# Patient Record
Sex: Male | Born: 1950
Health system: Southern US, Community
[De-identification: ages and names within clinical notes are randomized; demographics above are authoritative.]

## PROBLEM LIST (undated history)

## (undated) DIAGNOSIS — I272 Pulmonary hypertension, unspecified: Secondary | ICD-10-CM

## (undated) DIAGNOSIS — R600 Localized edema: Secondary | ICD-10-CM

## (undated) DIAGNOSIS — Z872 Personal history of diseases of the skin and subcutaneous tissue: Secondary | ICD-10-CM

## (undated) DIAGNOSIS — K219 Gastro-esophageal reflux disease without esophagitis: Secondary | ICD-10-CM

## (undated) DIAGNOSIS — I4891 Unspecified atrial fibrillation: Secondary | ICD-10-CM

## (undated) DIAGNOSIS — N189 Chronic kidney disease, unspecified: Secondary | ICD-10-CM

## (undated) DIAGNOSIS — Z86711 Personal history of pulmonary embolism: Secondary | ICD-10-CM

## (undated) HISTORY — PX: CATARACT EXTRACTION: SUR2

---

## 2015-05-09 ENCOUNTER — Inpatient Hospital Stay (HOSPITAL_COMMUNITY)
Admission: EM | Admit: 2015-05-09 | Discharge: 2015-05-23 | DRG: 871 | Disposition: A | Discharge status: Home / Self Care | Payer: Self-pay | Attending: Internal Medicine | Admitting: Internal Medicine

## 2015-05-09 ENCOUNTER — Encounter (HOSPITAL_COMMUNITY): Payer: Self-pay | Admitting: *Deleted

## 2015-05-09 ENCOUNTER — Inpatient Hospital Stay (HOSPITAL_COMMUNITY): Payer: Self-pay

## 2015-05-09 ENCOUNTER — Emergency Department (HOSPITAL_COMMUNITY): Payer: Self-pay

## 2015-05-09 DIAGNOSIS — K5660 Unspecified intestinal obstruction: Secondary | ICD-10-CM | POA: Diagnosis present

## 2015-05-09 DIAGNOSIS — N19 Unspecified kidney failure: Secondary | ICD-10-CM

## 2015-05-09 DIAGNOSIS — R1114 Bilious vomiting: Secondary | ICD-10-CM

## 2015-05-09 DIAGNOSIS — I959 Hypotension, unspecified: Secondary | ICD-10-CM | POA: Diagnosis present

## 2015-05-09 DIAGNOSIS — F4323 Adjustment disorder with mixed anxiety and depressed mood: Secondary | ICD-10-CM | POA: Diagnosis not present

## 2015-05-09 DIAGNOSIS — T68XXXA Hypothermia, initial encounter: Secondary | ICD-10-CM | POA: Diagnosis present

## 2015-05-09 DIAGNOSIS — R6521 Severe sepsis with septic shock: Secondary | ICD-10-CM | POA: Diagnosis present

## 2015-05-09 DIAGNOSIS — R001 Bradycardia, unspecified: Secondary | ICD-10-CM | POA: Diagnosis present

## 2015-05-09 DIAGNOSIS — L0390 Cellulitis, unspecified: Secondary | ICD-10-CM | POA: Diagnosis present

## 2015-05-09 DIAGNOSIS — R55 Syncope and collapse: Secondary | ICD-10-CM | POA: Diagnosis present

## 2015-05-09 DIAGNOSIS — D649 Anemia, unspecified: Secondary | ICD-10-CM | POA: Diagnosis present

## 2015-05-09 DIAGNOSIS — E871 Hypo-osmolality and hyponatremia: Secondary | ICD-10-CM | POA: Diagnosis present

## 2015-05-09 DIAGNOSIS — K56609 Unspecified intestinal obstruction, unspecified as to partial versus complete obstruction: Secondary | ICD-10-CM

## 2015-05-09 DIAGNOSIS — Z6841 Body Mass Index (BMI) 40.0 and over, adult: Secondary | ICD-10-CM

## 2015-05-09 DIAGNOSIS — E872 Acidosis, unspecified: Secondary | ICD-10-CM | POA: Diagnosis present

## 2015-05-09 DIAGNOSIS — E876 Hypokalemia: Secondary | ICD-10-CM | POA: Diagnosis not present

## 2015-05-09 DIAGNOSIS — L03115 Cellulitis of right lower limb: Secondary | ICD-10-CM | POA: Diagnosis present

## 2015-05-09 DIAGNOSIS — E875 Hyperkalemia: Secondary | ICD-10-CM | POA: Diagnosis present

## 2015-05-09 DIAGNOSIS — Z0189 Encounter for other specified special examinations: Secondary | ICD-10-CM

## 2015-05-09 DIAGNOSIS — R112 Nausea with vomiting, unspecified: Secondary | ICD-10-CM

## 2015-05-09 DIAGNOSIS — R7989 Other specified abnormal findings of blood chemistry: Secondary | ICD-10-CM

## 2015-05-09 DIAGNOSIS — I9589 Other hypotension: Secondary | ICD-10-CM

## 2015-05-09 DIAGNOSIS — Z452 Encounter for adjustment and management of vascular access device: Secondary | ICD-10-CM

## 2015-05-09 DIAGNOSIS — J9600 Acute respiratory failure, unspecified whether with hypoxia or hypercapnia: Secondary | ICD-10-CM

## 2015-05-09 DIAGNOSIS — I517 Cardiomegaly: Secondary | ICD-10-CM | POA: Diagnosis present

## 2015-05-09 DIAGNOSIS — I248 Other forms of acute ischemic heart disease: Secondary | ICD-10-CM | POA: Diagnosis present

## 2015-05-09 DIAGNOSIS — K219 Gastro-esophageal reflux disease without esophagitis: Secondary | ICD-10-CM | POA: Diagnosis present

## 2015-05-09 DIAGNOSIS — N179 Acute kidney failure, unspecified: Secondary | ICD-10-CM

## 2015-05-09 DIAGNOSIS — Z22322 Carrier or suspected carrier of Methicillin resistant Staphylococcus aureus: Secondary | ICD-10-CM

## 2015-05-09 DIAGNOSIS — E870 Hyperosmolality and hypernatremia: Secondary | ICD-10-CM | POA: Diagnosis present

## 2015-05-09 DIAGNOSIS — I4891 Unspecified atrial fibrillation: Secondary | ICD-10-CM | POA: Diagnosis not present

## 2015-05-09 DIAGNOSIS — R778 Other specified abnormalities of plasma proteins: Secondary | ICD-10-CM | POA: Diagnosis present

## 2015-05-09 DIAGNOSIS — R45851 Suicidal ideations: Secondary | ICD-10-CM | POA: Diagnosis not present

## 2015-05-09 DIAGNOSIS — R680 Hypothermia, not associated with low environmental temperature: Secondary | ICD-10-CM | POA: Diagnosis present

## 2015-05-09 DIAGNOSIS — E861 Hypovolemia: Secondary | ICD-10-CM | POA: Diagnosis present

## 2015-05-09 DIAGNOSIS — N170 Acute kidney failure with tubular necrosis: Secondary | ICD-10-CM | POA: Diagnosis present

## 2015-05-09 DIAGNOSIS — A419 Sepsis, unspecified organism: Principal | ICD-10-CM | POA: Diagnosis present

## 2015-05-09 DIAGNOSIS — L03119 Cellulitis of unspecified part of limb: Secondary | ICD-10-CM

## 2015-05-09 DIAGNOSIS — I82403 Acute embolism and thrombosis of unspecified deep veins of lower extremity, bilateral: Secondary | ICD-10-CM

## 2015-05-09 DIAGNOSIS — F322 Major depressive disorder, single episode, severe without psychotic features: Secondary | ICD-10-CM | POA: Clinically undetermined

## 2015-05-09 DIAGNOSIS — K567 Ileus, unspecified: Secondary | ICD-10-CM | POA: Diagnosis present

## 2015-05-09 HISTORY — DX: Gastro-esophageal reflux disease without esophagitis: K21.9

## 2015-05-09 LAB — CBG MONITORING, ED
Glucose-Capillary: 122 mg/dL — ABNORMAL HIGH (ref 65–99)
Glucose-Capillary: 116 mg/dL — ABNORMAL HIGH (ref 65–99)

## 2015-05-09 LAB — LIPID PANEL
CHOL/HDL RATIO: 4.8 ratio
CHOLESTEROL: 121 mg/dL (ref 0–200)
HDL: 25 mg/dL — AB (ref >40)
LDL Cholesterol: 68 mg/dL (ref 0–99)
TRIGLYCERIDES: 142 mg/dL (ref <150)
VLDL: 28 mg/dL (ref 0–40)

## 2015-05-09 LAB — BASIC METABOLIC PANEL
Anion gap: 13 (ref 5–15)
Anion gap: 13 (ref 5–15)
Anion gap: 12 (ref 5–15)
Anion gap: 11 (ref 5–15)
Anion gap: 12 (ref 5–15)
BUN: 158 mg/dL — AB (ref 6–20)
BUN: 147 mg/dL — ABNORMAL HIGH (ref 6–20)
BUN: 144 mg/dL — ABNORMAL HIGH (ref 6–20)
BUN: 137 mg/dL — ABNORMAL HIGH (ref 6–20)
BUN: 136 mg/dL — AB (ref 6–20)
CALCIUM: 9.5 mg/dL (ref 8.9–10.3)
CALCIUM: 8.5 mg/dL — AB (ref 8.9–10.3)
CALCIUM: 8.8 mg/dL — AB (ref 8.9–10.3)
CALCIUM: 8.9 mg/dL (ref 8.9–10.3)
CHLORIDE: 91 mmol/L — AB (ref 101–111)
CO2: 19 mmol/L — ABNORMAL LOW (ref 22–32)
CO2: 18 mmol/L — ABNORMAL LOW (ref 22–32)
CO2: 20 mmol/L — AB (ref 22–32)
CO2: 20 mmol/L — AB (ref 22–32)
CO2: 21 mmol/L — AB (ref 22–32)
CREATININE: 4.76 mg/dL — AB (ref 0.61–1.24)
CREATININE: 4.58 mg/dL — AB (ref 0.61–1.24)
CREATININE: 4.46 mg/dL — AB (ref 0.61–1.24)
Calcium: 8.3 mg/dL — ABNORMAL LOW (ref 8.9–10.3)
Chloride: 79 mmol/L — ABNORMAL LOW (ref 101–111)
Chloride: 90 mmol/L — ABNORMAL LOW (ref 101–111)
Chloride: 89 mmol/L — ABNORMAL LOW (ref 101–111)
Chloride: 87 mmol/L — ABNORMAL LOW (ref 101–111)
Creatinine, Ser: 5.82 mg/dL — ABNORMAL HIGH (ref 0.61–1.24)
Creatinine, Ser: 5.03 mg/dL — ABNORMAL HIGH (ref 0.61–1.24)
GFR calc Af Amer: 11 mL/min — ABNORMAL LOW (ref >60)
GFR calc non Af Amer: 11 mL/min — ABNORMAL LOW (ref >60)
GFR calc non Af Amer: 12 mL/min — ABNORMAL LOW (ref >60)
GFR calc non Af Amer: 12 mL/min — ABNORMAL LOW (ref >60)
GFR calc non Af Amer: 13 mL/min — ABNORMAL LOW (ref >60)
GFR, EST AFRICAN AMERICAN: 13 mL/min — AB (ref >60)
GFR, EST AFRICAN AMERICAN: 14 mL/min — AB (ref >60)
GFR, EST AFRICAN AMERICAN: 14 mL/min — AB (ref >60)
GFR, EST AFRICAN AMERICAN: 15 mL/min — AB (ref >60)
GFR, EST NON AFRICAN AMERICAN: 9 mL/min — AB (ref >60)
GLUCOSE: 123 mg/dL — AB (ref 65–99)
GLUCOSE: 178 mg/dL — AB (ref 65–99)
GLUCOSE: 188 mg/dL — AB (ref 65–99)
Glucose, Bld: 95 mg/dL (ref 65–99)
Glucose, Bld: 124 mg/dL — ABNORMAL HIGH (ref 65–99)
POTASSIUM: 5.1 mmol/L (ref 3.5–5.1)
Potassium: 7.3 mmol/L (ref 3.5–5.1)
Potassium: 4.4 mmol/L (ref 3.5–5.1)
Potassium: 4.4 mmol/L (ref 3.5–5.1)
Potassium: 4.7 mmol/L (ref 3.5–5.1)
SODIUM: 111 mmol/L — AB (ref 135–145)
SODIUM: 122 mmol/L — AB (ref 135–145)
Sodium: 122 mmol/L — ABNORMAL LOW (ref 135–145)
Sodium: 120 mmol/L — ABNORMAL LOW (ref 135–145)
Sodium: 120 mmol/L — ABNORMAL LOW (ref 135–145)

## 2015-05-09 LAB — I-STAT CHEM 8, ED
CREATININE: 6.2 mg/dL — AB (ref 0.61–1.24)
Calcium, Ion: 1.12 mmol/L — ABNORMAL LOW (ref 1.13–1.30)
Chloride: 80 mmol/L — ABNORMAL LOW (ref 101–111)
GLUCOSE: 113 mg/dL — AB (ref 65–99)
HCT: 41 % (ref 39.0–52.0)
HEMOGLOBIN: 13.9 g/dL (ref 13.0–17.0)
Potassium: 6.9 mmol/L (ref 3.5–5.1)
Sodium: 109 mmol/L — CL (ref 135–145)
TCO2: 21 mmol/L (ref 0–100)

## 2015-05-09 LAB — URINALYSIS, ROUTINE W REFLEX MICROSCOPIC
Bilirubin Urine: NEGATIVE
GLUCOSE, UA: NEGATIVE mg/dL
HGB URINE DIPSTICK: NEGATIVE
Ketones, ur: NEGATIVE mg/dL
Nitrite: NEGATIVE
PH: 5 (ref 5.0–8.0)
Protein, ur: NEGATIVE mg/dL
SPECIFIC GRAVITY, URINE: 1.009 (ref 1.005–1.030)

## 2015-05-09 LAB — I-STAT TROPONIN, ED
TROPONIN I, POC: 0.09 ng/mL — AB (ref 0.00–0.08)
TROPONIN I, POC: 0.09 ng/mL — AB (ref 0.00–0.08)

## 2015-05-09 LAB — CBC
HEMATOCRIT: 35.9 % — AB (ref 39.0–52.0)
HEMOGLOBIN: 12.6 g/dL — AB (ref 13.0–17.0)
MCH: 30.7 pg (ref 26.0–34.0)
MCHC: 35.1 g/dL (ref 30.0–36.0)
MCV: 87.6 fL (ref 78.0–100.0)
PLATELETS: 306 10*3/uL (ref 150–400)
RBC: 4.1 MIL/uL — AB (ref 4.22–5.81)
RDW: 13.1 % (ref 11.5–15.5)
WBC: 11.5 10*3/uL — ABNORMAL HIGH (ref 4.0–10.5)

## 2015-05-09 LAB — LACTIC ACID, PLASMA
Lactic Acid, Venous: 2.5 mmol/L (ref 0.5–2.0)
Lactic Acid, Venous: 1.6 mmol/L (ref 0.5–2.0)

## 2015-05-09 LAB — URINE MICROSCOPIC-ADD ON

## 2015-05-09 LAB — OSMOLALITY: Osmolality: 294 mOsm/kg (ref 275–295)

## 2015-05-09 LAB — TROPONIN I
Troponin I: 0.08 ng/mL — ABNORMAL HIGH (ref <0.031)
Troponin I: 0.07 ng/mL — ABNORMAL HIGH (ref <0.031)

## 2015-05-09 LAB — I-STAT CG4 LACTIC ACID, ED
LACTIC ACID, VENOUS: 3.18 mmol/L — AB (ref 0.5–2.0)
Lactic Acid, Venous: 2.49 mmol/L (ref 0.5–2.0)

## 2015-05-09 LAB — PROCALCITONIN: Procalcitonin: 0.19 ng/mL

## 2015-05-09 LAB — MRSA PCR SCREENING: MRSA by PCR: POSITIVE — AB

## 2015-05-09 LAB — OSMOLALITY, URINE: Osmolality, Ur: 299 mOsm/kg — ABNORMAL LOW (ref 300–900)

## 2015-05-09 LAB — SODIUM, URINE, RANDOM: SODIUM UR: 24 mmol/L

## 2015-05-09 LAB — CREATININE, URINE, RANDOM: CREATININE, URINE: 72.98 mg/dL

## 2015-05-09 MED ORDER — CHLORHEXIDINE GLUCONATE CLOTH 2 % EX PADS
6.0000 | MEDICATED_PAD | Freq: Every day | CUTANEOUS | Status: DC
  Administered 2015-05-10 – 2015-05-14 (×4): 6 via TOPICAL

## 2015-05-09 MED ORDER — DEXTROSE 5 % IV SOLN
INTRAVENOUS | Status: DC
  Administered 2015-05-09: 17:00:00 via INTRAVENOUS

## 2015-05-09 MED ORDER — VANCOMYCIN HCL 10 G IV SOLR
2000.0000 mg | Freq: Once | INTRAVENOUS | Status: DC
  Administered 2015-05-09: 2000 mg via INTRAVENOUS
  Filled 2015-05-09: qty 2000

## 2015-05-09 MED ORDER — FUROSEMIDE 10 MG/ML IJ SOLN
40.0000 mg | Freq: Once | INTRAMUSCULAR | Status: AC
  Administered 2015-05-09: 40 mg via INTRAVENOUS
  Filled 2015-05-09: qty 4

## 2015-05-09 MED ORDER — MUPIROCIN 2 % EX OINT
1.0000 "application " | TOPICAL_OINTMENT | Freq: Two times a day (BID) | CUTANEOUS | Status: AC
  Administered 2015-05-09 – 2015-05-14 (×10): 1 via NASAL
  Filled 2015-05-09: qty 22

## 2015-05-09 MED ORDER — MAGNESIUM HYDROXIDE 400 MG/5ML PO SUSP: 30.0000 mL | Freq: Once | ORAL | Status: DC

## 2015-05-09 MED ORDER — ASPIRIN 81 MG PO CHEW
324.0000 mg | CHEWABLE_TABLET | ORAL | Status: AC
  Administered 2015-05-09: 324 mg via ORAL
  Filled 2015-05-09: qty 4

## 2015-05-09 MED ORDER — DEXTROSE 50 % IV SOLN
2.0000 | Freq: Once | INTRAVENOUS | Status: AC
  Administered 2015-05-09: 50 mL via INTRAVENOUS
  Filled 2015-05-09: qty 100

## 2015-05-09 MED ORDER — DEXTROSE 5 % IV SOLN
2.0000 g | INTRAVENOUS | Status: AC
  Administered 2015-05-09 – 2015-05-18 (×10): 2 g via INTRAVENOUS
  Filled 2015-05-09 (×10): qty 2

## 2015-05-09 MED ORDER — PIPERACILLIN-TAZOBACTAM 3.375 G IVPB 30 MIN
3.3750 g | Freq: Once | INTRAVENOUS | Status: AC
  Administered 2015-05-09: 3.375 g via INTRAVENOUS
  Filled 2015-05-09: qty 50

## 2015-05-09 MED ORDER — SODIUM CHLORIDE 0.9 % IV BOLUS (SEPSIS)
500.0000 mL | INTRAVENOUS | Status: DC
  Administered 2015-05-09 (×3): 500 mL via INTRAVENOUS

## 2015-05-09 MED ORDER — SODIUM CHLORIDE 0.9 % IV SOLN: 250.0000 mL | INTRAVENOUS | Status: DC | PRN

## 2015-05-09 MED ORDER — PIPERACILLIN-TAZOBACTAM IN DEX 2-0.25 GM/50ML IV SOLN
2.2500 g | Freq: Four times a day (QID) | INTRAVENOUS | Status: DC
  Filled 2015-05-09: qty 50

## 2015-05-09 MED ORDER — ONDANSETRON HCL 4 MG/2ML IJ SOLN
4.0000 mg | Freq: Four times a day (QID) | INTRAMUSCULAR | Status: DC | PRN
  Administered 2015-05-11 (×3): 4 mg via INTRAVENOUS
  Filled 2015-05-09 (×3): qty 2

## 2015-05-09 MED ORDER — SODIUM POLYSTYRENE SULFONATE 15 GM/60ML PO SUSP
45.0000 g | Freq: Once | ORAL | Status: AC
  Administered 2015-05-09: 45 g via ORAL
  Filled 2015-05-09: qty 180

## 2015-05-09 MED ORDER — ACETAMINOPHEN 325 MG PO TABS
650.0000 mg | ORAL_TABLET | ORAL | Status: DC | PRN
  Administered 2015-05-10 – 2015-05-19 (×4): 650 mg via ORAL
  Filled 2015-05-09 (×4): qty 2

## 2015-05-09 MED ORDER — HEPARIN SODIUM (PORCINE) 5000 UNIT/ML IJ SOLN
5000.0000 [IU] | Freq: Three times a day (TID) | INTRAMUSCULAR | Status: DC
  Administered 2015-05-09 – 2015-05-23 (×41): 5000 [IU] via SUBCUTANEOUS
  Filled 2015-05-09 (×41): qty 1

## 2015-05-09 MED ORDER — ASPIRIN 300 MG RE SUPP: 300.0000 mg | RECTAL | Status: AC

## 2015-05-09 MED ORDER — SODIUM CHLORIDE 0.9 % IV BOLUS (SEPSIS)
1000.0000 mL | INTRAVENOUS | Status: DC
  Administered 2015-05-09 (×3): 1000 mL via INTRAVENOUS

## 2015-05-09 MED ORDER — PANTOPRAZOLE SODIUM 40 MG IV SOLR
40.0000 mg | INTRAVENOUS | Status: DC
  Administered 2015-05-09: 40 mg via INTRAVENOUS
  Filled 2015-05-09: qty 40

## 2015-05-09 MED ORDER — INSULIN ASPART 100 UNIT/ML IV SOLN
10.0000 [IU] | Freq: Once | INTRAVENOUS | Status: AC
  Administered 2015-05-09: 10 [IU] via INTRAVENOUS
  Filled 2015-05-09: qty 1

## 2015-05-09 MED ORDER — ALUM & MAG HYDROXIDE-SIMETH 200-200-20 MG/5ML PO SUSP
30.0000 mL | Freq: Once | ORAL | Status: AC
  Administered 2015-05-09: 30 mL via ORAL
  Filled 2015-05-09: qty 30

## 2015-05-09 MED ORDER — SODIUM CHLORIDE 0.9 % IV SOLN
INTRAVENOUS | Status: DC
  Administered 2015-05-09: 100 mL/h via INTRAVENOUS

## 2015-05-09 MED ORDER — SODIUM CHLORIDE 0.9 % IV SOLN
INTRAVENOUS | Status: DC
  Administered 2015-05-09: 18:00:00 via INTRAVENOUS

## 2015-05-09 MED ORDER — SODIUM CHLORIDE 0.9 % IV SOLN
2.0000 g | INTRAVENOUS | Status: AC
  Administered 2015-05-09: 2 g via INTRAVENOUS
  Filled 2015-05-09: qty 20

## 2015-05-09 NOTE — Progress Notes (Signed)
CSW has made APS report to [place] re: pt's father, Brayten Langa, who is presently at bedside.  APS has said that they "won't be coming out tonight" but will review referral and f/u as appropriate.  Unfortunately, CSW has reached out to pt's other sons who were unwilling to do anything to assist Mr. Craigo and encouraged an APS referral.  Pt was unable to identify anyone who may be able to assist him with the care of his father.  Unit CSW will be notified to f/u with APS in the am.  CSW discussed situation with Chester Holstein, RN.  Creta Levin, LCSW Evening/ED CSW [phone]

## 2015-05-09 NOTE — ED Notes (Signed)
Placed a 16" foley into pt.was handle very well.urine return flowed well.800cc of urine from foley.

## 2015-05-09 NOTE — Progress Notes (Signed)
CSW received the following numbers from Patient:  Thompson Grayer) [place]- [phone] Jorrel Cripe- [phone]  CSW engaged with [name] [place] who reports that his older brother Toney Reil) is his father's POA and has all of his money and Mr. Yonker reports that his father doesn't want to have anything to do with him at this time and he wants to grant his father his wishes. He reports that he does not have a contact number or address for [employer] CSW contacted Mr. Toney Reil who reports that he is out of town and would be unable to come and get him and suggested that social services get involved. CSW will staff with night shift ED social worker for APS report.   [name] [facility] ED/ [place] Social Worker [phone]

## 2015-05-09 NOTE — H&P (Signed)
PULMONARY / CRITICAL CARE MEDICINE   Name: Scott Shelton MRN: [id number] DOB: 30-Mar-1951    ADMISSION DATE:  05/09/2015 CONSULTATION DATE:  05/09/2015  REFERRING MD:  Dr. Claudine Mouton EDP  CHIEF COMPLAINT:  syncope  HISTORY OF PRESENT ILLNESS:   65 year old male with no significant past medical history. He has neglected his own medical care as he is the primary caregiver for his father who has advanced dementia. His health for the most part has been "pretty good" with the exception of a cataract surgery. About one year ago he developed small red punctate lesions to bilateral lower extremities from the calves down. These lesions began to spread and overwhelmed his the entirety of this area. He managed this himself without medical attention, but it seems like he has not been doing much for them. Over the past few weeks he has complained on increased DOE with simple tasks that would have been easy for him before. Denies cough, chest pain, and SOB at rest. 1/31 he was standing next to the couch suffered a syncopal episode during which he fell back onto the couch into a seated position. He awoke spontaneously about 3-4 minutes later. He presented to ED with this complaint. In the emergency department he was found to be hypotensive 94/80 and hypothermic 96.58F. Laboratory evaluation significant for Creatinine 5.82, K 7.3, Na 109, Cl 79. Also had some mild leukocytosis with WBC 11.5. Seen by nephrology in ED, PCCM to admit.  PAST MEDICAL HISTORY :  He  has no past medical history on file.  PAST SURGICAL HISTORY: He  has past surgical history that includes Cataract extraction.  No Known Allergies  No current facility-administered medications on file prior to encounter.   No current outpatient prescriptions on file prior to encounter.    FAMILY HISTORY:  His has no family status information on file.   SOCIAL HISTORY: He  reports that he has never smoked. He does not have any smokeless tobacco history on  file. He reports that he does not drink alcohol or use illicit drugs.  REVIEW OF SYSTEMS:   Bolds are positive  Constitutional: weight loss, gain, night sweats, Fevers, chills, fatigue .  HEENT: headaches, Sore throat, sneezing, nasal congestion, post nasal drip, Difficulty swallowing, Tooth/dental problems, visual complaints visual changes, ear ache CV:  chest pain, radiates: ,Orthopnea, PND, swelling in lower extremities, dizziness, palpitations, syncope.  GI  heartburn, indigestion, abdominal pain, nausea, vomiting, diarrhea, change in bowel habits, loss of appetite, bloody stools.  Resp: cough, productive: , hemoptysis, dyspnea on exertion, chest pain, pleuritic.  Skin: rash or itching or icterus GU: dysuria, change in color of urine, urgency or frequency. flank pain, hematuria  MS: joint pain or swelling. decreased range of motion  Psych: change in mood or affect. depression or anxiety.  Neuro: difficulty with speech, weakness, numbness, ataxia   SUBJECTIVE:    VITAL SIGNS: BP 95/52 mmHg  Pulse 65  Temp(Src) 96.8 F (36 C) (Rectal)  Resp 17  SpO2 99%  HEMODYNAMICS:    VENTILATOR SETTINGS:    INTAKE / OUTPUT:    PHYSICAL EXAMINATION: General:  Obese male in NAD Neuro:  Alert, oriented, non-focal HEENT:  [place]/AT, PERRL, no JVD Cardiovascular:  RRR, no MRG Lungs:  Clear bilateral breath sounds, mildly increased WOB Abdomen:  Soft, non-tender, non-distended Musculoskeletal:  No acute deformity Skin:  LLE edematous and dry. RLE edematous, erythema, areas of ulceration.  LABS:  BMET  Recent Labs Lab 05/09/15 1000 05/09/15 1038  NA  111* 109*  K 7.3* 6.9*  CL 79* 80*  CO2 19*  --   BUN 158* >140*  CREATININE 5.82* 6.20*  GLUCOSE 123* 113*    Electrolytes  Recent Labs Lab 05/09/15 1000  CALCIUM 9.5    CBC  Recent Labs Lab 05/09/15 1000 05/09/15 1038  WBC 11.5*  --   HGB 12.6* 13.9  HCT 35.9* 41.0  PLT 306  --     Coag's No results for  input(s): APTT, INR in the last 168 hours.  Sepsis Markers  Recent Labs Lab 05/09/15 1014  LATICACIDVEN 3.18*    ABG No results for input(s): PHART, PCO2ART, PO2ART in the last 168 hours.  Liver Enzymes No results for input(s): AST, ALT, ALKPHOS, BILITOT, ALBUMIN in the last 168 hours.  Cardiac Enzymes No results for input(s): TROPONINI, PROBNP in the last 168 hours.  Glucose  Recent Labs Lab 05/09/15 1001 05/09/15 1239  GLUCAP 122* 116*    Imaging Dg Chest 2 View  05/09/2015  CLINICAL DATA:  Speech syncope, lightheadedness EXAM: CHEST  2 VIEW COMPARISON:  None. FINDINGS: There is mild elevation of the right diaphragm. There is no focal parenchymal opacity. There is no pleural effusion or pneumothorax. The heart and mediastinal contours are unremarkable. The osseous structures are unremarkable. IMPRESSION: No active cardiopulmonary disease. Electronically Signed   By: Kathreen Devoid   On: 05/09/2015 12:30     STUDIES:  Renal US 1/31 >>>  CULTURES: Blood 1/31 >>> Urine 1/31 >>>  ANTIBIOTICS: Zosyn/Vanc in ED 1/31 Rocephin 1/31 >>>  SIGNIFICANT EVENTS: 1/31 admit  LINES/TUBES:   DISCUSSION: 65 year old male with no PMH. 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 1 year history of BLE edema and wounds. Suffered syncopal episode 1/31 and found to be hypotensive with acute renal failure, hypernatremia, hyperkalemia. Admit to PCCM for medical management, nephrology seeing and helping with metabolic derangements.   ASSESSMENT / PLAN:  PULMONARY A: No acute issues  P:   Supplemental O2 as indicated to keep SpO2 > 92% Incentive spirometry  CARDIOVASCULAR A:  Hypotension - hypovolemia vs severe sepsis  P:  Telemetry Volume resuscitation per nephrology Repeat lactic acid MAP goal > 69mmHg Trend troponin  RENAL A:   Acute renal failure Hyperkalemia (given insulin, calcium, lasix, and kayexalate in  ED) Hyponatremia chronic, hypovolemic NAG acidosis  P:   Nephrology following NS 100/hr (d/w nephro) Serial BMP q 2 hours.  Goal for < 2 mmol/L increase in Na per 2 hours. Correct potassium as indicated Serum/urine osm pending Renal US pending  GASTROINTESTINAL A:   No acute issues  P:   Protonix for SUP  Renal diet  HEMATOLOGIC A:   Anemia in setting renal failure  P:  Follow CBC SQ heparin for VTE ppx  INFECTIOUS A:   Severe sepsis secondary to RLE cellulitis  P:   Cultures and ABX as above (rocephin) PCT algorithm  ENDOCRINE A:   No acute issues  P:   Follow glucose on BMP  NEUROLOGIC A:   No acute issues  P:   RASS goal: 0 Monitor  GLOBAL: Patient has father of whom he is primary caregiver. Father is here in hospital and there is no one to keep and eye on him. Will consult social work.   FAMILY  - Updates: patient updated in ED by [facility], [facility]  - Inter-disciplinary family meet or Palliative Care meeting due by:  2/6  Georgann Housekeeper, AGACNP-BC [place] Pulmonology/Critical Care Pager [phone] or [phone]  05/09/2015 1:27 PM    Attending Note:  I have examined patient, reviewed labs, studies and notes. I have discussed the case with Jaclynn Guarneri, and I agree with the data and plans as amended above. Pt suffered a syncopal episode today. States that he has been progressively weak, has had erythema and swelling in LE's. Brought to ED and found to be hypotensive, labs show metabolic disarray - hyperK, hypoNa, acute renal failure. On exam he has markedly edematous LE's to the knees with redness on the R, suggestive of a cellulitis. he will be admitted to ICU for frequent labs, antibiotics, CAREFUL voilume resuscitation. Independent critical care time is 35 minutes.   Baltazar Apo, MD, PhD 05/09/2015, 5:00 PM [place] [facility] [phone] or if no answer [phone]

## 2015-05-09 NOTE — Progress Notes (Signed)
Pharmacy Antibiotic Note  Scott Shelton is a 65 y.o. male admitted on 05/09/2015 with cellulitis.  Pharmacy has been consulted for vancomycin and zosyn dosing. WBC is elevated and Scr is elevated but unclear baseline.   Plan: - Vanc 2gm IV x 1 then f/u Scr trend to determine appropriate interval - Zosyn 3.375gm IV x 1 then 2.25gm IV Q6H - F/u renal fxn, C&S, clinical status and trough at [facility]     No data recorded.   Recent Labs Lab 05/09/15 1000 05/09/15 1014 05/09/15 1038  WBC 11.5*  --   --   CREATININE 5.82*  --  6.20*  LATICACIDVEN  --  3.18*  --     CrCl cannot be calculated (Unknown ideal weight.).    No Known Allergies  Antimicrobials this admission: Vanc 1/31>> Zosyn 1/31>>  Dose adjustments this admission: N/A  Microbiology results: Pending  Thank you for allowing pharmacy to be a part of this patient's care.  Gino Garrabrant, Rande Lawman 05/09/2015 10:57 AM

## 2015-05-09 NOTE — ED Notes (Addendum)
Pt in from home c/o witnessed 3-4 min syncopal episode with R non radiating CP post syncopal episode , pt denies current CP, reports general Malaise, A&O x4, follows commands, speaks in complete sentences, pt reports SOB with exertion, pt has bil leg swelling with bil wounds, pt reports fever, pt rcvd 324 mg ASA pta

## 2015-05-09 NOTE — Progress Notes (Signed)
CSW attempted brother listed as Patient's emergency contact [name] [phone]) and left voice message. CSW will inquire about other working numbers to try.   Scott Shelton [facility] ED/ [place] Social Worker [phone]

## 2015-05-09 NOTE — Consult Note (Signed)
CARDIOLOGY CONSULT NOTE   Patient ID: Arthur Mickley MRN: [id number] DOB/AGE: December 15, 1950 65 y.o.  Admit date: 05/09/2015  Requesting Physician: [facility] ED Primary Physician   No primary care provider on file. Primary Cardiologist   New Reason for Consultation   syncope  HPI: Aureliano Gosney is a 65 y.o. male with a non contributory past medical history who presented to [facility] on 05/09/15 after a syncopal episode. In the ED he was noted to be hypotensive, hypothermic, hyperkalemic and in acute renal failure with lactic acidosis. Troponin mildly elevated at 0.09.   Patient states that earlier today he was sitting on the couch when tried to reposition himself he passed out for approximately 3-4 minutes.  Notes that he felt fatigue right before he tried to reposition but had no other associated symptoms. Denies palpitations, chest pain, SOB, lightheadedness, nausea, and diaphoresis. After he regained consciousness, he called 911.   Patient lives at home and is the primary caregiver for his father who suffers from dementia. Due to this, the patient has neglected visiting a PCP and is on no medications. 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 Claims that he uses triple antibiotic ointment on his legs for treatment. Patient also notes that for the past three weeks he has been experiencing worsening dyspnea on exertion intermittently. Notes that when he walks from his apartment to his [place]i (approximately 100 feet) he has to stop for a few minutes to catch his breath. Also endorses orthopnea, notes that he sleeps at 90 degrees. Patient admitted further work up and cardiology consulted for syncope.   Currently, patient denies lightheadedness, chest pain, SOB, and palpitations.      Past Medical History  Diagnosis Date  . GERD (gastroesophageal reflux disease)      Past Surgical History    Procedure Laterality Date  . Cataract extraction      No Known Allergies  I have reviewed the patient's current medications . heparin  5,000 Units Subcutaneous 3 times per day  . pantoprazole (PROTONIX) IV  40 mg Intravenous Q24H   . sodium chloride    . sodium chloride 100 mL/hr (05/09/15 1457)  . cefTRIAXone (ROCEPHIN)  IV 2 g (05/09/15 1456)   sodium chloride, acetaminophen, ondansetron (ZOFRAN) IV  Prior to Admission medications   Medication Sig Start Date End Date Taking? Authorizing Provider  aluminum-magnesium hydroxide-simethicone (MAALOX) I037812 MG/5ML SUSP Take 30 mLs by mouth 3 (three) times daily as needed (acid reflux). Acid reflux   Yes Historical Provider, MD     Social History   Social History  . Marital Status: Single    Spouse Name: N/A  . Number of Children: N/A  . Years of Education: N/A   Occupational History  . Not on file.   Social History Main Topics  . Smoking status: Never Smoker   . Smokeless tobacco: Not on file  . Alcohol Use: No  . Drug Use: No  . Sexual Activity: Not on file   Other Topics Concern  . Not on file   Social History Narrative  . No narrative on file    No family status information on file.   No family history on file.   ROS:  Full 14 point review of systems complete and found to be negative unless listed above.  Physical Exam: Blood pressure 98/55, pulse 65, temperature 96.8 F (36 C), temperature source Rectal, resp.  rate 14, SpO2 100 %.  General: Obese male appearing uncomfortable. Head: Eyes PERRLA, No xanthomas.   Normocephalic and atraumatic, oropharynx without edema or exudate.   Lungs: CTAB Heart: HRRR S1 S2, no rub/gallop/murmer.  pulses are 2+ in upper extremities. DP/PT pulses not palpable due to edematous state of lower extremities below knees b/l.  Neck: No carotid bruits. No lymphadenopathy. No  JVD. Abdomen: Bowel sounds present, abdomen soft and non-tender without masses or hernias noted. Msk:   No spine or cva tenderness. No weakness, no joint deformities or effusions. Extremities: LLE edematous and dry. RLE edematous, erythema, areas of ulceration. Neuro: Alert and oriented X 3. No focal deficits noted. Psych:  Good affect, responds appropriately  Labs:   Lab Results  Component Value Date   WBC 11.5* 05/09/2015   HGB 13.9 05/09/2015   HCT 41.0 05/09/2015   MCV 87.6 05/09/2015   PLT 306 05/09/2015   No results for input(s): INR in the last 72 hours.   Recent Labs Lab 05/09/15 1000 05/09/15 1038  NA 111* 109*  K 7.3* 6.9*  CL 79* 80*  CO2 19*  --   BUN 158* >140*  CREATININE 5.82* 6.20*  CALCIUM 9.5  --   GLUCOSE 123* 113*    Recent Labs  05/09/15 1012 05/09/15 1500  TROPIPOC 0.09* 0.09*    Echo: pending   ECG:  HR 66. Sinus rhythm with PVC. RBBB. Prolonged PR interval. Peaked Twaves.    Radiology:  Dg Chest 2 View  05/09/2015  CLINICAL DATA:  Speech syncope, lightheadedness EXAM: CHEST  2 VIEW COMPARISON:  None. FINDINGS: There is mild elevation of the right diaphragm. There is no focal parenchymal opacity. There is no pleural effusion or pneumothorax. The heart and mediastinal contours are unremarkable. The osseous structures are unremarkable. IMPRESSION: No active cardiopulmonary disease. Electronically Signed   By: Kathreen Devoid   On: 05/09/2015 12:30    ASSESSMENT AND PLAN:    Active Problems:   AKI (acute kidney injury) ([place])   Hyperkalemia   Lactic acidosis   Hypotension   Hypothermia   Hyponatremia   Syncope  Jarome Mazor is a 65 y.o. male with a non contributory past medical history who presented to [facility] on 05/09/15 after a syncopal episode. In the ED he was noted to be hypotensive, hypothermic, hyperkalemic and in acute renal failure with lactic acidosis.   Syncope: probably 2/2 hypotension and metabolic derangements. Monitor closely on telemetry.   Elevated troponin: 1st troponin barely elevated at 0.09. Will continue to cycle enzymes.  Likely demand in the setting of severe sepsis.  Hyperkalemia: K 7.3--> 6.9. ECG with peaked T Waves. Given Calcium gluconate, insulin, lasix and Kayexelate. Per PCCM  AKI and Metabolic derangements: hyperkalemia and hyponatremia. Creat 6.20. Nephrology following  Hypotension: getting IVFs. Volume resuscitation per nephrology  Severe sepsis secondary to RLE cellulitis: Cultures and ABX as above (rocephin). Per PCCM  Signed: Eileen Stanford, PA-C 05/09/2015 3:26 PM  Pager [phone]  Co-Sign MD

## 2015-05-09 NOTE — Progress Notes (Signed)
PCCM INTERVAL PROGRESS NOTE  Follow up BMP reviewed. Sodium with sharp increase from 111 to 122 after initial volume resuscitation in ED where he had received 4.5 liters NS.  A: Hyponatremia  P: Will change from NS to D5W at 137ml/Hr in attempt to control rate of Na increase to 0.5 mmol/L per hour.   Georgann Housekeeper, AGACNP-BC [facility] Pulmonology/Critical Care Pager [phone] or [phone]  05/09/2015 4:27 PM

## 2015-05-09 NOTE — ED Notes (Signed)
2 [place] informed that the pt will be transported upstairs when he returns from [place]

## 2015-05-09 NOTE — ED Notes (Signed)
Pt presents to ED with bil leg dressings taped in place with bags tied around the dressings, the dressings on bil lower extremities are saturated with serosanguinous drainage, pts wounds are odorous with large amt of drainage present

## 2015-05-09 NOTE — ED Provider Notes (Signed)
CSN: [id number]     Arrival date & time 05/09/15  0945 History   First MD Initiated Contact with Patient 05/09/15 [phone]     Chief Complaint  Patient presents with  . Loss of Consciousness     (Consider location/radiation/quality/duration/timing/severity/associated sxs/prior Treatment) Patient is a 65 y.o. male presenting with syncope.  Loss of Consciousness Associated symptoms: chest pain, dizziness, fever (subjective) and shortness of breath   Associated symptoms: no nausea and no vomiting    Scott Shelton is a 65 y.o. male with no significant PMH who presents via EMS after 3-4 minute syncopal episodes just PTA.  Patient reports he was sitting on the couch and attempting to change positions when he suddenly felt dizzy and lightheaded and "passed out" for about 3-4 minutes.  This was unwitnessed.  He reports he spontaneously awoke and dialed 911.  He reports no other prodromal symptoms.  He endorses left sided non-radiating CP just after the event, but denies any CP now.  No recent changes to medications.  This has never happened before.  He received 324 ASA per EMS en route.  Associated symptoms include DOE, bilateral lower extremity edema and wounds, subjective fever, and unilateral leg swelling (L>R).  Denies current CP, cough, abdominal pain, N/V, or urinary symptoms.  He denies any known cardiac history.  No hx of PE/DVT, recent surgery/trauma.  He reports he has been walking up until recently because his lower extremity swelling and painful wounds.  He states he has been dressing and caring for these wounds for the past year, but has not seen a doctor or sought treatment.  History reviewed. No pertinent past medical history. Past Surgical History  Procedure Laterality Date  . Cataract extraction     No family history on file. Social History  Substance Use Topics  . Smoking status: Never Smoker   . Smokeless tobacco: None  . Alcohol Use: No    Review of Systems  Constitutional:  Positive for fever (subjective). Negative for chills.  Respiratory: Positive for shortness of breath.   Cardiovascular: Positive for chest pain, leg swelling and syncope.  Gastrointestinal: Negative for nausea, vomiting and abdominal pain.  Genitourinary: Negative for dysuria, frequency and hematuria.  Skin: Positive for color change and wound.  Neurological: Positive for dizziness, syncope and light-headedness.  All other systems reviewed and are negative.     Allergies  Review of patient's allergies indicates no known allergies.  Home Medications   Prior to Admission medications   Medication Sig Start Date End Date Taking? Authorizing Provider  aluminum-magnesium hydroxide-simethicone (MAALOX) [id number] MG/5ML SUSP Take 30 mLs by mouth 3 (three) times daily as needed (acid reflux). Acid reflux   Yes Historical Provider, MD   BP 101/47 mmHg  Pulse 65  Resp 16  SpO2 99% Physical Exam  Constitutional: He is oriented to person, place, and time. He appears well-developed and well-nourished.  Foul odor coming from dressings.  HENT:  Head: Normocephalic and atraumatic.  Mouth/Throat: Oropharynx is clear and moist.  Eyes: Conjunctivae are normal. Pupils are equal, round, and reactive to light.  Neck: Normal range of motion. Neck supple.  Cardiovascular: Regular rhythm and normal heart sounds.  Bradycardia present.   No murmur heard. Bilateral 2+ pitting edema of lower extremities.   Pulmonary/Chest: Effort normal and breath sounds normal. No accessory muscle usage or stridor. No respiratory distress. He has no wheezes. He has no rhonchi. He has no rales.  Abdominal: Soft. Bowel sounds are normal. He exhibits no distension.  There is no tenderness.  Musculoskeletal: Normal range of motion.  Lymphadenopathy:    He has no cervical adenopathy.  Neurological: He is alert and oriented to person, place, and time.  Speech clear without dysarthria.  Strength and sensation intact  bilaterally throughout lower extremities.   Skin: Skin is warm and dry.  See pictures below.  Psychiatric: He has a normal mood and affect. His behavior is normal.        ED Course  Procedures (including critical care time)  CRITICAL CARE Performed by: Gloriann Loan   Total critical care time: 30 minutes  Critical care time was exclusive of separately billable procedures and treating other patients.  Critical care was necessary to treat or prevent imminent or life-threatening deterioration.  Critical care was time spent personally by me on the following activities: development of treatment plan with patient and/or surrogate as well as nursing, discussions with consultants, evaluation of patient's response to treatment, examination of patient, obtaining history from patient or surrogate, ordering and performing treatments and interventions, ordering and review of laboratory studies, ordering and review of radiographic studies, pulse oximetry and re-evaluation of patient's condition.  Labs Review Labs Reviewed  BASIC METABOLIC PANEL - Abnormal; Notable for the following:    Sodium 111 (*)    Potassium 7.3 (*)    Chloride 79 (*)    CO2 19 (*)    Glucose, Bld 123 (*)    BUN 158 (*)    Creatinine, Ser 5.82 (*)    GFR calc non Af Amer 9 (*)    GFR calc Af Amer 11 (*)    All other components within normal limits  CBC - Abnormal; Notable for the following:    WBC 11.5 (*)    RBC 4.10 (*)    Hemoglobin 12.6 (*)    HCT 35.9 (*)    All other components within normal limits  CBG MONITORING, ED - Abnormal; Notable for the following:    Glucose-Capillary 122 (*)    All other components within normal limits  I-STAT TROPOININ, ED - Abnormal; Notable for the following:    Troponin i, poc 0.09 (*)    All other components within normal limits  I-STAT CG4 LACTIC ACID, ED - Abnormal; Notable for the following:    Lactic Acid, Venous 3.18 (*)    All other components within normal limits    I-STAT CHEM 8, ED - Abnormal; Notable for the following:    Sodium 109 (*)    Potassium 6.9 (*)    Chloride 80 (*)    BUN >140 (*)    Creatinine, Ser 6.20 (*)    Glucose, Bld 113 (*)    Calcium, Ion 1.12 (*)    All other components within normal limits  URINE CULTURE  CULTURE, BLOOD (ROUTINE X 2)  CULTURE, BLOOD (ROUTINE X 2)  URINALYSIS, ROUTINE W REFLEX MICROSCOPIC (NOT AT [facility])  POCT CBG (FASTING - GLUCOSE)-MANUAL ENTRY    Imaging Review No results found. I have personally reviewed and evaluated these images and lab results as part of my medical decision-making.   EKG Interpretation   Date/Time:  Tuesday May 09 2015 09:52:06 EST Ventricular Rate:  66 PR Interval:  235 QRS Duration: 137 QT Interval:  421 QTC Calculation: 441 R Axis:   -67 Text Interpretation:  Sinus rhythm Ventricular premature complex Prolonged  PR interval Inferior infarct, acute Anteroseptal infarct, age  indeterminate No old tracing to compare Confirmed by Glynn Octave  [phone]) on 05/09/2015  10:02:19 AM      MDM   Final diagnoses:  Hyperkalemia  Elevated troponin  Cellulitis of lower extremity, unspecified laterality  Syncope, unspecified syncope type  Renal failure  Hyponatremia    Patient presents with syncopal episode just prior to arrival.  On arrival, patient found to be hypotensive and bradycardic, afebrile, and unlabored respirations without hypoxia.  On exam, heart sounds normal, lungs CTAB, abdomen soft and benign.  Bilateral wounds on lower extremities with erythema and drainage.  See photos above. Strength and sensation intact throughout bilateral lower extremities.  Unable to appreciate DP pulses due to edema. Cardiac and infectious workups initiated.   CBC remarkable for mild leukocytosis of 11.5. Hemoglobin 12.6. BMP remarkable for elevated potassium at 7.3, sodium 111 and serum creatinine of 5.82 and BUN of 158. Patient given D50, NovoLog, calcium gluconate, and  Lasix for hyperkalemia. Troponin elevated at 0.09 as well as lactic acid at 3.18. Cardiology has been consulted. Nephrology has been consulted. Patient given IV Vanc and Zosyn as well as weight-based fluids for cellulitis. Intensivist consulted, Dr. Lamonte Sakai will admit to ICU for hyponatremia, hyperkalemia, renal failure, and serial troponins.   Case has been discussed with and seen by Dr. Claudine Mouton who agrees with the above plan for admission.       Gloriann Loan, PA-C 05/09/15 [place], MD 05/09/15 2228

## 2015-05-09 NOTE — ED Notes (Signed)
Unable to obtain actual wt on pt, this RN, Tonia Ghent, NT & [place] charge RN attempted to weigh the pt on the stretcher, the stretcher & pt would not fit on our scale, Pt will need to be weighed on inpt bed upon arrival to floor

## 2015-05-09 NOTE — Progress Notes (Signed)
Pharmacy Code Sepsis Protocol  Time of code sepsis page: 1029 [x]  Antibiotics delivered at 1040 []  Antibiotics administered prior to code at  (if checked, omit next 2 questions)  Were antibiotics ordered at the time of the code sepsis page? Yes Was it required to contact the physician? [x]  Physician not contacted []  Physician contacted to order antibiotics for code sepsis []  Physician contacted to recommend changing antibiotics  Pharmacy consulted for: vancomycin + zosyn  Anti-infectives    Start     Dose/Rate Route Frequency Ordered Stop   05/09/15 1045  piperacillin-tazobactam (ZOSYN) IVPB 3.375 g     3.375 g 100 mL/hr over 30 Minutes Intravenous  Once 05/09/15 1035     05/09/15 1045  vancomycin (VANCOCIN) 2,000 mg in sodium chloride 0.9 % 500 mL IVPB     2,000 mg 250 mL/hr over 120 Minutes Intravenous  Once 05/09/15 1036          Nurse education provided: []  Minutes left to administer antibiotics to achieve 1 hour goal [x]  Correct order of antibiotic administration []  Antibiotic Y-site compatibilities     Ison Wichmann, Rande Lawman, PharmD 05/09/2015, 10:41 AM

## 2015-05-09 NOTE — Progress Notes (Signed)
CSW received consult due to Patient's father having no where to go as Patient is being admitted to the hospital. Patient reports that his 63 year old father has alzheimer's and he is his primary care giver. Patient reports that he has two other brothers who are feuding with him and will not pick his father up. Patient agreed to allow CSW to reach out to brothers to see if she can get them to come and pick their father up. If brother's refuse to pick patient up, Adult Protective Services will be called. CSW will continue to follow.    [name] [facility] ED/ [place] Social Worker [phone]

## 2015-05-09 NOTE — ED Notes (Signed)
Pts father is at the bedside, pt states, "I am his caregiver." pts father provided bag lunch

## 2015-05-09 NOTE — Consult Note (Signed)
Reason for Consult: Acute renal failure, hyperkalemia Referring Physician: Everlene Balls M.D.   HPI: 65 year old Caucasian man with unremarkable past medical history except for GERD type symptoms treated with over-the-counter Maalox.  He does endorse some problems with his lower extremity skin changes for the past year and a right leg ulcer for the past 6 months that he has been managing himself without any medical supervision. He presented to the emergency room today after having suffered a syncopal episode-found himself lying down on the sofa about 5 minutes after he was standing next to it and passed out. He had some associated chest pain. Endorses a dry cough without any sputum production, fever or chills. Denies any nausea, vomiting or diarrhea but reports poor oral intake for the past 3-4 days. Denies any changes with urine output and specifically denies any obstructive or irritative urinary symptoms including hematuria or flank pain.  He denies any prior history of renal disease, denies history of blood transfusions, denies history of recurrent urinary tract infections or history of nephrolithiasis. He intermittently takes NSAIDs for leg pain-last took ibuprofen 400 mg last week. He denies any recent iodinated intravenous contrast exposure. Denies any recent antibiotic use. Denies any recent skin rash beyond the skin changes on his lower extremities. Denies any recurrent URI/sinusitis/epistaxis symptoms.  Denies any strong family history of kidney disease.   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  In the emergency room, he was noted to have an initial potassium of 7.3/sodium 111 with a BUN of 158 and creatinine of 5.8. Recheck showed potassium of 6.9, sodium 109 with a BUN of >140and creatinine 6.2.    History reviewed. No pertinent past medical history.  Past Surgical History   Procedure Laterality Date  . Cataract extraction      No family history on file.  Social History:  reports that he has never smoked. He does not have any smokeless tobacco history on file. He reports that he does not drink alcohol or use illicit drugs.  Allergies: No Known Allergies  Medications:  Scheduled: . sodium polystyrene  45 g Oral Once    BMP Latest Ref Rng 05/09/2015 05/09/2015  Glucose 65 - 99 mg/dL 113(H) 123(H)  BUN 6 - 20 mg/dL >140(H) 158(H)  Creatinine 0.61 - 1.24 mg/dL 6.20(H) 5.82(H)  Sodium 135 - 145 mmol/L 109(LL) 111(LL)  Potassium 3.5 - 5.1 mmol/L 6.9(HH) 7.3(HH)  Chloride 101 - 111 mmol/L 80(L) 79(L)  CO2 22 - 32 mmol/L - 19(L)  Calcium 8.9 - 10.3 mg/dL - 9.5   CBC Latest Ref Rng 05/09/2015 05/09/2015  WBC 4.0 - 10.5 K/uL - 11.5(H)  Hemoglobin 13.0 - 17.0 g/dL 13.9 12.6(L)  Hematocrit 39.0 - 52.0 % 41.0 35.9(L)  Platelets 150 - 400 K/uL - 306     No results found.  Review of Systems  Constitutional: Positive for malaise/fatigue. Negative for fever and chills.  HENT: Negative.   Eyes: Negative.   Respiratory: Positive for cough. Negative for sputum production and shortness of breath.   Cardiovascular: Positive for chest pain, palpitations and leg swelling. Negative for orthopnea.  Gastrointestinal: Positive for heartburn and constipation. Negative for nausea, vomiting, abdominal pain and diarrhea.  Genitourinary: Negative.   Musculoskeletal: Positive for back pain and falls.  Skin:       See history of present illness  Neurological: Positive for weakness.  Recent syncopal event  Psychiatric/Behavioral: The patient is nervous/anxious.    Blood pressure 94/80, pulse 63, resp. rate 17, SpO2 100 %. Physical Exam  Nursing note and vitals reviewed. Constitutional: He is oriented to person, place, and time. He appears well-developed. No distress.  Morbidly obese and unkempt man  HENT:  Head: Normocephalic and atraumatic.  Nose: Nose normal.   Mouth/Throat: No oropharyngeal exudate.  Eyes: EOM are normal. Pupils are equal, round, and reactive to light. No scleral icterus.  Neck: Normal range of motion. Neck supple. No JVD present. No thyromegaly present.  Cardiovascular: Normal rate, regular rhythm and normal heart sounds.   Respiratory: Breath sounds normal. He has no wheezes. He has no rales.  GI: Soft. Bowel sounds are normal. There is no tenderness. There is no rebound and no guarding.  Musculoskeletal: He exhibits edema.  Nonpitting lower extremity lymphedema  Neurological: He is alert and oriented to person, place, and time.  Skin: Rash noted.  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    Assessment/Plan: 1. Acute renal failure-unknown baseline renal function: appears to be possibly hemodynamically mediated however, entirely plausible that he has baseline chronic kidney disease particularly without any prior medical records. I will check a urinalysis, urine electrolytes and request for a renal ultrasound. Send off for serologies including ANA, ANCA and complement levels. 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 I have discussed with the patient at length regarding the suspected diagnosis and strategies for management at this time that would include intravenous fluid therapy and forced diuresis to help lower his hyperkalemia. I have informed him that if he does not have satisfactory improvement, will need hemodialysis.  2. Hyperkalemia: secondary to acute renal failure, status post intravenous calcium, intravenous furosemide and ongoing intravenous fluids. Will order for Kayexalate and recheck labs again later today.  3. Lower extremity lichenification/ulcers: will likely need aggressive wound care to help improve healing-does not  appear to have flagrant cellulitis however, I would favor empiric antibiotic therapy including anaerobic coverage.  4. Hyponatremia: this appears to be due to impaired free water handling in the face of ongoing acute renal failure and unrestricted fluid intake. Will give isotonic intravenous fluids and monitor for possible improvement with renal recovery. I will check serum osmolality and urine osmolality to further corroborate workup.    Jonne Rote K. 05/09/2015, 11:35 AM

## 2015-05-10 ENCOUNTER — Inpatient Hospital Stay (HOSPITAL_COMMUNITY): Payer: Self-pay

## 2015-05-10 DIAGNOSIS — E872 Acidosis: Secondary | ICD-10-CM

## 2015-05-10 DIAGNOSIS — N178 Other acute kidney failure: Secondary | ICD-10-CM

## 2015-05-10 DIAGNOSIS — R0600 Dyspnea, unspecified: Secondary | ICD-10-CM

## 2015-05-10 DIAGNOSIS — L03115 Cellulitis of right lower limb: Secondary | ICD-10-CM

## 2015-05-10 DIAGNOSIS — N179 Acute kidney failure, unspecified: Secondary | ICD-10-CM | POA: Diagnosis present

## 2015-05-10 LAB — BASIC METABOLIC PANEL
ANION GAP: 13 (ref 5–15)
ANION GAP: 16 — AB (ref 5–15)
ANION GAP: 11 (ref 5–15)
Anion gap: 8 (ref 5–15)
BUN: 138 mg/dL — ABNORMAL HIGH (ref 6–20)
BUN: 135 mg/dL — ABNORMAL HIGH (ref 6–20)
BUN: 129 mg/dL — ABNORMAL HIGH (ref 6–20)
BUN: 124 mg/dL — ABNORMAL HIGH (ref 6–20)
CALCIUM: 8.3 mg/dL — AB (ref 8.9–10.3)
CALCIUM: 8.2 mg/dL — AB (ref 8.9–10.3)
CHLORIDE: 88 mmol/L — AB (ref 101–111)
CHLORIDE: 87 mmol/L — AB (ref 101–111)
CO2: 18 mmol/L — AB (ref 22–32)
CO2: 14 mmol/L — ABNORMAL LOW (ref 22–32)
CO2: 22 mmol/L (ref 22–32)
CO2: 22 mmol/L (ref 22–32)
CREATININE: 4.56 mg/dL — AB (ref 0.61–1.24)
Calcium: 8.7 mg/dL — ABNORMAL LOW (ref 8.9–10.3)
Calcium: 8.5 mg/dL — ABNORMAL LOW (ref 8.9–10.3)
Chloride: 88 mmol/L — ABNORMAL LOW (ref 101–111)
Chloride: 85 mmol/L — ABNORMAL LOW (ref 101–111)
Creatinine, Ser: 4.41 mg/dL — ABNORMAL HIGH (ref 0.61–1.24)
Creatinine, Ser: 3.95 mg/dL — ABNORMAL HIGH (ref 0.61–1.24)
Creatinine, Ser: 3.63 mg/dL — ABNORMAL HIGH (ref 0.61–1.24)
GFR calc Af Amer: 15 mL/min — ABNORMAL LOW (ref >60)
GFR calc Af Amer: 17 mL/min — ABNORMAL LOW (ref >60)
GFR calc non Af Amer: 13 mL/min — ABNORMAL LOW (ref >60)
GFR, EST AFRICAN AMERICAN: 15 mL/min — AB (ref >60)
GFR, EST AFRICAN AMERICAN: 19 mL/min — AB (ref >60)
GFR, EST NON AFRICAN AMERICAN: 13 mL/min — AB (ref >60)
GFR, EST NON AFRICAN AMERICAN: 15 mL/min — AB (ref >60)
GFR, EST NON AFRICAN AMERICAN: 17 mL/min — AB (ref >60)
GLUCOSE: 183 mg/dL — AB (ref 65–99)
Glucose, Bld: 169 mg/dL — ABNORMAL HIGH (ref 65–99)
Glucose, Bld: 157 mg/dL — ABNORMAL HIGH (ref 65–99)
Glucose, Bld: 150 mg/dL — ABNORMAL HIGH (ref 65–99)
POTASSIUM: 5 mmol/L (ref 3.5–5.1)
POTASSIUM: 5.2 mmol/L — AB (ref 3.5–5.1)
Potassium: 4.9 mmol/L (ref 3.5–5.1)
Potassium: 4.9 mmol/L (ref 3.5–5.1)
SODIUM: 117 mmol/L — AB (ref 135–145)
SODIUM: 118 mmol/L — AB (ref 135–145)
SODIUM: 118 mmol/L — AB (ref 135–145)
Sodium: 119 mmol/L — CL (ref 135–145)

## 2015-05-10 LAB — PHOSPHORUS: PHOSPHORUS: 5.3 mg/dL — AB (ref 2.5–4.6)

## 2015-05-10 LAB — TROPONIN I: TROPONIN I: 0.07 ng/mL — AB (ref <0.031)

## 2015-05-10 LAB — CBC
HEMATOCRIT: 36.5 % — AB (ref 39.0–52.0)
HEMOGLOBIN: 13 g/dL (ref 13.0–17.0)
MCH: 31.6 pg (ref 26.0–34.0)
MCHC: 35.6 g/dL (ref 30.0–36.0)
MCV: 88.6 fL (ref 78.0–100.0)
Platelets: 303 10*3/uL (ref 150–400)
RBC: 4.12 MIL/uL — ABNORMAL LOW (ref 4.22–5.81)
RDW: 13.3 % (ref 11.5–15.5)
WBC: 30.7 10*3/uL — AB (ref 4.0–10.5)

## 2015-05-10 LAB — PROTEIN ELECTROPHORESIS, SERUM
A/G RATIO SPE: 1 (ref 0.7–1.7)
ALBUMIN ELP: 3.5 g/dL (ref 2.9–4.4)
Alpha-1-Globulin: 0.2 g/dL (ref 0.0–0.4)
Alpha-2-Globulin: 0.8 g/dL (ref 0.4–1.0)
Beta Globulin: 1.3 g/dL (ref 0.7–1.3)
Gamma Globulin: 1.1 g/dL (ref 0.4–1.8)
Globulin, Total: 3.4 g/dL (ref 2.2–3.9)
TOTAL PROTEIN ELP: 6.9 g/dL (ref 6.0–8.5)

## 2015-05-10 LAB — URINE CULTURE

## 2015-05-10 LAB — HEMOGLOBIN A1C
Hgb A1c MFr Bld: 5.2 % (ref 4.8–5.6)
MEAN PLASMA GLUCOSE: 103 mg/dL

## 2015-05-10 LAB — MAGNESIUM: Magnesium: 5.1 mg/dL — ABNORMAL HIGH (ref 1.7–2.4)

## 2015-05-10 LAB — PROCALCITONIN: PROCALCITONIN: 6.15 ng/mL

## 2015-05-10 MED ORDER — PHENYLEPHRINE HCL 10 MG/ML IJ SOLN
0.0000 ug/min | INTRAMUSCULAR | Status: DC
  Administered 2015-05-10: 266.667 ug/min via INTRAVENOUS
  Administered 2015-05-10 – 2015-05-11 (×2): 200 ug/min via INTRAVENOUS
  Administered 2015-05-11: 266.667 ug/min via INTRAVENOUS
  Administered 2015-05-11: 140 ug/min via INTRAVENOUS
  Administered 2015-05-11: 100 ug/min via INTRAVENOUS
  Administered 2015-05-11: 120 ug/min via INTRAVENOUS
  Administered 2015-05-11: 200 ug/min via INTRAVENOUS
  Filled 2015-05-10 (×9): qty 4

## 2015-05-10 MED ORDER — FENTANYL CITRATE (PF) 100 MCG/2ML IJ SOLN
12.5000 ug | INTRAMUSCULAR | Status: DC | PRN
  Administered 2015-05-10: 25 ug via INTRAVENOUS
  Filled 2015-05-10: qty 2

## 2015-05-10 MED ORDER — HYDROCERIN EX CREA
TOPICAL_CREAM | Freq: Every day | CUTANEOUS | Status: DC
  Administered 2015-05-10 – 2015-05-12 (×3): via TOPICAL
  Administered 2015-05-13 – 2015-05-14 (×2): 1 via TOPICAL
  Administered 2015-05-15 – 2015-05-16 (×2): via TOPICAL
  Administered 2015-05-17 – 2015-05-18 (×2): 1 via TOPICAL
  Administered 2015-05-19 – 2015-05-23 (×5): via TOPICAL
  Filled 2015-05-10 (×10): qty 113

## 2015-05-10 MED ORDER — PHENYLEPHRINE HCL 10 MG/ML IJ SOLN
0.0000 ug/min | INTRAVENOUS | Status: DC
  Administered 2015-05-10: 10 ug/min via INTRAVENOUS
  Administered 2015-05-10: 90 ug/min via INTRAVENOUS
  Filled 2015-05-10 (×3): qty 1

## 2015-05-10 MED ORDER — SODIUM BICARBONATE 8.4 % IV SOLN
INTRAVENOUS | Status: DC
  Administered 2015-05-10 – 2015-05-11 (×3): via INTRAVENOUS
  Filled 2015-05-10 (×4): qty 150

## 2015-05-10 MED ORDER — SODIUM CHLORIDE 0.9 % IV BOLUS (SEPSIS)
500.0000 mL | Freq: Once | INTRAVENOUS | Status: AC
  Administered 2015-05-10: 500 mL via INTRAVENOUS

## 2015-05-10 MED ORDER — PANTOPRAZOLE SODIUM 40 MG PO TBEC
40.0000 mg | DELAYED_RELEASE_TABLET | Freq: Every day | ORAL | Status: DC
  Administered 2015-05-10 – 2015-05-11 (×2): 40 mg via ORAL
  Filled 2015-05-10 (×2): qty 1

## 2015-05-10 MED FILL — Perflutren Lipid Microsphere IV Susp 1.1 MG/ML: INTRAVENOUS | Qty: 10 | Status: AC

## 2015-05-10 NOTE — Progress Notes (Signed)
BMP drawn by phlebotomy at 2345.  Results of lab draw in chart labelled 1145.  Contacted main lab to adjust time, per lab will make correction to time so that it appears at time it was drawn.  At this time that adjustment is not reflected in the chart and lab draw appears to have taken place at 1145 instead of 2345.

## 2015-05-10 NOTE — Progress Notes (Signed)
CRITICAL VALUE ALERT  Critical value received:  Na: 117  Date of notification:  05/10/15  Time of notification:  0324  Critical value read back:Yes.    Nurse who received alert:  Archie Endo, RN  MD notified (1st page):  Dr. Nelda Marseille- CCM  Time of first page:  0325  Time MD responded:  [phone]  Orders received.  Will continue to monitor closely.

## 2015-05-10 NOTE — Progress Notes (Signed)
PULMONARY / CRITICAL CARE MEDICINE   Name: Scott Shelton MRN: [id number] DOB: 1950-10-05    ADMISSION DATE:  05/09/2015 CONSULTATION DATE:  05/09/2015  REFERRING MD:  Dr. Claudine Mouton EDP  CHIEF COMPLAINT:  syncope  HISTORY OF PRESENT ILLNESS:   65 year old male with no significant past medical history. He has neglected his own medical care as he is the primary caregiver for his father who has advanced dementia. His health for the most part has been "pretty good" with the exception of a cataract surgery. About one year ago he developed small red punctate lesions to bilateral lower extremities from the calves down. These lesions began to spread and overwhelmed his the entirety of this area. He managed this himself without medical attention, but it seems like he has not been doing much for them. Over the past few weeks he has complained on increased DOE with simple tasks that would have been easy for him before. Denies cough, chest pain, and SOB at rest. 1/31 he was standing next to the couch suffered a syncopal episode during which he fell back onto the couch into a seated position. He awoke spontaneously about 3-4 minutes later. He presented to ED with this complaint. In the emergency department he was found to be hypotensive 94/80 and hypothermic 96.29F. Laboratory evaluation significant for Creatinine 5.82, K 7.3, Na 109, Cl 79. Also had some mild leukocytosis with WBC 11.5. Seen by nephrology in ED, PCCM to admit.  SUBJECTIVE:  Feeling better Note Na initially increased a bit quickly, IVF adjusted yesterday pm Metabolic acidosis persists  VITAL SIGNS: BP 89/54 mmHg  Pulse 77  Temp(Src) 97.4 F (36.3 C) (Oral)  Resp 20  Ht 6' (1.829 m)  Wt 145.5 kg (320 lb 12.3 oz)  BMI 43.49 kg/m2  SpO2 99%  HEMODYNAMICS:    VENTILATOR SETTINGS:    INTAKE / OUTPUT: I/O last 3 completed shifts: In: 5341.7 [P.O.:1500; I.V.:3791.7; IV Piggyback:50] Out: [id number] [Urine:3050; Stool:1]  PHYSICAL  EXAMINATION: General:  Obese male in NAD Neuro:  Alert, oriented, non-focal HEENT:  [place]/AT, PERRL, no JVD Cardiovascular:  RRR, no MRG Lungs:  Clear bilateral breath sounds, mildly increased WOB Abdomen:  Soft, non-tender, non-distended Musculoskeletal:  No acute deformity Skin:  LLE edematous and dry. RLE edematous, erythema, areas of ulceration.  LABS:  BMET  Recent Labs Lab 05/09/15 2052 05/09/15 2208 05/10/15 0222  NA 120* 120* 117*  K 4.4 4.7 5.0  CL 89* 87* 87*  CO2 20* 21* 14*  BUN 137* 136* 135*  CREATININE 4.58* 4.46* 4.41*  GLUCOSE 178* 188* 169*    Electrolytes  Recent Labs Lab 05/09/15 2052 05/09/15 2208 05/10/15 0222  CALCIUM 8.8* 8.9 8.5*  MG  --   --  5.1*  PHOS  --   --  5.3*    CBC  Recent Labs Lab 05/09/15 1000 05/09/15 1038 05/10/15 0222  WBC 11.5*  --  30.7*  HGB 12.6* 13.9 13.0  HCT 35.9* 41.0 36.5*  PLT 306  --  303    Coag's No results for input(s): APTT, INR in the last 168 hours.  Sepsis Markers  Recent Labs Lab 05/09/15 1450 05/09/15 1502 05/09/15 1703 05/10/15 0222  LATICACIDVEN 2.5* 2.49* 1.6  --   PROCALCITON 0.19  --   --  6.15    ABG No results for input(s): PHART, PCO2ART, PO2ART in the last 168 hours.  Liver Enzymes No results for input(s): AST, ALT, ALKPHOS, BILITOT, ALBUMIN in the last 168 hours.  Cardiac Enzymes  Recent Labs Lab 05/09/15 1145 05/09/15 1450 05/09/15 2052  TROPONINI 0.07* 0.08* 0.07*    Glucose  Recent Labs Lab 05/09/15 1001 05/09/15 1239  GLUCAP 122* 116*    Imaging Dg Chest 2 View  05/09/2015  CLINICAL DATA:  Speech syncope, lightheadedness EXAM: CHEST  2 VIEW COMPARISON:  None. FINDINGS: There is mild elevation of the right diaphragm. There is no focal parenchymal opacity. There is no pleural effusion or pneumothorax. The heart and mediastinal contours are unremarkable. The osseous structures are unremarkable. IMPRESSION: No active cardiopulmonary disease. Electronically  Signed   By: Kathreen Devoid   On: 05/09/2015 12:30   US Renal  05/09/2015  CLINICAL DATA:  Acute renal failure. EXAM: RENAL / URINARY TRACT ULTRASOUND COMPLETE COMPARISON:  None. FINDINGS: Right Kidney: Length: 11 cm. Echogenicity within normal limits. No mass or hydronephrosis visualized. Left Kidney: Length: 12.8 cm. Echogenicity within normal limits. No mass or hydronephrosis visualized. Bladder: Decompressed secondary to Foley catheter. IMPRESSION: No renal abnormality seen. Electronically Signed   By: Marijo Conception, M.D.   On: 05/09/2015 16:00     STUDIES:  Renal US 1/31 >>> normal  CULTURES: Blood 1/31 >>> Urine 1/31 >>>  ANTIBIOTICS: Zosyn/Vanc in ED 1/31 Rocephin 1/31 >>>  SIGNIFICANT EVENTS: 1/31 admit  LINES/TUBES:   DISCUSSION: 65 year old male with no PMH. 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 1 year history of BLE edema and wounds. Suffered syncopal episode 1/31 and found to be hypotensive with acute renal failure, hypernatremia, hyperkalemia. Admited to [facility] for medical management, nephrology seeing and helping with metabolic derangements.   ASSESSMENT / PLAN:  PULMONARY A: No acute issues  P:   Supplemental O2 as indicated to keep SpO2 > 92% Incentive spirometry  CARDIOVASCULAR A:  Hypotension - hypovolemia vs severe sepsis  P:  Telemetry Volume resuscitation per nephrology > changing to bicarb gtt am 2/1 + gentle Na replacement MAP goal > 30mmHg  RENAL A:   Acute renal failure Hyperkalemia (given insulin, calcium, lasix, and kayexalate in ED) Hyponatremia chronic, hypovolemic NAG acidosis  P:   Nephrology following and adjusting IVF + bicarb am 2/1 Serial BMP to q8 hours.  Correct potassium as indicated  GASTROINTESTINAL A:   No acute issues  P:   Protonix for SUP  Renal diet  HEMATOLOGIC A:   Anemia in setting renal failure  P:  Follow CBC SQ heparin for VTE  ppx  INFECTIOUS A:   Severe sepsis secondary to RLE cellulitis  P:   Cultures and ABX as above (rocephin) PCT and WBC suggests infxn  ENDOCRINE A:   No acute issues  P:   Follow glucose on BMP  NEUROLOGIC A:   No acute issues  P:   RASS goal: 0 Monitor  GLOBAL: Patient has father of whom he is primary caregiver. Father is here in hospital and there is no one to keep and eye on him. Will consult social work.   FAMILY  - Updates: patient updated in ED by [facility], RB  - Inter-disciplinary family meet or Palliative Care meeting due by:  2/6  Baltazar Apo, MD, PhD 05/10/2015, 8:39 AM [facility] [phone] or if no answer [phone]

## 2015-05-10 NOTE — Progress Notes (Signed)
eLink Physician-Brief Progress Note Patient Name: Scott Shelton DOB: 10/28/1950 MRN: [id number]   Date of Service  05/10/2015  HPI/Events of Note    eICU Interventions  Low dose fent prn pain     Intervention Category Minor Interventions: Routine modifications to care plan (e.g. PRN medications for pain, fever)  ALVA,RAKESH V. 05/10/2015, 10:54 PM

## 2015-05-10 NOTE — Progress Notes (Signed)
Utilization review completed. Denim Start, RN, BSN. 

## 2015-05-10 NOTE — Progress Notes (Signed)
CSW spoke with patient's nurse- Anderson Malta and obtained update on patient and situation with patient's father Dub Mikes who remains in the hospital as no family will come and pick him up.  CSW contacted Ms. [place] who indicated that they were aware of the situation and that it has been assigned to a Education officer, museum for follow up in the next 72 hours. CSW explained urgency of situation as this 65 year old male is currently staying son's ICU room unattended as patient cannot care for his father. Nursing indicated that they arranged for a meal tray for patient but otherwise are not able to provide care for this gentleman.  Due to urgency expressed- Ms. Beverely Low indicated that she would check to see if a follow up could be made quicker and would call this CSW back.  Lorie Phenix. Pauline Good, [place]

## 2015-05-10 NOTE — Progress Notes (Signed)
Contacted CCM for SBP 60's and MAP 50's, order received for bolus x 1.

## 2015-05-10 NOTE — Progress Notes (Signed)
CRITICAL VALUE ALERT  Critical value received:  Na 118  Date of notification:  05/10/15   Time of notification:  [id number]  Critical value read back:Yes.    Nurse who received alert:  Celestial Barnfield rn - took critical value for [employer] and reporting to her  MD notified (1st page):  md aware

## 2015-05-10 NOTE — Progress Notes (Signed)
  Echocardiogram 2D Echocardiogram has been performed.  Donata Clay 05/10/2015, 11:11 AM

## 2015-05-10 NOTE — Progress Notes (Signed)
I have had separate phone conversations with the patients brother's Ryananthony Durbano [phone] & [name] Koelling [phone]. Toney Reil and [name] have both refused to be a part of a solution to assist with any care of their father at this time or in the future.  They have gone to the extent of refusing to come to the hospital and sit with their father or look for solutions to assist him with food, clothing, or basic care.    Richardean Canal RN, BSN, CCRN

## 2015-05-10 NOTE — Progress Notes (Signed)
Call received from Ms. [place] DSS. Case has been assigned Scott Shelton  who will come to the hospital today to assess the situation. Ms. Ouida Sills was provided this CSW's number for follow-up. Notified Unit RN Anderson Malta of above. Lorie Phenix. Pauline Good, [place]

## 2015-05-10 NOTE — Progress Notes (Signed)
Patient ID: Scott Shelton, male   DOB: 11-16-1950, 65 y.o.   MRN: [id number]  [facility] Progress Note   Assessment/ Plan:   1. Acute renal failure- suspected to be hemodynamically mediated acute renal failure with prerenal azotemia/evolution to ischemic ATN. Urine sediment points away from acute GN and renal ultrasound does not show any obstruction. Continue intravenous fluid support as we manage expectantly for renal recovery. No acute indications for dialysis at this time. Switch to isotonic sodium bicarbonate this morning for metabolic acidosis.  2. Hyperkalemia: improved overnight status post IV fluids/Kayexalate/Lasix. Anticipated to improve further with renal recovery/sodium bicarbonate drip.   3. Lower extremity lichenification/ulcers: awaiting evaluation by wound care  4. Hyponatremia: Switched over from saline to D5W overnight after rapid rise of sodium level-will switch him to isotonic sodium bicarbonate this morning at a slow rate of 100 mL per hour to help improve sodium level/metabolic acidosis.   Subjective:   Reports to be feeling fair-had some abdominal pain overnight that improved with bowel movement.    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 FINDINGS: There is mild elevation of the right diaphragm. There is no focal parenchymal opacity. There is no pleural effusion or pneumothorax. The heart and mediastinal contours are unremarkable. The osseous structures are unremarkable. IMPRESSION: No active cardiopulmonary disease. Electronically Signed   By: Kathreen Devoid   On: 05/09/2015 12:30   US Renal  05/09/2015  CLINICAL DATA:  Acute renal failure. EXAM: RENAL / URINARY TRACT ULTRASOUND COMPLETE COMPARISON:  None. FINDINGS: Right Kidney: Length: 11 cm. Echogenicity within normal limits. No mass or hydronephrosis visualized. Left Kidney: Length: 12.8 cm. Echogenicity within normal limits. No mass or hydronephrosis visualized. Bladder: Decompressed secondary to Foley catheter. IMPRESSION: No renal abnormality seen. Electronically Signed   By: Marijo Conception, M.D.   On: 05/09/2015 16:00    Labs: BMET  Recent Labs Lab 05/09/15 1000 05/09/15 1038 05/09/15 1145 05/09/15 1450 05/09/15 1703 05/09/15 2052 05/09/15 2208 05/10/15 0222  NA 111* 109* 119* 122* 122* 120* 120* 117*  K 7.3* 6.9* 4.9 5.1 4.4 4.4 4.7 5.0  CL 79* 80* 88* 91* 90* 89* 87* 87*  CO2 19*  --  18* 18* 20* 20* 21* 14*  GLUCOSE 123* 113* 183* 95 124* 178* 188* 169*  BUN 158* >140* 138* 147* 144* 137* 136* 135*  CREATININE 5.82* 6.20* 4.56* 5.03* 4.76* 4.58* 4.46* 4.41*  CALCIUM 9.5  --  8.7* 8.3* 8.5* 8.8* 8.9 8.5*  PHOS  --   --   --   --   --   --   --  5.3*   CBC  Recent Labs Lab 05/09/15  1000 05/09/15 1038 05/10/15 0222  WBC 11.5*  --  30.7*  HGB 12.6* 13.9 13.0  HCT 35.9* 41.0 36.5*  MCV 87.6  --  88.6  PLT 306  --  303    Medications:    . cefTRIAXone (ROCEPHIN)  IV  2 g Intravenous Q24H  . Chlorhexidine Gluconate Cloth  6 each Topical Q0600  . heparin  5,000 Units Subcutaneous 3 times per day  . mupirocin ointment  1 application Nasal BID  . pantoprazole (PROTONIX) IV  40 mg Intravenous Q24H   Elmarie Shiley, MD 05/10/2015, 8:25 AM

## 2015-05-10 NOTE — Consult Note (Addendum)
WOC wound consult note Reason for Consult: Consult requested for BLE.  Pt has chronic edema and skin changes consistent with lymphedema.  Right leg also has cellulitis.. Wound type: Bilat legs and feet with large amt edema and dry crusted skin extending from feet to below knees.  Crusted scabbed areas remove easily in some locations, revealing pink moist intact skin, and is tightly adhered and dry yellow in other areas.   There is currently no open full thickness wounds or drainage. Right foot and leg with generalized erythremia also.  Right anterior foot with partial thickness wound, approx 2X2X.1xm, pink and moist, scant amt yellow drainage, no odor. Dressing procedure/placement/frequency: Pt states he has never been treated for lymphadenia in the past and was not aware of this diagnosis.  Discussed topical treatment and he verbalized understanding. He is on systemic antibiotic coverage for cellulitis. Eucerin cream to assist with removal of nonviable tissue and ace wraps for light compression.  Pt states he will be able to apply Eucerin and ace wraps after discharge home and he denies furhter questions. Please re-consult if further assistance is needed.  Thank-you,  Julien Girt MSN, [place], [place], [place], [place]

## 2015-05-10 NOTE — Procedures (Signed)
Central Venous Catheter Insertion Procedure Note Scott Shelton [id number] 27-Nov-1950  Procedure: Insertion of Central Venous Catheter Indications: Assessment of intravascular volume, Drug and/or fluid administration and Frequent blood sampling  Procedure Details Consent: Risks of procedure as well as the alternatives and risks of each were explained to the (patient/caregiver).  Consent for procedure obtained. Time Out: Verified patient identification, verified procedure, site/side was marked, verified correct patient position, special equipment/implants available, medications/allergies/relevent history reviewed, required imaging and test results available.  Performed  Maximum sterile technique was used including antiseptics, cap, gloves, gown, hand hygiene, mask and sheet. Skin prep: Chlorhexidine; local anesthetic administered A antimicrobial bonded/coated triple lumen catheter was placed in the left internal jugular vein using the Seldinger technique.  Evaluation Blood flow good Complications: No apparent complications Patient did tolerate procedure well. Chest X-ray ordered to verify placement.  CXR: pending.  Procedure performed under direct supervision of Dr.Nestor. Ultrasound utilized for realtime vessel cannulation  Scott Shelton S. [name] ANP-BC Pulmonary and Critical Care Medicine [facility] Pager: [phone]  05/10/2015, 1:43 PM

## 2015-05-10 NOTE — Progress Notes (Signed)
Discussion with [name], brother to pt about taking his father home with him.  [name] advises these are decisions that were made 20 years ago when his father was "in his right mind".  Advised him that APS would have to be contacted for his father and [name] was adamant that no one else in the family could/would provide care for his father.  Father in room with pt asleep at this time.

## 2015-05-11 ENCOUNTER — Inpatient Hospital Stay (HOSPITAL_COMMUNITY): Payer: MEDICAID

## 2015-05-11 ENCOUNTER — Inpatient Hospital Stay (HOSPITAL_COMMUNITY): Payer: Self-pay

## 2015-05-11 DIAGNOSIS — I951 Orthostatic hypotension: Secondary | ICD-10-CM

## 2015-05-11 DIAGNOSIS — R6521 Severe sepsis with septic shock: Secondary | ICD-10-CM

## 2015-05-11 DIAGNOSIS — A419 Sepsis, unspecified organism: Principal | ICD-10-CM

## 2015-05-11 DIAGNOSIS — N170 Acute kidney failure with tubular necrosis: Secondary | ICD-10-CM

## 2015-05-11 LAB — BASIC METABOLIC PANEL
ANION GAP: 12 (ref 5–15)
BUN: 108 mg/dL — AB (ref 6–20)
CALCIUM: 8.5 mg/dL — AB (ref 8.9–10.3)
CHLORIDE: 80 mmol/L — AB (ref 101–111)
CO2: 27 mmol/L (ref 22–32)
CREATININE: 3.07 mg/dL — AB (ref 0.61–1.24)
GFR calc non Af Amer: 20 mL/min — ABNORMAL LOW (ref >60)
GFR, EST AFRICAN AMERICAN: 23 mL/min — AB (ref >60)
Glucose, Bld: 126 mg/dL — ABNORMAL HIGH (ref 65–99)
Potassium: 4.3 mmol/L (ref 3.5–5.1)
SODIUM: 119 mmol/L — AB (ref 135–145)

## 2015-05-11 LAB — BASIC METABOLIC PANEL WITH GFR
Anion gap: 12 (ref 5–15)
Anion gap: 14 (ref 5–15)
BUN: 100 mg/dL — ABNORMAL HIGH (ref 6–20)
BUN: 91 mg/dL — ABNORMAL HIGH (ref 6–20)
CO2: 29 mmol/L (ref 22–32)
CO2: 28 mmol/L (ref 22–32)
Calcium: 8.6 mg/dL — ABNORMAL LOW (ref 8.9–10.3)
Calcium: 8.7 mg/dL — ABNORMAL LOW (ref 8.9–10.3)
Chloride: 80 mmol/L — ABNORMAL LOW (ref 101–111)
Chloride: 81 mmol/L — ABNORMAL LOW (ref 101–111)
Creatinine, Ser: 2.91 mg/dL — ABNORMAL HIGH (ref 0.61–1.24)
Creatinine, Ser: 2.87 mg/dL — ABNORMAL HIGH (ref 0.61–1.24)
GFR calc Af Amer: 25 mL/min — ABNORMAL LOW
GFR calc Af Amer: 25 mL/min — ABNORMAL LOW
GFR calc non Af Amer: 22 mL/min — ABNORMAL LOW
GFR calc non Af Amer: 22 mL/min — ABNORMAL LOW
Glucose, Bld: 164 mg/dL — ABNORMAL HIGH (ref 65–99)
Glucose, Bld: 122 mg/dL — ABNORMAL HIGH (ref 65–99)
Potassium: 4.7 mmol/L (ref 3.5–5.1)
Potassium: 4.4 mmol/L (ref 3.5–5.1)
Sodium: 121 mmol/L — ABNORMAL LOW (ref 135–145)
Sodium: 123 mmol/L — ABNORMAL LOW (ref 135–145)

## 2015-05-11 LAB — GLUCOSE, CAPILLARY
GLUCOSE-CAPILLARY: 145 mg/dL — AB (ref 65–99)
GLUCOSE-CAPILLARY: 123 mg/dL — AB (ref 65–99)
Glucose-Capillary: 104 mg/dL — ABNORMAL HIGH (ref 65–99)

## 2015-05-11 LAB — PROCALCITONIN: PROCALCITONIN: 13.77 ng/mL

## 2015-05-11 MED ORDER — METOCLOPRAMIDE HCL 5 MG/ML IJ SOLN
5.0000 mg | Freq: Four times a day (QID) | INTRAMUSCULAR | Status: DC
  Administered 2015-05-11: 5 mg via INTRAVENOUS
  Filled 2015-05-11: qty 2

## 2015-05-11 MED ORDER — DOCUSATE SODIUM 100 MG PO CAPS
100.0000 mg | ORAL_CAPSULE | Freq: Every day | ORAL | Status: DC
  Administered 2015-05-11 – 2015-05-13 (×2): 100 mg via ORAL
  Filled 2015-05-11 (×10): qty 1

## 2015-05-11 MED ORDER — SODIUM CHLORIDE 0.9 % IV SOLN
INTRAVENOUS | Status: DC
Start: 2015-05-11 — End: 2015-05-20
  Administered 2015-05-11 – 2015-05-16 (×12): via INTRAVENOUS
  Administered 2015-05-17: 500 mL via INTRAVENOUS
  Administered 2015-05-17 – 2015-05-18 (×3): via INTRAVENOUS

## 2015-05-11 MED ORDER — DIATRIZOATE MEGLUMINE & SODIUM 66-10 % PO SOLN
ORAL | Status: AC
  Administered 2015-05-11: 90 mL via NASOGASTRIC
  Filled 2015-05-11: qty 90

## 2015-05-11 MED ORDER — NOREPINEPHRINE BITARTRATE 1 MG/ML IV SOLN
2.0000 ug/min | INTRAVENOUS | Status: DC
  Administered 2015-05-11: 10 ug/min via INTRAVENOUS
  Administered 2015-05-12: 20 ug/min via INTRAVENOUS
  Administered 2015-05-12: 14 ug/min via INTRAVENOUS
  Administered 2015-05-12: 12 ug/min via INTRAVENOUS
  Administered 2015-05-13: 10 ug/min via INTRAVENOUS
  Administered 2015-05-13: 11 ug/min via INTRAVENOUS
  Filled 2015-05-11 (×8): qty 4

## 2015-05-11 MED ORDER — ALUM & MAG HYDROXIDE-SIMETH 200-200-20 MG/5ML PO SUSP
15.0000 mL | Freq: Four times a day (QID) | ORAL | Status: DC | PRN
  Administered 2015-05-11 – 2015-05-21 (×3): 15 mL via ORAL
  Filled 2015-05-11 (×3): qty 30

## 2015-05-11 MED ORDER — CALCIUM CARBONATE ANTACID 500 MG PO CHEW
1.0000 | CHEWABLE_TABLET | Freq: Two times a day (BID) | ORAL | Status: DC
  Filled 2015-05-11: qty 1

## 2015-05-11 MED ORDER — BISACODYL 10 MG RE SUPP
10.0000 mg | Freq: Every day | RECTAL | Status: DC | PRN
  Administered 2015-05-11: 10 mg via RECTAL
  Filled 2015-05-11: qty 1

## 2015-05-11 MED ORDER — PHENOL 1.4 % MT LIQD
1.0000 | OROMUCOSAL | Status: DC | PRN
Start: 2015-05-11 — End: 2015-05-23
  Filled 2015-05-11: qty 177

## 2015-05-11 MED ORDER — SODIUM CHLORIDE 0.9% FLUSH: 10.0000 mL | INTRAVENOUS | Status: DC | PRN

## 2015-05-11 MED ORDER — DIATRIZOATE MEGLUMINE & SODIUM 66-10 % PO SOLN
90.0000 mL | Freq: Once | ORAL | Status: AC
  Administered 2015-05-11: 90 mL via NASOGASTRIC

## 2015-05-11 MED ORDER — SODIUM CHLORIDE 0.9% FLUSH
10.0000 mL | Freq: Two times a day (BID) | INTRAVENOUS | Status: DC
  Administered 2015-05-11: 10 mL
  Administered 2015-05-12: 20 mL
  Administered 2015-05-13 – 2015-05-14 (×2): 10 mL
  Administered 2015-05-14: 20 mL
  Administered 2015-05-15: 10 mL

## 2015-05-11 NOTE — Progress Notes (Signed)
CRITICAL VALUE ALERT  Critical value received:  Na 119  Date of notification:  2/2  Time of notification:  0500  Critical value read back:Yes.    Nurse who received alert:  Jaynie Bream  E-link MD notified: yes

## 2015-05-11 NOTE — Progress Notes (Signed)
[place] Progress Note Patient Name: Scott Shelton DOB: 1950-11-13 MRN: [id number]   Date of Service  05/11/2015  HPI/Events of Note  Nausea, vomiting despite zofran  eICU Interventions  kub reglan x3 doses, low dose     Intervention Category Intermediate Interventions: Abdominal pain - evaluation and management  Simonne Maffucci 05/11/2015, 3:32 PM

## 2015-05-11 NOTE — Progress Notes (Signed)
[place] Progress Note Patient Name: Scott Shelton DOB: 1950/07/12 MRN: [id number]   Date of Service  05/11/2015  HPI/Events of Note  Shock despite 229mcg/min neo  eICU Interventions  Start levophed     Intervention Category Major Interventions: Shock - evaluation and management  Simonne Maffucci 05/11/2015, 9:32 PM

## 2015-05-11 NOTE — Progress Notes (Signed)
Pt vomited 200 ml bile at this time; total since 1200 600 ml vomited; pt c/o abdominal discomfort and continued c/o nausea; e-link called to make aware; orders for xray will be placed; will cont. To monitor.  Scott Shelton

## 2015-05-11 NOTE — Progress Notes (Signed)
CSW engaged with Patient's son who is admitted upstairs in [facility] [facility]) regarding his father's placement in the ED. CSW informed him that his father has gotten several acceptances and CSW is currently waiting for admissions coordinator to call back from [facility] regarding a bed in their memory care unit. CSW discussed the private pay cost for both a semi-private and private room (just room and board). Mr. [name] reports that he would prefer a semi-private room based on the cost. CSW informed him that most places will require 30 day payment up front. Mr. [name] reports understanding. Mr. [name] reports that his brother, Scott Shelton [phone]) is supposed to be going to his apartment to bring his check books but notes that he has not heard anything from him on today. CSW obtained permission from Mr. [name] to have police department obtain check book if his brother is unable to go and get it as a last resort. CSW attempted [name] x2 and left a voicemail for return call back as soon as possible. CSW will continue to follow for disposition.    [name] [facility] ED/ [place] Social Worker [phone]

## 2015-05-11 NOTE — Progress Notes (Signed)
eLink Physician-Brief Progress Note Patient Name: Scott Shelton DOB: July 13, 1950 MRN: [id number]   Date of Service  05/11/2015  HPI/Events of Note  Small bowel obstruction seen on ileus Tried to camera in the room to discuss but he was asleep  eICU Interventions  Discussed with RN, place NG Surgery consult     Intervention Category Intermediate Interventions: Abdominal pain - evaluation and management;Diagnostic test evaluation  Simonne Maffucci 05/11/2015, 4:38 PM

## 2015-05-11 NOTE — Progress Notes (Signed)
PULMONARY / CRITICAL CARE MEDICINE   Name: Scott Shelton MRN: [id number] DOB: 07-Jan-1951    ADMISSION DATE:  05/09/2015 CONSULTATION DATE:  05/09/2015  REFERRING MD:  Dr. Claudine Mouton EDP  CHIEF COMPLAINT:  syncope  HISTORY OF PRESENT ILLNESS:   65 year old male with no significant past medical history. He has neglected his own medical care as he is the primary caregiver for his father who has advanced dementia. His health for the most part has been "pretty good" with the exception of a cataract surgery. About one year ago he developed small red punctate lesions to bilateral lower extremities from the calves down. These lesions began to spread and overwhelmed his the entirety of this area. He managed this himself without medical attention, but it seems like he has not been doing much for them. Over the past few weeks he has complained on increased DOE with simple tasks that would have been easy for him before. Denies cough, chest pain, and SOB at rest. 1/31 he was standing next to the couch suffered a syncopal episode during which he fell back onto the couch into a seated position. He awoke spontaneously about 3-4 minutes later. He presented to ED with this complaint. In the emergency department he was found to be hypotensive 94/80 and hypothermic 96.97F. Laboratory evaluation significant for Creatinine 5.82, K 7.3, Na 109, Cl 79. Also had some mild leukocytosis with WBC 11.5. Seen by nephrology in ED, PCCM to admit.  SUBJECTIVE:  Pressors started 2/1, currently on 200 phenylephrine Metabolic acidosis has improved significantly  VITAL SIGNS: BP 102/51 mmHg  Pulse 73  Temp(Src) 97.9 F (36.6 C) (Oral)  Resp 19  Ht 6' (1.829 m)  Wt 148.1 kg (326 lb 8 oz)  BMI 44.27 kg/m2  SpO2 99%  HEMODYNAMICS:    VENTILATOR SETTINGS:    INTAKE / OUTPUT: I/O last 3 completed shifts: In: 7089.4 [P.O.:2180; I.V.:4359.4; IV Piggyback:550] Out: [id number] [Urine:4260; Stool:1]  PHYSICAL EXAMINATION: General:  Obese  male in NAD Neuro:  Alert, oriented, non-focal HEENT:  [place]/AT, PERRL, no JVD Cardiovascular:  RRR, no MRG Lungs:  Clear bilateral breath sounds, mildly increased WOB Abdomen:  Soft, non-tender, non-distended Musculoskeletal:  No acute deformity Skin:  LLE edematous and dry. RLE edematous, erythema, areas of ulceration.  LABS:  BMET  Recent Labs Lab 05/10/15 1345 05/10/15 1810 05/11/15 0445  NA 118* 118* 119*  K 5.2* 4.9 4.3  CL 88* 85* 80*  CO2 22 22 27   BUN 129* 124* 108*  CREATININE 3.95* 3.63* 3.07*  GLUCOSE 157* 150* 126*    Electrolytes  Recent Labs Lab 05/10/15 0222 05/10/15 1345 05/10/15 1810 05/11/15 0445  CALCIUM 8.5* 8.3* 8.2* 8.5*  MG 5.1*  --   --   --   PHOS 5.3*  --   --   --     CBC  Recent Labs Lab 05/09/15 1000 05/09/15 1038 05/10/15 0222  WBC 11.5*  --  30.7*  HGB 12.6* 13.9 13.0  HCT 35.9* 41.0 36.5*  PLT 306  --  303    Coag's No results for input(s): APTT, INR in the last 168 hours.  Sepsis Markers  Recent Labs Lab 05/09/15 1450 05/09/15 1502 05/09/15 1703 05/10/15 0222 05/11/15 0445  LATICACIDVEN 2.5* 2.49* 1.6  --   --   PROCALCITON 0.19  --   --  6.15 13.77    ABG No results for input(s): PHART, PCO2ART, PO2ART in the last 168 hours.  Liver Enzymes No results for input(s):  AST, ALT, ALKPHOS, BILITOT, ALBUMIN in the last 168 hours.  Cardiac Enzymes  Recent Labs Lab 05/09/15 1145 05/09/15 1450 05/09/15 2052  TROPONINI 0.07* 0.08* 0.07*    Glucose  Recent Labs Lab 05/09/15 1001 05/09/15 1239  GLUCAP 122* 116*    Imaging Dg Chest Port 1 View  05/10/2015  CLINICAL DATA:  Central line placement, acute respiratory failure EXAM: PORTABLE CHEST 1 VIEW COMPARISON:  05/09/2015 FINDINGS: Cardiomediastinal silhouette is stable. No segmental infiltrate or pulmonary edema. Mild infrahilar atelectasis or bronchitic changes. There is left IJ central line with tip in distal SVC. No pneumothorax. IMPRESSION: No  infiltrate or pulmonary edema. No pneumothorax. Left IJ central line in place. Mild infrahilar atelectasis or bronchitic changes. Electronically Signed   By: Lahoma Crocker M.D.   On: 05/10/2015 14:24     STUDIES:  Renal US 1/31 >>> normal  CULTURES: Blood 1/31 >>> Urine 1/31 >>>  ANTIBIOTICS: Zosyn/Vanc in ED 1/31  Rocephin 1/31 >>>  SIGNIFICANT EVENTS: 1/31 admit  LINES/TUBES: L IJ CVC 05/10/15 >>   DISCUSSION: 65 year old male with no PMH. 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 1 year history of BLE edema and wounds. Suffered syncopal episode 1/31 and found to be hypotensive with acute renal failure, hypernatremia, hyperkalemia. Admited to [facility] for medical management, nephrology seeing and helping with metabolic derangements.   ASSESSMENT / PLAN:  PULMONARY A: No acute issues  P:   Supplemental O2 as indicated to keep SpO2 > 92% Incentive spirometry  CARDIOVASCULAR A:  Shock, presumed septic + hypovolemic  P:  Telemetry Will change IVF to NS 2/2, follow CVP to guide rx MAP goal > 6mmHg, wean phenyleprhine   RENAL A:   Acute renal failure Hyperkalemia (given insulin, calcium, lasix, and kayexalate in ED) Hyponatremia chronic, hypovolemic NAG acidosis  P:   Nephrology following , appreciate assistance. Dr Posey Pronto would like to start lasix when able to do so, hesitant to do when he still appears to be septic and possibly intravasc depleted. Will challenge him w lasix when pressor needs improved. Check CVP and let this guide [place] as well.  Stopping bicarb gtt on 2/2.  Serial BMP  q8 hours.  Correct potassium if / when indicated  GASTROINTESTINAL A:   No acute issues  P:   Protonix for SUP  Renal diet  HEMATOLOGIC A:   Anemia in setting renal failure  P:  Follow CBC SQ heparin for VTE ppx  INFECTIOUS A:   Severe sepsis secondary to RLE cellulitis  P:   Cultures and ABX as above (rocephin) If  pressor needs continue then consider possible cx-negative MRSA, ? Change to vanco PCT and WBC suggest infxn  ENDOCRINE A:   No acute issues  P:   Follow glucose on BMP  NEUROLOGIC A:   No acute issues  P:   RASS goal: 0 Monitor  GLOBAL: Patient has father of whom he is primary caregiver. Father is here in hospital and there is no one to keep and eye on him. Goal social work to find alternative care for him while pt is ill  FAMILY  - Updates: patient updated daily by RB  - Inter-disciplinary family meet or Palliative Care meeting due by:  2/6  Independent CC time 40 minutes.   Baltazar Apo, MD, PhD 05/11/2015, 7:54 AM [place] [facility] [phone] or if no answer [phone]

## 2015-05-11 NOTE — Consult Note (Signed)
Reason for Consult:Small bowel obstruction Referring Physician: Janis Cuffe Higinbotham is an 65 y.o. male.  HPI: This is a 65 yo male who has not really sought any medical care in recent history.  He presented with a year of punctate lesions to both lower extremities that has progressed and spread across both legs.  He presented with dyspnea on exertion and syncope.  He was found to be hypotensive with acute kidney injury (Creatinine 5.82, K 7.3, Na 109, Cl 79).  He denies any previous abdominal surgery.  He remains on pressor support with phenylephrine, requiring minimal supplemental oxygen.  Bicarb gtt is off.    His last BM was two days ago.  Today, he became more distended and began vomiting.  Plain films show dilated SB with air in non-dilated colon.  We are asked to consult.    Past Medical History  Diagnosis Date  . GERD (gastroesophageal reflux disease)     Past Surgical History  Procedure Laterality Date  . Cataract extraction      No family history on file.  Social History:  reports that he has never smoked. He does not have any smokeless tobacco history on file. He reports that he does not drink alcohol or use illicit drugs.  Allergies: No Known Allergies  Medications:  Prior to Admission medications   Medication Sig Start Date End Date Taking? Authorizing Provider  aluminum-magnesium hydroxide-simethicone (MAALOX) 194-174-08 MG/5ML SUSP Take 30 mLs by mouth 3 (three) times daily as needed (acid reflux). Acid reflux   Yes Historical Provider, MD     Results for orders placed or performed during the hospital encounter of 05/09/15 (from the past 48 hour(s))  Basic metabolic panel     Status: Abnormal   Collection Time: 05/09/15  5:03 PM  Result Value Ref Range   Sodium 122 (L) 135 - 145 mmol/L   Potassium 4.4 3.5 - 5.1 mmol/L   Chloride 90 (L) 101 - 111 mmol/L   CO2 20 (L) 22 - 32 mmol/L   Glucose, Bld 124 (H) 65 - 99 mg/dL   BUN 144 (H) 6 - 20 mg/dL   Creatinine,  Ser 4.76 (H) 0.61 - 1.24 mg/dL   Calcium 8.5 (L) 8.9 - 10.3 mg/dL   GFR calc non Af Amer 12 (L) >60 mL/min   GFR calc Af Amer 14 (L) >60 mL/min    Comment: (NOTE) The eGFR has been calculated using the CKD EPI equation. This calculation has not been validated in all clinical situations. eGFR's persistently <60 mL/min signify possible Chronic Kidney Disease.    Anion gap 12 5 - 15  Lactic acid, plasma     Status: None   Collection Time: 05/09/15  5:03 PM  Result Value Ref Range   Lactic Acid, Venous 1.6 0.5 - 2.0 mmol/L  Basic metabolic panel     Status: Abnormal   Collection Time: 05/09/15  8:52 PM  Result Value Ref Range   Sodium 120 (L) 135 - 145 mmol/L   Potassium 4.4 3.5 - 5.1 mmol/L   Chloride 89 (L) 101 - 111 mmol/L   CO2 20 (L) 22 - 32 mmol/L   Glucose, Bld 178 (H) 65 - 99 mg/dL   BUN 137 (H) 6 - 20 mg/dL   Creatinine, Ser 4.58 (H) 0.61 - 1.24 mg/dL   Calcium 8.8 (L) 8.9 - 10.3 mg/dL   GFR calc non Af Amer 12 (L) >60 mL/min   GFR calc Af Amer 14 (L) >60 mL/min  Comment: (NOTE) The eGFR has been calculated using the CKD EPI equation. This calculation has not been validated in all clinical situations. eGFR's persistently <60 mL/min signify possible Chronic Kidney Disease.    Anion gap 11 5 - 15  Troponin I     Status: Abnormal   Collection Time: 05/09/15  8:52 PM  Result Value Ref Range   Troponin I 0.07 (H) <0.031 ng/mL    Comment:        PERSISTENTLY INCREASED TROPONIN VALUES IN THE RANGE OF 0.04-0.49 ng/mL CAN BE SEEN IN:       -UNSTABLE ANGINA       -CONGESTIVE HEART FAILURE       -MYOCARDITIS       -CHEST TRAUMA       -ARRYHTHMIAS       -LATE PRESENTING MYOCARDIAL INFARCTION       -COPD   CLINICAL FOLLOW-UP RECOMMENDED.   Basic metabolic panel     Status: Abnormal   Collection Time: 05/09/15 10:08 PM  Result Value Ref Range   Sodium 120 (L) 135 - 145 mmol/L   Potassium 4.7 3.5 - 5.1 mmol/L   Chloride 87 (L) 101 - 111 mmol/L   CO2 21 (L) 22 - 32  mmol/L   Glucose, Bld 188 (H) 65 - 99 mg/dL   BUN 136 (H) 6 - 20 mg/dL   Creatinine, Ser 4.46 (H) 0.61 - 1.24 mg/dL   Calcium 8.9 8.9 - 10.3 mg/dL   GFR calc non Af Amer 13 (L) >60 mL/min   GFR calc Af Amer 15 (L) >60 mL/min    Comment: (NOTE) The eGFR has been calculated using the CKD EPI equation. This calculation has not been validated in all clinical situations. eGFR's persistently <60 mL/min signify possible Chronic Kidney Disease.    Anion gap 12 5 - 15  Basic metabolic panel     Status: Abnormal   Collection Time: 05/10/15  2:22 AM  Result Value Ref Range   Sodium 117 (LL) 135 - 145 mmol/L    Comment: CRITICAL RESULT CALLED TO, READ BACK BY AND VERIFIED WITH: K MATHERLY,RN [id number] WILDERK    Potassium 5.0 3.5 - 5.1 mmol/L   Chloride 87 (L) 101 - 111 mmol/L   CO2 14 (L) 22 - 32 mmol/L   Glucose, Bld 169 (H) 65 - 99 mg/dL   BUN 135 (H) 6 - 20 mg/dL   Creatinine, Ser 4.41 (H) 0.61 - 1.24 mg/dL   Calcium 8.5 (L) 8.9 - 10.3 mg/dL   GFR calc non Af Amer 13 (L) >60 mL/min   GFR calc Af Amer 15 (L) >60 mL/min    Comment: (NOTE) The eGFR has been calculated using the CKD EPI equation. This calculation has not been validated in all clinical situations. eGFR's persistently <60 mL/min signify possible Chronic Kidney Disease.    Anion gap 16 (H) 5 - 15  CBC     Status: Abnormal   Collection Time: 05/10/15  2:22 AM  Result Value Ref Range   WBC 30.7 (H) 4.0 - 10.5 K/uL    Comment: REPEATED TO VERIFY   RBC 4.12 (L) 4.22 - 5.81 MIL/uL   Hemoglobin 13.0 13.0 - 17.0 g/dL   HCT 36.5 (L) 39.0 - 52.0 %   MCV 88.6 78.0 - 100.0 fL   MCH 31.6 26.0 - 34.0 pg   MCHC 35.6 30.0 - 36.0 g/dL   RDW 13.3 11.5 - 15.5 %   Platelets 303 150 - 400 K/uL  Magnesium     Status: Abnormal   Collection Time: 05/10/15  2:22 AM  Result Value Ref Range   Magnesium 5.1 (H) 1.7 - 2.4 mg/dL  Phosphorus     Status: Abnormal   Collection Time: 05/10/15  2:22 AM  Result Value Ref Range   Phosphorus  5.3 (H) 2.5 - 4.6 mg/dL  Procalcitonin     Status: None   Collection Time: 05/10/15  2:22 AM  Result Value Ref Range   Procalcitonin 6.15 ng/mL    Comment:        Interpretation: PCT > 2 ng/mL: Systemic infection (sepsis) is likely, unless other causes are known. (NOTE)         ICU PCT Algorithm               Non ICU PCT Algorithm    ----------------------------     ------------------------------         PCT < 0.25 ng/mL                 PCT < 0.1 ng/mL     Stopping of antibiotics            Stopping of antibiotics       strongly encouraged.               strongly encouraged.    ----------------------------     ------------------------------       PCT level decrease by               PCT < 0.25 ng/mL       >= 80% from peak PCT       OR PCT 0.25 - 0.5 ng/mL          Stopping of antibiotics                                             encouraged.     Stopping of antibiotics           encouraged.    ----------------------------     ------------------------------       PCT level decrease by              PCT >= 0.25 ng/mL       < 80% from peak PCT        AND PCT >= 0.5 ng/mL            Continuing antibiotics                                               encouraged.       Continuing antibiotics            encouraged.    ----------------------------     ------------------------------     PCT level increase compared          PCT > 0.5 ng/mL         with peak PCT AND          PCT >= 0.5 ng/mL             Escalation of antibiotics  strongly encouraged.      Escalation of antibiotics        strongly encouraged.   Basic metabolic panel     Status: Abnormal   Collection Time: 05/10/15  1:45 PM  Result Value Ref Range   Sodium 118 (LL) 135 - 145 mmol/L    Comment: CRITICAL RESULT CALLED TO, READ BACK BY AND VERIFIED WITH: WRIGHT,M RN @ 1446 05/10/15 LEONARD,A    Potassium 5.2 (H) 3.5 - 5.1 mmol/L   Chloride 88 (L) 101 - 111 mmol/L   CO2 22 22 - 32  mmol/L   Glucose, Bld 157 (H) 65 - 99 mg/dL   BUN 129 (H) 6 - 20 mg/dL   Creatinine, Ser 3.95 (H) 0.61 - 1.24 mg/dL   Calcium 8.3 (L) 8.9 - 10.3 mg/dL   GFR calc non Af Amer 15 (L) >60 mL/min   GFR calc Af Amer 17 (L) >60 mL/min    Comment: (NOTE) The eGFR has been calculated using the CKD EPI equation. This calculation has not been validated in all clinical situations. eGFR's persistently <60 mL/min signify possible Chronic Kidney Disease.    Anion gap 8 5 - 15  Basic metabolic panel     Status: Abnormal   Collection Time: 05/10/15  6:10 PM  Result Value Ref Range   Sodium 118 (LL) 135 - 145 mmol/L    Comment: CRITICAL RESULT CALLED TO, READ BACK BY AND VERIFIED WITH: Everitt Amber 1856 05/10/2015 WBOND    Potassium 4.9 3.5 - 5.1 mmol/L   Chloride 85 (L) 101 - 111 mmol/L   CO2 22 22 - 32 mmol/L   Glucose, Bld 150 (H) 65 - 99 mg/dL   BUN 124 (H) 6 - 20 mg/dL   Creatinine, Ser 3.63 (H) 0.61 - 1.24 mg/dL   Calcium 8.2 (L) 8.9 - 10.3 mg/dL   GFR calc non Af Amer 17 (L) >60 mL/min   GFR calc Af Amer 19 (L) >60 mL/min    Comment: (NOTE) The eGFR has been calculated using the CKD EPI equation. This calculation has not been validated in all clinical situations. eGFR's persistently <60 mL/min signify possible Chronic Kidney Disease.    Anion gap 11 5 - 15  Basic metabolic panel     Status: Abnormal   Collection Time: 05/11/15  4:45 AM  Result Value Ref Range   Sodium 119 (LL) 135 - 145 mmol/L    Comment: CRITICAL RESULT CALLED TO, READ BACK BY AND VERIFIED WITH: [facility] 05/11/15 0522 WAYK    Potassium 4.3 3.5 - 5.1 mmol/L   Chloride 80 (L) 101 - 111 mmol/L   CO2 27 22 - 32 mmol/L   Glucose, Bld 126 (H) 65 - 99 mg/dL   BUN 108 (H) 6 - 20 mg/dL   Creatinine, Ser 3.07 (H) 0.61 - 1.24 mg/dL   Calcium 8.5 (L) 8.9 - 10.3 mg/dL   GFR calc non Af Amer 20 (L) >60 mL/min   GFR calc Af Amer 23 (L) >60 mL/min    Comment: (NOTE) The eGFR has been calculated using the CKD EPI  equation. This calculation has not been validated in all clinical situations. eGFR's persistently <60 mL/min signify possible Chronic Kidney Disease.    Anion gap 12 5 - 15  Procalcitonin     Status: None   Collection Time: 05/11/15  4:45 AM  Result Value Ref Range   Procalcitonin 13.77 ng/mL    Comment:        Interpretation: PCT >=  10 ng/mL: Important systemic inflammatory response, almost exclusively due to severe bacterial sepsis or septic shock. (NOTE)         ICU PCT Algorithm               Non ICU PCT Algorithm    ----------------------------     ------------------------------         PCT < 0.25 ng/mL                 PCT < 0.1 ng/mL     Stopping of antibiotics            Stopping of antibiotics       strongly encouraged.               strongly encouraged.    ----------------------------     ------------------------------       PCT level decrease by               PCT < 0.25 ng/mL       >= 80% from peak PCT       OR PCT 0.25 - 0.5 ng/mL          Stopping of antibiotics                                             encouraged.     Stopping of antibiotics           encouraged.    ----------------------------     ------------------------------       PCT level decrease by              PCT >= 0.25 ng/mL       < 80% from peak PCT        AND PCT >= 0.5 ng/mL             Continuing antibiotics                                              encouraged.       Continuing antibiotics            encouraged.    ----------------------------     ------------------------------     PCT level increase compared          PCT > 0.5 ng/mL         with peak PCT AND          PCT >= 0.5 ng/mL             Escalation of antibiotics                                          strongly encouraged.      Escalation of antibiotics        strongly encouraged.   Glucose, capillary     Status: Abnormal   Collection Time: 05/11/15  7:56 AM  Result Value Ref Range   Glucose-Capillary 145 (H) 65 - 99 mg/dL    Comment 1 Capillary Specimen    Comment 2 Notify RN   Basic metabolic panel     Status: Abnormal   Collection Time: 05/11/15  8:20  AM  Result Value Ref Range   Sodium 121 (L) 135 - 145 mmol/L   Potassium 4.7 3.5 - 5.1 mmol/L   Chloride 80 (L) 101 - 111 mmol/L   CO2 29 22 - 32 mmol/L   Glucose, Bld 164 (H) 65 - 99 mg/dL   BUN 100 (H) 6 - 20 mg/dL   Creatinine, Ser 2.91 (H) 0.61 - 1.24 mg/dL   Calcium 8.6 (L) 8.9 - 10.3 mg/dL   GFR calc non Af Amer 22 (L) >60 mL/min   GFR calc Af Amer 25 (L) >60 mL/min    Comment: (NOTE) The eGFR has been calculated using the CKD EPI equation. This calculation has not been validated in all clinical situations. eGFR's persistently <60 mL/min signify possible Chronic Kidney Disease.    Anion gap 12 5 - 15  Glucose, capillary     Status: Abnormal   Collection Time: 05/11/15 12:21 PM  Result Value Ref Range   Glucose-Capillary 123 (H) 65 - 99 mg/dL   Comment 1 Capillary Specimen    Comment 2 Notify RN     Dg Chest Port 1 View  05/10/2015  CLINICAL DATA:  Central line placement, acute respiratory failure EXAM: PORTABLE CHEST 1 VIEW COMPARISON:  05/09/2015 FINDINGS: Cardiomediastinal silhouette is stable. No segmental infiltrate or pulmonary edema. Mild infrahilar atelectasis or bronchitic changes. There is left IJ central line with tip in distal SVC. No pneumothorax. IMPRESSION: No infiltrate or pulmonary edema. No pneumothorax. Left IJ central line in place. Mild infrahilar atelectasis or bronchitic changes. Electronically Signed   By: Lahoma Crocker M.D.   On: 05/10/2015 14:24   Dg Abd Portable 1v  05/11/2015  CLINICAL DATA:  65 year old with acute onset of mid and left upper and lower quadrant abdominal pain which began this morning, associated with bilious vomiting. EXAM: PORTABLE ABDOMEN - 1 VIEW COMPARISON:  None. FINDINGS: Gaseous distention of multiple loops of small bowel in the abdomen and upper pelvis. Gas within normal caliber colon. Moderate gaseous  distention of the stomach. No suggestion of free air on the supine image. IMPRESSION: Small bowel obstruction. Electronically Signed   By: Evangeline Dakin M.D.   On: 05/11/2015 16:02    Review of Systems  Constitutional: Negative for weight loss.  HENT: Negative for ear discharge, ear pain, hearing loss and tinnitus.   Eyes: Negative for blurred vision, double vision, photophobia and pain.  Respiratory: Positive for shortness of breath. Negative for cough and sputum production.   Cardiovascular: Positive for leg swelling. Negative for chest pain.  Gastrointestinal: Positive for nausea, vomiting, abdominal pain and constipation.  Genitourinary: Negative for dysuria, urgency, frequency and flank pain.  Musculoskeletal: Negative for myalgias, back pain, joint pain, falls and neck pain.  Neurological: Positive for weakness. Negative for dizziness, tingling, sensory change, focal weakness, loss of consciousness and headaches.  Endo/Heme/Allergies: Does not bruise/bleed easily.  Psychiatric/Behavioral: Negative for depression, memory loss and substance abuse. The patient is not nervous/anxious.    Blood pressure 78/36, pulse 82, temperature 97.7 F (36.5 C), temperature source Oral, resp. rate 18, height 6' (1.829 m), weight 148.1 kg (326 lb 8 oz), SpO2 99 %. Physical Exam Disheveled male - in NAD Feels much better with NG in place HEENT:  EOMI, sclera anicteric Neck:  No masses, no thyromegaly Lungs:  CTA bilaterally; normal respiratory effort CV:  Regular rate and rhythm; no murmurs Abd:  Distended; minimal bowel sounds; no localized tenderness Ext:  Well-perfused; both lower extremities are edematous - wrapped in  dressings. Skin:  Warm, dry; no sign of jaundice  Assessment/Plan: Dilated small bowel on plain films - small bowel obstruction vs. Possible ileus secondary to metabolic abnormalities.   NG tube has been inserted SBO protocol - NG suction for 2 hours, administer gastrografin.   Follow-up film in 8 hours. If no improvement, consider CT scan to look for etiology - SBO vs. Ileus. Correct electrolyte abnormalities  We will follow.    TSUEI,MATTHEW K. 05/11/2015, 4:58 PM

## 2015-05-11 NOTE — Progress Notes (Signed)
Patient ID: Scott Shelton, male   DOB: [birth year], 65 y.o.   MRN: [id number]  [facility] Progress Note   Assessment/ Plan:   1. Acute renal failure- suspected to be hemodynamically mediated acute renal failure with prerenal azotemia/evolution to ischemic ATN. Urine sediment points away from acute GN and renal ultrasound does not show any obstruction. Maintains good urine output and renal function continues to improve with intravenous fluids. Serology is not back yet 2. Hyperkalemia: Acutely corrected since admission-improved further with augmentation of urine output/post diuresis.   3. Lower extremity lichenification/ulcers: awaiting evaluation by wound care  4. Hyponatremia: Discontinue sodium bicarbonate (primarily because of increased bicarbonate), start normal saline-review CVP and give furosemide if >10. Continue oral fluid restriction.  5. Sepsis: Started on pressors after he remained hypotensive yesterday on intravenous fluids. On broad-spectrum antibiotic therapy for sepsis suspected to be from cellulitis of the lower extremity.  Subjective:   Reports to be feeling fair, was able to get some sleep overnight. Complains of "throwing up acid". Started on pressors overnight    Objective:   BP 102/51 mmHg  Pulse 73  Temp(Src) 97.9 F (36.6 C) (Oral)  Resp 19  Ht 6' (1.829 m)  Wt 148.1 kg (326 lb 8 oz)  BMI 44.27 kg/m2  SpO2 99%  Intake/Output Summary (Last 24 hours) at 05/11/15 0758 Last data filed at 05/11/15 0700  Gross per 24 hour  Intake 4917.71 ml  Output   2610 ml  Net 2307.71 ml   Weight change: 1.3 kg (2 lb 13.9 oz)  Physical Exam: Gen: Appears to be comfortable resting in bed  CVS: Pulse regular in rhythm,  normal rate,S1 and S2 normal Resp: Clear to auscultation bilaterally-no distinct rales or rhonchi Abd: Soft, obese, nontender  Ext: Bilateral lower extremities in Ace wrap   Imaging: Dg Chest 2 View  05/09/2015  CLINICAL DATA:  Speech syncope,  lightheadedness EXAM: CHEST  2 VIEW COMPARISON:  None. FINDINGS: There is mild elevation of the right diaphragm. There is no focal parenchymal opacity. There is no pleural effusion or pneumothorax. The heart and mediastinal contours are unremarkable. The osseous structures are unremarkable. IMPRESSION: No active cardiopulmonary disease. Electronically Signed   By: Kathreen Devoid   On: 05/09/2015 12:30   US Renal  05/09/2015  CLINICAL DATA:  Acute renal failure. EXAM: RENAL / URINARY TRACT ULTRASOUND COMPLETE COMPARISON:  None. FINDINGS: Right Kidney: Length: 11 cm. Echogenicity within normal limits. No mass or hydronephrosis visualized. Left Kidney: Length: 12.8 cm. Echogenicity within normal limits. No mass or hydronephrosis visualized. Bladder: Decompressed secondary to Foley catheter. IMPRESSION: No renal abnormality seen. Electronically Signed   By: Marijo Conception, M.D.   On: 05/09/2015 16:00   Dg Chest Port 1 View  05/10/2015  CLINICAL DATA:  Central line placement, acute respiratory failure EXAM: PORTABLE CHEST 1 VIEW COMPARISON:  05/09/2015 FINDINGS: Cardiomediastinal silhouette is stable. No segmental infiltrate or pulmonary edema. Mild infrahilar atelectasis or bronchitic changes. There is left IJ central line with tip in distal SVC. No pneumothorax. IMPRESSION: No infiltrate or pulmonary edema. No pneumothorax. Left IJ central line in place. Mild infrahilar atelectasis or bronchitic changes. Electronically Signed   By: Lahoma Crocker M.D.   On: 05/10/2015 14:24    Labs: BMET  Recent Labs Lab 05/09/15 1703 05/09/15 2052 05/09/15 2208 05/10/15 0222 05/10/15 1345 05/10/15 1810 05/11/15 0445  NA 122* 120* 120* 117* 118* 118* 119*  K 4.4 4.4 4.7 5.0 5.2* 4.9 4.3  CL 90*  89* 87* 87* 88* 85* 80*  CO2 20* 20* 21* 14* 22 22 27   GLUCOSE 124* 178* 188* 169* 157* 150* 126*  BUN 144* 137* 136* 135* 129* 124* 108*  CREATININE 4.76* 4.58* 4.46* 4.41* 3.95* 3.63* 3.07*  CALCIUM 8.5* 8.8* 8.9 8.5*  8.3* 8.2* 8.5*  PHOS  --   --   --  5.3*  --   --   --    CBC  Recent Labs Lab 05/09/15 1000 05/09/15 1038 05/10/15 0222  WBC 11.5*  --  30.7*  HGB 12.6* 13.9 13.0  HCT 35.9* 41.0 36.5*  MCV 87.6  --  88.6  PLT 306  --  303    Medications:    . cefTRIAXone (ROCEPHIN)  IV  2 g Intravenous Q24H  . Chlorhexidine Gluconate Cloth  6 each Topical Q0600  . heparin  5,000 Units Subcutaneous 3 times per day  . hydrocerin   Topical Daily  . mupirocin ointment  1 application Nasal BID  . pantoprazole  40 mg Oral Q1200  . sodium chloride flush  10-40 mL Intracatheter Q12H   Elmarie Shiley, MD 05/11/2015, 7:58 AM

## 2015-05-11 NOTE — Progress Notes (Signed)
PATIENT ID: [id number] admitted with syncope and found to have sepsis secondary to cellulitis and demand ischemia.  SUBJECTIVE:  Feeling tired but better.  Denies chest pain or shortness of breath.    PHYSICAL EXAM Filed Vitals:   05/11/15 0945 05/11/15 1000 05/11/15 1015 05/11/15 1030  BP: 104/73 96/64 95/56  111/64  Pulse: 78 81 79 66  Temp:      TempSrc:      Resp: 16 20 22 15   Height:      Weight:      SpO2: 98% 98% 98% 99%   General:  Mildly ill-appearing, morbidly obese man in NAD.  Neck: No acute distress. Lungs:  CTAB.  No crackles, rhonchi or wheezes Heart:  RRR.  No m/r/g.  Normal S1/S2.  Distant heart sounds. Abdomen:  Soft, NT, ND.  +BS. Extremities:  Bilateral erythema and edema.  Legs are wrapped.   LABS: Lab Results  Component Value Date   TROPONINI 0.07* 05/09/2015   Results for orders placed or performed during the hospital encounter of 05/09/15 (from the past 24 hour(s))  Basic metabolic panel     Status: Abnormal   Collection Time: 05/10/15  1:45 PM  Result Value Ref Range   Sodium 118 (LL) 135 - 145 mmol/L   Potassium 5.2 (H) 3.5 - 5.1 mmol/L   Chloride 88 (L) 101 - 111 mmol/L   CO2 22 22 - 32 mmol/L   Glucose, Bld 157 (H) 65 - 99 mg/dL   BUN 129 (H) 6 - 20 mg/dL   Creatinine, Ser 3.95 (H) 0.61 - 1.24 mg/dL   Calcium 8.3 (L) 8.9 - 10.3 mg/dL   GFR calc non Af Amer 15 (L) >60 mL/min   GFR calc Af Amer 17 (L) >60 mL/min   Anion gap 8 5 - 15  Basic metabolic panel     Status: Abnormal   Collection Time: 05/10/15  6:10 PM  Result Value Ref Range   Sodium 118 (LL) 135 - 145 mmol/L   Potassium 4.9 3.5 - 5.1 mmol/L   Chloride 85 (L) 101 - 111 mmol/L   CO2 22 22 - 32 mmol/L   Glucose, Bld 150 (H) 65 - 99 mg/dL   BUN 124 (H) 6 - 20 mg/dL   Creatinine, Ser 3.63 (H) 0.61 - 1.24 mg/dL   Calcium 8.2 (L) 8.9 - 10.3 mg/dL   GFR calc non Af Amer 17 (L) >60 mL/min   GFR calc Af Amer 19 (L) >60 mL/min   Anion gap 11 5 - 15  Basic metabolic panel     Status:  Abnormal   Collection Time: 05/11/15  4:45 AM  Result Value Ref Range   Sodium 119 (LL) 135 - 145 mmol/L   Potassium 4.3 3.5 - 5.1 mmol/L   Chloride 80 (L) 101 - 111 mmol/L   CO2 27 22 - 32 mmol/L   Glucose, Bld 126 (H) 65 - 99 mg/dL   BUN 108 (H) 6 - 20 mg/dL   Creatinine, Ser 3.07 (H) 0.61 - 1.24 mg/dL   Calcium 8.5 (L) 8.9 - 10.3 mg/dL   GFR calc non Af Amer 20 (L) >60 mL/min   GFR calc Af Amer 23 (L) >60 mL/min   Anion gap 12 5 - 15  Procalcitonin     Status: None   Collection Time: 05/11/15  4:45 AM  Result Value Ref Range   Procalcitonin 13.77 ng/mL  Glucose, capillary     Status: Abnormal   Collection Time: 05/11/15  7:56 AM  Result Value Ref Range   Glucose-Capillary 145 (H) 65 - 99 mg/dL   Comment 1 Capillary Specimen    Comment 2 Notify RN   Basic metabolic panel     Status: Abnormal   Collection Time: 05/11/15  8:20 AM  Result Value Ref Range   Sodium 121 (L) 135 - 145 mmol/L   Potassium 4.7 3.5 - 5.1 mmol/L   Chloride 80 (L) 101 - 111 mmol/L   CO2 29 22 - 32 mmol/L   Glucose, Bld 164 (H) 65 - 99 mg/dL   BUN 100 (H) 6 - 20 mg/dL   Creatinine, Ser 2.91 (H) 0.61 - 1.24 mg/dL   Calcium 8.6 (L) 8.9 - 10.3 mg/dL   GFR calc non Af Amer 22 (L) >60 mL/min   GFR calc Af Amer 25 (L) >60 mL/min   Anion gap 12 5 - 15    Intake/Output Summary (Last 24 hours) at 05/11/15 1046 Last data filed at 05/11/15 1000  Gross per 24 hour  Intake 5145.1 ml  Output   3055 ml  Net 2090.1 ml    Telemetry: Sinus rhythm with 3 beats of NSVT.  Echo 05/10/15: Study Conclusions  - Left ventricle: Wall thickness was increased in a pattern of moderate LVH. Systolic function was vigorous. The estimated ejection fraction was in the range of 65% to 70%. Wall motion was normal; there were no regional wall motion abnormalities. - Aortic valve: Mildly thickened, mildly calcified leaflets.  ASSESSMENT AND PLAN:  Active Problems:   AKI (acute kidney injury) ([place])   Hyperkalemia    Lactic acidosis   Hypotension   Hypothermia   Hyponatremia   Syncope   Cellulitis   Elevated troponin   ARF (acute renal failure) [facility])   Mr. Murren is a 40M with no significant past medical history here with sepsis secondary to lower extremity cellulitis, acute renal failure, and demand ischemia.  He continues to deny chest pain and EKG was negative for ischemia.  Echo was unremarkable other than moderate LVH.  He likely has grade 1 diastolic dysfunction.  He will need follow up with cardiology as an outpatient to determine if an ischemic evaluation is indicated.  We will sign off.  Please call with any questions or concerns.   Ashely Goosby C. Oval Linsey, MD, [facility] 05/11/2015 10:46 AM

## 2015-05-11 NOTE — Progress Notes (Signed)
Pt belching and c/o "acid" in stomach; pt given PRM Maalox at this time; will cont. To monitor.  Scott Shelton

## 2015-05-12 ENCOUNTER — Inpatient Hospital Stay (HOSPITAL_COMMUNITY): Payer: Self-pay

## 2015-05-12 LAB — BASIC METABOLIC PANEL
ANION GAP: 14 (ref 5–15)
Anion gap: 10 (ref 5–15)
BUN: 82 mg/dL — AB (ref 6–20)
BUN: 57 mg/dL — AB (ref 6–20)
CALCIUM: 9 mg/dL (ref 8.9–10.3)
CO2: 28 mmol/L (ref 22–32)
CO2: 29 mmol/L (ref 22–32)
Calcium: 8.9 mg/dL (ref 8.9–10.3)
Chloride: 84 mmol/L — ABNORMAL LOW (ref 101–111)
Chloride: 90 mmol/L — ABNORMAL LOW (ref 101–111)
Creatinine, Ser: 2.7 mg/dL — ABNORMAL HIGH (ref 0.61–1.24)
Creatinine, Ser: 2.3 mg/dL — ABNORMAL HIGH (ref 0.61–1.24)
GFR calc Af Amer: 27 mL/min — ABNORMAL LOW (ref >60)
GFR calc Af Amer: 33 mL/min — ABNORMAL LOW (ref >60)
GFR, EST NON AFRICAN AMERICAN: 24 mL/min — AB (ref >60)
GFR, EST NON AFRICAN AMERICAN: 29 mL/min — AB (ref >60)
GLUCOSE: 136 mg/dL — AB (ref 65–99)
GLUCOSE: 136 mg/dL — AB (ref 65–99)
POTASSIUM: 3.8 mmol/L (ref 3.5–5.1)
Potassium: 4.7 mmol/L (ref 3.5–5.1)
Sodium: 126 mmol/L — ABNORMAL LOW (ref 135–145)
Sodium: 129 mmol/L — ABNORMAL LOW (ref 135–145)

## 2015-05-12 LAB — KAPPA/LAMBDA LIGHT CHAINS
Kappa free light chain: 77.11 mg/L — ABNORMAL HIGH (ref 3.30–19.40)
Kappa, lambda light chain ratio: 1.02 (ref 0.26–1.65)
Lambda free light chains: 75.87 mg/L — ABNORMAL HIGH (ref 5.71–26.30)

## 2015-05-12 LAB — MPO/PR-3 (ANCA) ANTIBODIES
ANCA Proteinase 3: <3.5 U/mL (ref 0.0–3.5)
Myeloperoxidase Abs: <9 U/mL (ref 0.0–9.0)

## 2015-05-12 LAB — TSH: TSH: 1.293 u[IU]/mL (ref 0.350–4.500)

## 2015-05-12 LAB — ANTINUCLEAR ANTIBODIES, IFA: ANTINUCLEAR ANTIBODIES, IFA: NEGATIVE

## 2015-05-12 LAB — C3 COMPLEMENT: C3 COMPLEMENT: 113 mg/dL (ref 82–167)

## 2015-05-12 LAB — C4 COMPLEMENT: COMPLEMENT C4, BODY FLUID: 26 mg/dL (ref 14–44)

## 2015-05-12 LAB — CORTISOL: CORTISOL PLASMA: 27.6 ug/dL

## 2015-05-12 MED ORDER — IOHEXOL 300 MG/ML  SOLN
25.0000 mL | INTRAMUSCULAR | Status: AC
  Administered 2015-05-12 (×2): 25 mL via ORAL

## 2015-05-12 MED ORDER — SODIUM CHLORIDE 0.9 % IV BOLUS (SEPSIS)
1000.0000 mL | INTRAVENOUS | Status: AC
  Administered 2015-05-12 (×3): 1000 mL via INTRAVENOUS

## 2015-05-12 NOTE — Progress Notes (Signed)
Patient ID: Scott Shelton, male   DOB: 04/15/1950, 65 y.o.   MRN: [id number]  [facility] Progress Note   Assessment/ Plan:   1. Acute renal failure- suspected to be hemodynamically mediated acute renal failure with prerenal azotemia/evolution to ischemic ATN. Urine sediment points away from acute GN and renal ultrasound does not show any obstruction. Maintains good urine output and renal function continues to improve with intravenous fluids. Serologies for acute GN so far negative. We will evaluate for possible hypothyroidism as this may be the underlying etiology for his multiple problems. 2. Sepsis: Started on pressors after he remained hypotensive yesterday on intravenous fluids. On broad-spectrum antibiotic therapy for sepsis suspected to be from cellulitis of the lower extremity 3. Lower extremity lichenification/ulcers: Ongoing evaluation and management per recommendations of wound care 4. Hyponatremia: Improving with normal saline drip-will check TSH level/random cortisol 5. Small bowel obstruction/ileus: Conservative management/evaluation per surgery with CT abdomen/pelvis with oral contrast today.  Subjective:   Problems with ileus/abdominal pain noted overnight-currently nothing by mouth and ongoing surgical evaluation.    Objective:   BP 93/59 mmHg  Pulse 82  Temp(Src) 98 F (36.7 C) (Oral)  Resp 11  Ht 6' (1.829 m)  Wt 144 kg (317 lb 7.4 oz)  BMI 43.05 kg/m2  SpO2 97%  Intake/Output Summary (Last 24 hours) at 05/12/15 [id number] Last data filed at 05/12/15 0700  Gross per 24 hour  Intake 4613.89 ml  Output   6540 ml  Net -1926.11 ml   Weight change: -4.1 kg (-9 lb 0.6 oz)  Physical Exam: Gen: Appears to be comfortable resting in recliner  CVS: Pulse regular in rhythm,  normal rate,S1 and S2 normal Resp: Clear to auscultation bilaterally-no distinct rales or rhonchi Abd: Soft, obese, nontender  Ext: Bilateral lower extremities in Ace wrap   Imaging: Dg  Abd 1 View  05/11/2015  CLINICAL DATA:  Evaluate nasogastric tube placement EXAM: ABDOMEN - 1 VIEW COMPARISON:  May 11, 2015 FINDINGS: Nasogastric tube is identified with distal tip in the stomach. There are multiple air-filled dilated small bowel loops. Left central venous line is identified with distal tip in the superior vena cava. IMPRESSION: Nasogastric tube distal tip in the stomach. Small bowel obstruction. Electronically Signed   By: Abelardo Diesel M.D.   On: 05/11/2015 17:43   Dg Chest Port 1 View  05/10/2015  CLINICAL DATA:  Central line placement, acute respiratory failure EXAM: PORTABLE CHEST 1 VIEW COMPARISON:  05/09/2015 FINDINGS: Cardiomediastinal silhouette is stable. No segmental infiltrate or pulmonary edema. Mild infrahilar atelectasis or bronchitic changes. There is left IJ central line with tip in distal SVC. No pneumothorax. IMPRESSION: No infiltrate or pulmonary edema. No pneumothorax. Left IJ central line in place. Mild infrahilar atelectasis or bronchitic changes. Electronically Signed   By: Lahoma Crocker M.D.   On: 05/10/2015 14:24   Dg Abd Portable 1v-small Bowel Obstruction Protocol-initial, 8 Hr Delay  05/12/2015  CLINICAL DATA:  NG tube evaluation. EXAM: PORTABLE ABDOMEN - 1 VIEW COMPARISON:  05/11/2015. FINDINGS: NG tube is noted coiled in stomach. Contrast is noted in the stomach. Persistent severely distended loops of small bowel and possibly large bowel again noted. Mild bowel wall thickening in noted in a right lower quadrant bowel loop. This can be seen with inflammatory or ischemic change. No free air identified . IMPRESSION: 1. NG tube noted coiled stomach. Contrast is noted within the stomach. 2. Persistent severely distended loops of small bowel and possibly large bowel again noted. No  interim improvement. Thickening of bowel wall is noted a right lower quadrant bowel loop. This can be seen with inflammatory or ischemic change. No free air identified. Electronically Signed    By: Marcello Moores  Register   On: 05/12/2015 07:15   Dg Abd Portable 1v  05/11/2015  CLINICAL DATA:  65 year old with acute onset of mid and left upper and lower quadrant abdominal pain which began this morning, associated with bilious vomiting. EXAM: PORTABLE ABDOMEN - 1 VIEW COMPARISON:  None. FINDINGS: Gaseous distention of multiple loops of small bowel in the abdomen and upper pelvis. Gas within normal caliber colon. Moderate gaseous distention of the stomach. No suggestion of free air on the supine image. IMPRESSION: Small bowel obstruction. Electronically Signed   By: Evangeline Dakin M.D.   On: 05/11/2015 16:02    Labs: BMET  Recent Labs Lab 05/10/15 0222 05/10/15 1345 05/10/15 1810 05/11/15 0445 05/11/15 0820 05/11/15 1715 05/12/15 0152  NA 117* 118* 118* 119* 121* 123* 126*  K 5.0 5.2* 4.9 4.3 4.7 4.4 4.7  CL 87* 88* 85* 80* 80* 81* 84*  CO2 14* 22 22 27 29 28 28   GLUCOSE 169* 157* 150* 126* 164* 122* 136*  BUN 135* 129* 124* 108* 100* 91* 82*  CREATININE 4.41* 3.95* 3.63* 3.07* 2.91* 2.87* 2.70*  CALCIUM 8.5* 8.3* 8.2* 8.5* 8.6* 8.7* 9.0  PHOS 5.3*  --   --   --   --   --   --    CBC  Recent Labs Lab 05/09/15 1000 05/09/15 1038 05/10/15 0222  WBC 11.5*  --  30.7*  HGB 12.6* 13.9 13.0  HCT 35.9* 41.0 36.5*  MCV 87.6  --  88.6  PLT 306  --  303    Medications:    . cefTRIAXone (ROCEPHIN)  IV  2 g Intravenous Q24H  . Chlorhexidine Gluconate Cloth  6 each Topical Q0600  . docusate sodium  100 mg Oral Daily  . heparin  5,000 Units Subcutaneous 3 times per day  . hydrocerin   Topical Daily  . mupirocin ointment  1 application Nasal BID  . pantoprazole  40 mg Oral Q1200  . sodium chloride flush  10-40 mL Intracatheter Q12H   Elmarie Shiley, MD 05/12/2015, 8:11 AM

## 2015-05-12 NOTE — Progress Notes (Signed)
PULMONARY / CRITICAL CARE MEDICINE   Name: Scott Shelton MRN: [id number] DOB: 24-Jul-1950    ADMISSION DATE:  05/09/2015 CONSULTATION DATE:  05/09/2015  REFERRING MD:  Dr. Claudine Mouton EDP  CHIEF COMPLAINT:  syncope  HISTORY OF PRESENT ILLNESS:   65 year old male with no significant past medical history. He has neglected his own medical care as he is the primary caregiver for his father who has advanced dementia. His health for the most part has been "pretty good" with the exception of a cataract surgery. About one year ago he developed small red punctate lesions to bilateral lower extremities from the calves down. These lesions began to spread and overwhelmed his the entirety of this area. He managed this himself without medical attention, but it seems like he has not been doing much for them. Over the past few weeks he has complained on increased DOE with simple tasks that would have been easy for him before. Denies cough, chest pain, and SOB at rest. 1/31 he was standing next to the couch suffered a syncopal episode during which he fell back onto the couch into a seated position. He awoke spontaneously about 3-4 minutes later. He presented to ED with this complaint. In the emergency department he was found to be hypotensive 94/80 and hypothermic 96.30F. Laboratory evaluation significant for Creatinine 5.82, K 7.3, Na 109, Cl 79. Also had some mild leukocytosis with WBC 11.5. Seen by nephrology in ED, PCCM to admit.  SUBJECTIVE:  Progressive and persistent nausea 2/2, KUB and clinically consistent w ileus. Felt better after placement NGT. No BM x 3 days, no flatus Phenylephrine changed to norepi last night Up to chair, feels better  VITAL SIGNS: BP 93/59 mmHg  Pulse 82  Temp(Src) 98 F (36.7 C) (Oral)  Resp 11  Ht 6' (1.829 m)  Wt 144 kg (317 lb 7.4 oz)  BMI 43.05 kg/m2  SpO2 97%  HEMODYNAMICS: CVP:  [4 mmHg-12 mmHg] 10 mmHg  VENTILATOR SETTINGS:    INTAKE / OUTPUT: I/O last 3 completed  shifts: In: 7425 [P.O.:420; I.V.:6775; NG/GT:180; IV Piggyback:50] Out: 8465 [Urine:5965; Emesis/NG output:2500]  PHYSICAL EXAMINATION: General:  Obese male in NAD sitting up in chair Neuro:  Alert, oriented, non-focal HEENT:  [place]/AT, PERRL, no JVD Cardiovascular:  RRR, no MRG Lungs:  Clear bilateral breath sounds, mildly increased WOB Abdomen:  Soft, slightly distended. No pain. Rare bowel sounds Musculoskeletal:  No acute deformity Skin:  LLE edematous and dry. RLE edematous, erythema, areas of ulceration.  LABS:  BMET  Recent Labs Lab 05/11/15 0820 05/11/15 1715 05/12/15 0152  NA 121* 123* 126*  K 4.7 4.4 4.7  CL 80* 81* 84*  CO2 29 28 28   BUN 100* 91* 82*  CREATININE 2.91* 2.87* 2.70*  GLUCOSE 164* 122* 136*    Electrolytes  Recent Labs Lab 05/10/15 0222  05/11/15 0820 05/11/15 1715 05/12/15 0152  CALCIUM 8.5*  < > 8.6* 8.7* 9.0  MG 5.1*  --   --   --   --   PHOS 5.3*  --   --   --   --   < > = values in this interval not displayed.  CBC  Recent Labs Lab 05/09/15 1000 05/09/15 1038 05/10/15 0222  WBC 11.5*  --  30.7*  HGB 12.6* 13.9 13.0  HCT 35.9* 41.0 36.5*  PLT 306  --  303    Coag's No results for input(s): APTT, INR in the last 168 hours.  Sepsis Markers  Recent Labs  Lab 05/09/15 1450 05/09/15 1502 05/09/15 1703 05/10/15 0222 05/11/15 0445  LATICACIDVEN 2.5* 2.49* 1.6  --   --   PROCALCITON 0.19  --   --  6.15 13.77    ABG No results for input(s): PHART, PCO2ART, PO2ART in the last 168 hours.  Liver Enzymes No results for input(s): AST, ALT, ALKPHOS, BILITOT, ALBUMIN in the last 168 hours.  Cardiac Enzymes  Recent Labs Lab 05/09/15 1145 05/09/15 1450 05/09/15 2052  TROPONINI 0.07* 0.08* 0.07*    Glucose  Recent Labs Lab 05/09/15 1001 05/09/15 1239 05/11/15 0756 05/11/15 1221 05/11/15 1917  GLUCAP 122* 116* 145* 123* 104*    Imaging Dg Abd 1 View  05/11/2015  CLINICAL DATA:  Evaluate nasogastric tube  placement EXAM: ABDOMEN - 1 VIEW COMPARISON:  May 11, 2015 FINDINGS: Nasogastric tube is identified with distal tip in the stomach. There are multiple air-filled dilated small bowel loops. Left central venous line is identified with distal tip in the superior vena cava. IMPRESSION: Nasogastric tube distal tip in the stomach. Small bowel obstruction. Electronically Signed   By: Abelardo Diesel M.D.   On: 05/11/2015 17:43   Dg Abd Portable 1v-small Bowel Obstruction Protocol-initial, 8 Hr Delay  05/12/2015  CLINICAL DATA:  NG tube evaluation. EXAM: PORTABLE ABDOMEN - 1 VIEW COMPARISON:  05/11/2015. FINDINGS: NG tube is noted coiled in stomach. Contrast is noted in the stomach. Persistent severely distended loops of small bowel and possibly large bowel again noted. Mild bowel wall thickening in noted in a right lower quadrant bowel loop. This can be seen with inflammatory or ischemic change. No free air identified . IMPRESSION: 1. NG tube noted coiled stomach. Contrast is noted within the stomach. 2. Persistent severely distended loops of small bowel and possibly large bowel again noted. No interim improvement. Thickening of bowel wall is noted a right lower quadrant bowel loop. This can be seen with inflammatory or ischemic change. No free air identified. Electronically Signed   By: Marcello Moores  Register   On: 05/12/2015 07:15   Dg Abd Portable 1v  05/11/2015  CLINICAL DATA:  65 year old with acute onset of mid and left upper and lower quadrant abdominal pain which began this morning, associated with bilious vomiting. EXAM: PORTABLE ABDOMEN - 1 VIEW COMPARISON:  None. FINDINGS: Gaseous distention of multiple loops of small bowel in the abdomen and upper pelvis. Gas within normal caliber colon. Moderate gaseous distention of the stomach. No suggestion of free air on the supine image. IMPRESSION: Small bowel obstruction. Electronically Signed   By: Evangeline Dakin M.D.   On: 05/11/2015 16:02     STUDIES:  Renal  US 1/31 >>> normal TTE 2/1 >> moderate LVH, normal EF 65-70%, normal wall-motion, normal LA, normal RV size and fxn   CULTURES: Blood 1/31 >>> Urine 1/31 >>> multiple morphologies  ANTIBIOTICS: Zosyn/Vanc in ED 1/31  Rocephin 1/31 >>>  SIGNIFICANT EVENTS: 1/31 admit  LINES/TUBES: L IJ CVC 05/10/15 >>   DISCUSSION: 65 year old male with no PMH. 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 1 year history of BLE edema and wounds. Suffered syncopal episode 1/31 and found to be hypotensive with acute renal failure, hypernatremia, hyperkalemia. Admited to [facility] for medical management, nephrology seeing and helping with metabolic derangements.   ASSESSMENT / PLAN:  PULMONARY A: No acute issues  P:   Supplemental O2 as indicated to keep SpO2 > 92% Incentive spirometry  CARDIOVASCULAR A:  Shock, presumed  septic + hypovolemic  P:  Telemetry Will change IVF to NS 2/2, follow CVP to guide rx MAP goal > 110mmHg, wean norepi  RENAL A:   Acute renal failure Hyperkalemia (given insulin, calcium, lasix, and kayexalate in ED) Hyponatremia chronic, hypovolemic NAG acidosis  P:   Nephrology following, appreciate assistance.  Appears to be in post-ATN diuresis phase of his renal injury > note net negative 2L yesterday 05/11/15 despite NS 125cc/h  (with gastric suction also). I believe we need to replace these losses in order to wean his norepi. Will give NS bolus x 2 (Na now 126 so should be safe to do so) Stopped bicarb gtt on 2/2.  Change BMP's to q12h Correct potassium if / when indicated  GASTROINTESTINAL A:   Ileus  P:   NGT in place, will attempt to clamp if/when he has BM Plan for CT abd/pelvis this am  CCS consulted 2/2 Protonix for SUP, change to IV Renal diet on hold for now  HEMATOLOGIC A:   Anemia in setting renal failure  P:  Follow CBC SQ heparin for VTE ppx  INFECTIOUS A:   Severe sepsis secondary to RLE  cellulitis  P:   Cultures and ABX as above (rocephin) If pressor needs continue then consider possible cx-negative MRSA, ? Change to vanco. Avoiding in setting renal failure Appreciate wound care consult Consider LE doppler studies   ENDOCRINE A:   No acute issues  P:   Follow glucose on BMP  NEUROLOGIC A:   No acute issues  P:   RASS goal: 0 Monitor  GLOBAL: Patient has father of whom he is primary caregiver. Father is here in hospital and there is no one to keep and eye on him. Social work finding alternative care for him while pt is ill  FAMILY  - Updates: patient updated daily by RB  - Inter-disciplinary family meet or Palliative Care meeting due by:  2/6  Independent CC time 40 minutes.   Baltazar Apo, MD, PhD 05/12/2015, 8:48 AM [facility] [phone] or if no answer [phone]

## 2015-05-12 NOTE — Progress Notes (Signed)
Dr Reece Agar notified that CT abdomen results are back and it shows probable ileus. RN asked him to look at results.

## 2015-05-12 NOTE — Progress Notes (Signed)
Patient ID: Scott Shelton, male   DOB: [birth year], 65 y.o.   MRN: [id number]     [facility]      [address] [place], [place] [address]    Phone: [phone] FAX: [phone]     Subjective: Sitting up in chair.  No n/v.  2563m out.  Not sure if he's passed flatus, doesn't think so. AXR shows contrast in the stomach without any progression.  Neo switched to levo for bp support.  k 4.7.  sCr 2.7.     Objective:  Vital signs:  Filed Vitals:   05/12/15 0645 05/12/15 0700 05/12/15 0715 05/12/15 0732  BP: 98/58 86/55 93/59   Pulse: 93 89 82   Temp:    98 F (36.7 C)  TempSrc:    Oral  Resp: _0 Height:      Weight:      SpO2: 98% 92% 97%     Last BM Date: 05/09/15  Intake/Output   Yesterday:  02/02 0701 - 02/03 0700 In: 4808.9 [P.O.:180; I.V.:4398.9; NG/GT:180; IV Piggyback:50] Out: 66468[Urine:4315; Emesis/NG output:2500] This shift:     Physical Exam: General: Pt awake/alert/oriented x4 in no acute distress  Abdomen: abdomen is distended, non tender.  No hernias. No visible scars.     Problem List:   Active Problems:   AKI (acute kidney injury) ([place]   Hyperkalemia   Lactic acidosis   Hypotension   Hypothermia   Hyponatremia   Syncope   Cellulitis   Elevated troponin   ARF (acute renal failure) ([facility]    Results:   Labs: Results for orders placed or performed during the hospital encounter of 05/09/15 (from the past 48 hour(s))  Basic metabolic panel     Status: Abnormal   Collection Time: 05/10/15  1:45 PM  Result Value Ref Range   Sodium 118 (LL) 135 - 145 mmol/L    Comment: CRITICAL RESULT CALLED TO, READ BACK BY AND VERIFIED WITH: WRIGHT,M RN @ 1446 05/10/15 LEONARD,A    Potassium 5.2 (H) 3.5 - 5.1 mmol/L   Chloride 88 (L) 101 - 111 mmol/L   CO2 22 22 - 32 mmol/L   Glucose, Bld 157 (H) 65 - 99 mg/dL   BUN 129 (H) 6 - 20 mg/dL   Creatinine, Ser 3.95 (H) 0.61 - 1.24 mg/dL   Calcium  8.3 (L) 8.9 - 10.3 mg/dL   GFR calc non Af Amer 15 (L) >60 mL/min   GFR calc Af Amer 17 (L) >60 mL/min    Comment: (NOTE) The eGFR has been calculated using the CKD EPI equation. This calculation has not been validated in all clinical situations. eGFR's persistently <60 mL/min signify possible Chronic Kidney Disease.    Anion gap 8 5 - 15  Basic metabolic panel     Status: Abnormal   Collection Time: 05/10/15  6:10 PM  Result Value Ref Range   Sodium 118 (LL) 135 - 145 mmol/L    Comment: CRITICAL RESULT CALLED TO, READ BACK BY AND VERIFIED WITH: J WOODY,RN 1856 05/10/2015 WBOND    Potassium 4.9 3.5 - 5.1 mmol/L   Chloride 85 (L) 101 - 111 mmol/L   CO2 22 22 - 32 mmol/L   Glucose, Bld 150 (H) 65 - 99 mg/dL   BUN 124 (H) 6 - 20 mg/dL   Creatinine, Ser 3.63 (H) 0.61 - 1.24 mg/dL   Calcium 8.2 (L) 8.9 - 10.3 mg/dL   GFR calc  non Af Amer 17 (L) >60 mL/min   GFR calc Af Amer 19 (L) >60 mL/min    Comment: (NOTE) The eGFR has been calculated using the CKD EPI equation. This calculation has not been validated in all clinical situations. eGFR's persistently <60 mL/min signify possible Chronic Kidney Disease.    Anion gap 11 5 - 15  Basic metabolic panel     Status: Abnormal   Collection Time: 05/11/15  4:45 AM  Result Value Ref Range   Sodium 119 (LL) 135 - 145 mmol/L    Comment: CRITICAL RESULT CALLED TO, READ BACK BY AND VERIFIED WITH: COUNCIL W,RN 05/11/15 0522 WAYK    Potassium 4.3 3.5 - 5.1 mmol/L   Chloride 80 (L) 101 - 111 mmol/L   CO2 27 22 - 32 mmol/L   Glucose, Bld 126 (H) 65 - 99 mg/dL   BUN 108 (H) 6 - 20 mg/dL   Creatinine, Ser 3.07 (H) 0.61 - 1.24 mg/dL   Calcium 8.5 (L) 8.9 - 10.3 mg/dL   GFR calc non Af Amer 20 (L) >60 mL/min   GFR calc Af Amer 23 (L) >60 mL/min    Comment: (NOTE) The eGFR has been calculated using the CKD EPI equation. This calculation has not been validated in all clinical situations. eGFR's persistently <60 mL/min signify possible Chronic  Kidney Disease.    Anion gap 12 5 - 15  Procalcitonin     Status: None   Collection Time: 05/11/15  4:45 AM  Result Value Ref Range   Procalcitonin 13.77 ng/mL    Comment:        Interpretation: PCT >= 10 ng/mL: Important systemic inflammatory response, almost exclusively due to severe bacterial sepsis or septic shock. (NOTE)         ICU PCT Algorithm               Non ICU PCT Algorithm    ----------------------------     ------------------------------         PCT < 0.25 ng/mL                 PCT < 0.1 ng/mL     Stopping of antibiotics            Stopping of antibiotics       strongly encouraged.               strongly encouraged.    ----------------------------     ------------------------------       PCT level decrease by               PCT < 0.25 ng/mL       >= 80% from peak PCT       OR PCT 0.25 - 0.5 ng/mL          Stopping of antibiotics                                             encouraged.     Stopping of antibiotics           encouraged.    ----------------------------     ------------------------------       PCT level decrease by              PCT >= 0.25 ng/mL       < 80% from peak PCT          AND PCT >= 0.5 ng/mL             Continuing antibiotics                                              encouraged.       Continuing antibiotics            encouraged.    ----------------------------     ------------------------------     PCT level increase compared          PCT > 0.5 ng/mL         with peak PCT AND          PCT >= 0.5 ng/mL             Escalation of antibiotics                                          strongly encouraged.      Escalation of antibiotics        strongly encouraged.   Glucose, capillary     Status: Abnormal   Collection Time: 05/11/15  7:56 AM  Result Value Ref Range   Glucose-Capillary 145 (H) 65 - 99 mg/dL   Comment 1 Capillary Specimen    Comment 2 Notify RN   Basic metabolic panel     Status: Abnormal   Collection Time: 05/11/15  8:20  AM  Result Value Ref Range   Sodium 121 (L) 135 - 145 mmol/L   Potassium 4.7 3.5 - 5.1 mmol/L   Chloride 80 (L) 101 - 111 mmol/L   CO2 29 22 - 32 mmol/L   Glucose, Bld 164 (H) 65 - 99 mg/dL   BUN 100 (H) 6 - 20 mg/dL   Creatinine, Ser 2.91 (H) 0.61 - 1.24 mg/dL   Calcium 8.6 (L) 8.9 - 10.3 mg/dL   GFR calc non Af Amer 22 (L) >60 mL/min   GFR calc Af Amer 25 (L) >60 mL/min    Comment: (NOTE) The eGFR has been calculated using the CKD EPI equation. This calculation has not been validated in all clinical situations. eGFR's persistently <60 mL/min signify possible Chronic Kidney Disease.    Anion gap 12 5 - 15  Glucose, capillary     Status: Abnormal   Collection Time: 05/11/15 12:21 PM  Result Value Ref Range   Glucose-Capillary 123 (H) 65 - 99 mg/dL   Comment 1 Capillary Specimen    Comment 2 Notify RN   Basic metabolic panel     Status: Abnormal   Collection Time: 05/11/15  5:15 PM  Result Value Ref Range   Sodium 123 (L) 135 - 145 mmol/L   Potassium 4.4 3.5 - 5.1 mmol/L   Chloride 81 (L) 101 - 111 mmol/L   CO2 28 22 - 32 mmol/L   Glucose, Bld 122 (H) 65 - 99 mg/dL   BUN 91 (H) 6 - 20 mg/dL   Creatinine, Ser 2.87 (H) 0.61 - 1.24 mg/dL   Calcium 8.7 (L) 8.9 - 10.3 mg/dL   GFR calc non Af Amer 22 (L) >60 mL/min   GFR calc Af Amer 25 (L) >60 mL/min    Comment: (NOTE) The eGFR has been calculated using the CKD EPI equation. This calculation has not   been validated in all clinical situations. eGFR's persistently <60 mL/min signify possible Chronic Kidney Disease.    Anion gap 14 5 - 15  Glucose, capillary     Status: Abnormal   Collection Time: 05/11/15  7:17 PM  Result Value Ref Range   Glucose-Capillary 104 (H) 65 - 99 mg/dL   Comment 1 Capillary Specimen    Comment 2 Notify RN    Comment 3 Document in Chart   Basic metabolic panel     Status: Abnormal   Collection Time: 05/12/15  1:52 AM  Result Value Ref Range   Sodium 126 (L) 135 - 145 mmol/L   Potassium 4.7 3.5  - 5.1 mmol/L    Comment: SPECIMEN HEMOLYZED. HEMOLYSIS MAY AFFECT INTEGRITY OF RESULTS.   Chloride 84 (L) 101 - 111 mmol/L   CO2 28 22 - 32 mmol/L   Glucose, Bld 136 (H) 65 - 99 mg/dL   BUN 82 (H) 6 - 20 mg/dL   Creatinine, Ser 2.70 (H) 0.61 - 1.24 mg/dL   Calcium 9.0 8.9 - 10.3 mg/dL   GFR calc non Af Amer 24 (L) >60 mL/min   GFR calc Af Amer 27 (L) >60 mL/min    Comment: (NOTE) The eGFR has been calculated using the CKD EPI equation. This calculation has not been validated in all clinical situations. eGFR's persistently <60 mL/min signify possible Chronic Kidney Disease.    Anion gap 14 5 - 15    Imaging / Studies: Dg Abd 1 View  05/11/2015  CLINICAL DATA:  Evaluate nasogastric tube placement EXAM: ABDOMEN - 1 VIEW COMPARISON:  May 11, 2015 FINDINGS: Nasogastric tube is identified with distal tip in the stomach. There are multiple air-filled dilated small bowel loops. Left central venous line is identified with distal tip in the superior vena cava. IMPRESSION: Nasogastric tube distal tip in the stomach. Small bowel obstruction. Electronically Signed   By: Abelardo Diesel M.D.   On: 05/11/2015 17:43   Dg Chest Port 1 View  05/10/2015  CLINICAL DATA:  Central line placement, acute respiratory failure EXAM: PORTABLE CHEST 1 VIEW COMPARISON:  05/09/2015 FINDINGS: Cardiomediastinal silhouette is stable. No segmental infiltrate or pulmonary edema. Mild infrahilar atelectasis or bronchitic changes. There is left IJ central line with tip in distal SVC. No pneumothorax. IMPRESSION: No infiltrate or pulmonary edema. No pneumothorax. Left IJ central line in place. Mild infrahilar atelectasis or bronchitic changes. Electronically Signed   By: Lahoma Crocker M.D.   On: 05/10/2015 14:24   Dg Abd Portable 1v-small Bowel Obstruction Protocol-initial, 8 Hr Delay  05/12/2015  CLINICAL DATA:  NG tube evaluation. EXAM: PORTABLE ABDOMEN - 1 VIEW COMPARISON:  05/11/2015. FINDINGS: NG tube is noted coiled in  stomach. Contrast is noted in the stomach. Persistent severely distended loops of small bowel and possibly large bowel again noted. Mild bowel wall thickening in noted in a right lower quadrant bowel loop. This can be seen with inflammatory or ischemic change. No free air identified . IMPRESSION: 1. NG tube noted coiled stomach. Contrast is noted within the stomach. 2. Persistent severely distended loops of small bowel and possibly large bowel again noted. No interim improvement. Thickening of bowel wall is noted a right lower quadrant bowel loop. This can be seen with inflammatory or ischemic change. No free air identified. Electronically Signed   By: Marcello Moores  Register   On: 05/12/2015 07:15   Dg Abd Portable 1v  05/11/2015  CLINICAL DATA:  65 year old with acute onset of mid and left  upper and lower quadrant abdominal pain which began this morning, associated with bilious vomiting. EXAM: PORTABLE ABDOMEN - 1 VIEW COMPARISON:  None. FINDINGS: Gaseous distention of multiple loops of small bowel in the abdomen and upper pelvis. Gas within normal caliber colon. Moderate gaseous distention of the stomach. No suggestion of free air on the supine image. IMPRESSION: Small bowel obstruction. Electronically Signed   By: Evangeline Dakin M.D.   On: 05/11/2015 16:02    Medications / Allergies:  Scheduled Meds: . cefTRIAXone (ROCEPHIN)  IV  2 g Intravenous Q24H  . Chlorhexidine Gluconate Cloth  6 each Topical Q0600  . docusate sodium  100 mg Oral Daily  . heparin  5,000 Units Subcutaneous 3 times per day  . hydrocerin   Topical Daily  . mupirocin ointment  1 application Nasal BID  . pantoprazole  40 mg Oral Q1200  . sodium chloride flush  10-40 mL Intracatheter Q12H   Continuous Infusions: . sodium chloride 125 mL/hr at 05/12/15 0600  . norepinephrine (LEVOPHED) Adult infusion 20 mcg/min (05/12/15 0602)  . phenylephrine (NEO-SYNEPHRINE) Adult infusion Stopped (05/12/15 0602)   PRN Meds:.acetaminophen, alum  & mag hydroxide-simeth, bisacodyl, fentaNYL (SUBLIMAZE) injection, ondansetron (ZOFRAN) IV, phenol, sodium chloride flush  Antibiotics: Anti-infectives    Start     Dose/Rate Route Frequency Ordered Stop   05/09/15 1800  piperacillin-tazobactam (ZOSYN) IVPB 2.25 g  Status:  Discontinued     2.25 g 100 mL/hr over 30 Minutes Intravenous 4 times per day 05/09/15 1056 05/09/15 1317   05/09/15 1400  cefTRIAXone (ROCEPHIN) 2 g in dextrose 5 % 50 mL IVPB     2 g 100 mL/hr over 30 Minutes Intravenous Every 24 hours 05/09/15 1352     05/09/15 1045  piperacillin-tazobactam (ZOSYN) IVPB 3.375 g     3.375 g 100 mL/hr over 30 Minutes Intravenous  Once 05/09/15 1035 05/09/15 1124   05/09/15 1045  vancomycin (VANCOCIN) 2,000 mg in sodium chloride 0.9 % 500 mL IVPB  Status:  Discontinued     2,000 mg 250 mL/hr over 120 Minutes Intravenous  Once 05/09/15 1036 05/09/15 1329        Assessment/Plan SBO-no progression of contrast on AXR.  Denies previous abdominal surgeries, on levo, sepsis, renal failure, could certainly have an ileus rather than a bowel obstruction. Will proceed with a CT of abdomen and pelvis with oral contrast.   Erby Pian, ANP-BC [facility] Pager [phone](7A-4:30P) For consults and floor pages call [phone](7A-4:30P)  05/12/2015 7:55 AM

## 2015-05-12 NOTE — Consult Note (Signed)
WOC wound consult note Reason for Consult: new area right foot open and draining. Patient seen by my partner earlier this week but now patient has open wound.  Palpable pulses noted in this foot. Wound type: venous stasis and lymphedema changes  Measurement: right dorsal foot 3.0cm x 2.5cm x 0.2cm  Wound bed: 20% yellow fibrin, 80% pink, one area just proximal to this is partial thickness skin loww Drainage (amount, consistency, odor) serous Periwound: cellulitis with edema Dressing procedure/placement/frequency: Will add nonadherent with bismuth (Xeroform) to attempt to dry area and the antibacterial will be helpful as well. Continue to wrap legs with kerlix and ACE wraps from first metatarsal head to the patellar notch. Change daily, with previous dressing change orders still in place for Eucerin and kerlix  WOC will reassess wound Monday if patient still inpatient, may need [facility] for wound care.  Conley Delisle [place] RN,CWOCN [phone]

## 2015-05-13 DIAGNOSIS — E876 Hypokalemia: Secondary | ICD-10-CM

## 2015-05-13 LAB — BASIC METABOLIC PANEL
ANION GAP: 9 (ref 5–15)
Anion gap: 11 (ref 5–15)
BUN: 45 mg/dL — ABNORMAL HIGH (ref 6–20)
BUN: 39 mg/dL — ABNORMAL HIGH (ref 6–20)
CALCIUM: 9.1 mg/dL (ref 8.9–10.3)
CALCIUM: 9.5 mg/dL (ref 8.9–10.3)
CHLORIDE: 94 mmol/L — AB (ref 101–111)
CO2: 29 mmol/L (ref 22–32)
CO2: 29 mmol/L (ref 22–32)
CREATININE: 1.98 mg/dL — AB (ref 0.61–1.24)
Chloride: 93 mmol/L — ABNORMAL LOW (ref 101–111)
Creatinine, Ser: 2.01 mg/dL — ABNORMAL HIGH (ref 0.61–1.24)
GFR calc non Af Amer: 34 mL/min — ABNORMAL LOW (ref >60)
GFR, EST AFRICAN AMERICAN: 39 mL/min — AB (ref >60)
GFR, EST AFRICAN AMERICAN: 40 mL/min — AB (ref >60)
GFR, EST NON AFRICAN AMERICAN: 34 mL/min — AB (ref >60)
GLUCOSE: 111 mg/dL — AB (ref 65–99)
Glucose, Bld: 101 mg/dL — ABNORMAL HIGH (ref 65–99)
Potassium: 3.3 mmol/L — ABNORMAL LOW (ref 3.5–5.1)
Potassium: 3.7 mmol/L (ref 3.5–5.1)
SODIUM: 131 mmol/L — AB (ref 135–145)
SODIUM: 134 mmol/L — AB (ref 135–145)

## 2015-05-13 MED ORDER — AMIODARONE HCL IN DEXTROSE 360-4.14 MG/200ML-% IV SOLN
60.0000 mg/h | INTRAVENOUS | Status: AC
  Administered 2015-05-13: 60 mg/h via INTRAVENOUS

## 2015-05-13 MED ORDER — AMIODARONE HCL IN DEXTROSE 360-4.14 MG/200ML-% IV SOLN
30.0000 mg/h | INTRAVENOUS | Status: DC
Start: 2015-05-13 — End: 2015-05-16
  Administered 2015-05-13 – 2015-05-16 (×7): 30 mg/h via INTRAVENOUS
  Filled 2015-05-13 (×8): qty 200

## 2015-05-13 MED ORDER — POTASSIUM CHLORIDE 10 MEQ/50ML IV SOLN
10.0000 meq | INTRAVENOUS | Status: AC
  Administered 2015-05-13 (×4): 10 meq via INTRAVENOUS
  Filled 2015-05-13 (×4): qty 50

## 2015-05-13 MED ORDER — AMIODARONE LOAD VIA INFUSION
150.0000 mg | Freq: Once | INTRAVENOUS | Status: AC
  Administered 2015-05-13: 150 mg via INTRAVENOUS
  Filled 2015-05-13: qty 83.34

## 2015-05-13 MED ORDER — AMIODARONE HCL IN DEXTROSE 360-4.14 MG/200ML-% IV SOLN
INTRAVENOUS | Status: AC
  Filled 2015-05-13: qty 200

## 2015-05-13 MED ORDER — POTASSIUM CHLORIDE 10 MEQ/100ML IV SOLN: 10.0000 meq | INTRAVENOUS | Status: DC

## 2015-05-13 NOTE — Progress Notes (Signed)
Amiodarone gtt initiated at this time per Dr. Tamala Julian orders.

## 2015-05-13 NOTE — Progress Notes (Signed)
eLink Physician-Brief Progress Note Patient Name: Scott Shelton DOB: 1951/02/21 MRN: [id number]   Date of Service  05/13/2015  HPI/Events of Note  Patient developed Afib this am.  Bp has been stable.  HR has ranged from 100-140.  Patient remains on low dose of levophed.  eICU Interventions  Attempt to wean levophed as this predisposing/aggravating arrythmia.  If does not break will consider amiodarone drip.     Intervention Category Intermediate Interventions: Arrhythmia - evaluation and management  Mauri Brooklyn, P 05/13/2015, 3:53 AM

## 2015-05-13 NOTE — Progress Notes (Signed)
Subjective: Denies abdominal pain Passing a lot of flatus now, feels like he is going to have a bm NG put out a lot yesterday, decreasing now  Objective: Vital signs in last 24 hours: Temp:  [97.5 F (36.4 C)-99.4 F (37.4 C)] 98.9 F (37.2 C) (02/04 1143) Pulse Rate:  [69-151] 69 (02/04 1130) Resp:  [11-32] 14 (02/04 1130) BP: (73-117)/(38-97) 102/57 mmHg (02/04 1130) SpO2:  [89 %-100 %] 89 % (02/04 1130) Weight:  [147.9 kg (326 lb 1 oz)] 147.9 kg (326 lb 1 oz) (02/04 0500) Last BM Date: 05/09/15  Intake/Output from previous day: 02/03 0701 - 02/04 0700 In: 7445 [I.V.:4268.4; NG/GT:1010; IV Piggyback:2166.7] Out: QG:9685244; Emesis/NG output:3000] Intake/Output this shift: Total I/O In: 894.9 [I.V.:744.9; IV Piggyback:150] Out: 1010 [Urine:410; Emesis/NG output:600]  Abdomen obese, soft, non tender  Lab Results:   Recent Labs  05/13/15 0415  WBC 9.3  HGB 9.2*  HCT 36.3*  PLT 195   BMET  Recent Labs  05/12/15 1605 05/13/15 0415  NA 129* 131*  K 3.8 3.3*  CL 90* 93*  CO2 29 29  GLUCOSE 136* 111*  BUN 57* 45*  CREATININE 2.30* 2.01*  CALCIUM 8.9 9.1   PT/INR No results for input(s): LABPROT, INR in the last 72 hours. ABG No results for input(s): PHART, HCO3 in the last 72 hours.  Invalid input(s): PCO2, PO2  Studies/Results: Ct Abdomen Pelvis Wo Contrast  05/12/2015  CLINICAL DATA:  65 year old male with small bowel obstruction on plain radiographs. Initial encounter. EXAM: CT ABDOMEN AND PELVIS WITHOUT CONTRAST TECHNIQUE: Multidetector CT imaging of the abdomen and pelvis was performed following the standard protocol without IV contrast. COMPARISON:  Abdominal films 0453 hours today and earlier. FINDINGS: Consolidation with some air bronchograms in both posterior lower lobes, posterior basal segments. No associated pleural effusion. Elevation of the diaphragm. There is a superimposed small 3 mm lung nodule in the right lower lobe on series 3,  image 11. No pericardial effusion. Enteric tube in place, terminates in the body of the stomach. No acute osseous abnormality identified. Benign vertebral body hemangioma in the right L5 level. Large body habitus. Foley catheter in the urinary bladder which is largely decompressed. Small volume gas within the bladder. No pelvic free fluid. Fluid in the rectum and sigmoid colon. Fluid in the largely decompressed left colon and splenic flexure. Gas distention of the transverse colon and hepatic hepatic flexure. There is fluid in the right colon. There is a trace amount of contrast layering in the cecum. The appendix is normal. The terminal ileum is decompressed. The distal small bowel is decompressed with a gradual transition to dilated air and fluid containing mid small bowel loops. There is occasional oral contrast in the distal decompressed an the mildly to moderately dilated mid small bowel loops. The largest small bowel loops are 5 cm diameter, and proximal loops contain dense oral contrast along with air. The stomach is dilated with oral contrast and some air. The duodenum is mildly dilated. There is a small volume of free fluid about the spleen, about proximal jejunal loops in the left upper quadrant, and layering along the left gutter. No pneumoperitoneum. Numerous fatty gallstones.  No pericholecystic inflammation. Negative noncontrast liver, spleen, pancreas, adrenal glands, and kidneys are within normal limits for age. Aortoiliac calcified atherosclerosis noted. No lymphadenopathy identified. There is nonspecific ventral abdominal wall fat stranding along the left flank and about the umbilicus. There is no abdominal wall hernia identified. IMPRESSION: 1. Dilated stomach and small  bowel loops with a gradual transition to decompressed ileum. Fluid and some gas throughout the colon. Small volume mesenteric and left abdominal free fluid. No free air. 2. Constellation of findings favors inflammatory related  ileus over a mechanical bowel obstruction. 3. Enteric tube in place, terminates in the mid stomach. 4. Bilateral lower lobe consolidation, either pneumonia or severe atelectasis. No associated pleural effusion. 5. Nonspecific ventral and left flank body wall edema or inflammation. Query cellulitis. No abdominal wall hernia identified. 6. Cholelithiasis without CT evidence of acute cholecystitis. 7. Calcified aortic atherosclerosis. Electronically Signed   By: Genevie Ann M.D.   On: 05/12/2015 15:39   Dg Abd 1 View  05/11/2015  CLINICAL DATA:  Evaluate nasogastric tube placement EXAM: ABDOMEN - 1 VIEW COMPARISON:  May 11, 2015 FINDINGS: Nasogastric tube is identified with distal tip in the stomach. There are multiple air-filled dilated small bowel loops. Left central venous line is identified with distal tip in the superior vena cava. IMPRESSION: Nasogastric tube distal tip in the stomach. Small bowel obstruction. Electronically Signed   By: Abelardo Diesel M.D.   On: 05/11/2015 17:43   Dg Abd Portable 1v-small Bowel Obstruction Protocol-initial, 8 Hr Delay  05/12/2015  CLINICAL DATA:  NG tube evaluation. EXAM: PORTABLE ABDOMEN - 1 VIEW COMPARISON:  05/11/2015. FINDINGS: NG tube is noted coiled in stomach. Contrast is noted in the stomach. Persistent severely distended loops of small bowel and possibly large bowel again noted. Mild bowel wall thickening in noted in a right lower quadrant bowel loop. This can be seen with inflammatory or ischemic change. No free air identified . IMPRESSION: 1. NG tube noted coiled stomach. Contrast is noted within the stomach. 2. Persistent severely distended loops of small bowel and possibly large bowel again noted. No interim improvement. Thickening of bowel wall is noted a right lower quadrant bowel loop. This can be seen with inflammatory or ischemic change. No free air identified. Electronically Signed   By: Marcello Moores  Register   On: 05/12/2015 07:15   Dg Abd Portable  1v  05/11/2015  CLINICAL DATA:  65 year old with acute onset of mid and left upper and lower quadrant abdominal pain which began this morning, associated with bilious vomiting. EXAM: PORTABLE ABDOMEN - 1 VIEW COMPARISON:  None. FINDINGS: Gaseous distention of multiple loops of small bowel in the abdomen and upper pelvis. Gas within normal caliber colon. Moderate gaseous distention of the stomach. No suggestion of free air on the supine image. IMPRESSION: Small bowel obstruction. Electronically Signed   By: Evangeline Dakin M.D.   On: 05/11/2015 16:02    Anti-infectives: Anti-infectives    Start     Dose/Rate Route Frequency Ordered Stop   05/09/15 1800  piperacillin-tazobactam (ZOSYN) IVPB 2.25 g  Status:  Discontinued     2.25 g 100 mL/hr over 30 Minutes Intravenous 4 times per day 05/09/15 1056 05/09/15 1317   05/09/15 1400  cefTRIAXone (ROCEPHIN) 2 g in dextrose 5 % 50 mL IVPB     2 g 100 mL/hr over 30 Minutes Intravenous Every 24 hours 05/09/15 1352     05/09/15 1045  piperacillin-tazobactam (ZOSYN) IVPB 3.375 g     3.375 g 100 mL/hr over 30 Minutes Intravenous  Once 05/09/15 1035 05/09/15 1124   05/09/15 1045  vancomycin (VANCOCIN) 2,000 mg in sodium chloride 0.9 % 500 mL IVPB  Status:  Discontinued     2,000 mg 250 mL/hr over 120 Minutes Intravenous  Once 05/09/15 1036 05/09/15 1329  Assessment/Plan:  Ileus  CT scan consistent with ileus more than SBO.  Patient is clinically improving Will clamp the NG  LOS: 4 days    Callan Norden A 05/13/2015

## 2015-05-13 NOTE — Progress Notes (Signed)
Patient ID: Scott Shelton, male   DOB: Aug 29, 1950, 65 y.o.   MRN: [id number]  [name] Progress Note   Assessment/ Plan:   1. Acute renal failure- suspected to be hemodynamically mediated acute renal failure with prerenal azotemia/evolution to ischemic ATN. Urine sediment/serologies point away from acute GN and renal ultrasound does not show any obstruction. He is currently polyuric with ongoing intravenous fluids (net balance positive). 2. Sepsis: Remains on pressor/intravenous fluids for management of sepsis, on ceftriaxone for coverage of infectious source thought to be from his cellulitis/wound infections 3. Lower extremity lichenification/ulcers: Ongoing evaluation and management per recommendations of wound care 4. Hyponatremia: Improving with normal saline drip- suspect will improve further with renal recovery/polyuria 5. Ileus: Suspected by surgery and corroborated by CT scan findings-currently nothing by mouth with NG tube to suction, bowel sounds remained sluggish-will await input from surgery regarding further management. 6. Hypokalemia: Secondary to polyuria/brisk urine output, replaced via IV route. Will give him 4 runs of KCl 78mEq per hour  With improving renal function/status of electrolytes, will sign off at this point and remain available for questions/concerns.  Subjective:   States that he is feeling much better and would like to try something to eat today-remains on pressors.    Objective:   BP 93/55 mmHg  Pulse 108  Temp(Src) 99.4 F (37.4 C) (Oral)  Resp 14  Ht 6' (1.829 m)  Wt 147.9 kg (326 lb 1 oz)  BMI 44.21 kg/m2  SpO2 99%  Intake/Output Summary (Last 24 hours) at 05/13/15 0750 Last data filed at 05/13/15 0500  Gross per 24 hour  Intake 7053.42 ml  Output   5495 ml  Net 1558.42 ml   Weight change: 3.9 kg (8 lb 9.6 oz)  Physical Exam: Gen: Appears to be comfortable resting in bed CVS: Pulse regular in rhythm,  normal rate,S1 and S2  normal Resp: Clear to auscultation bilaterally-no distinct rales or rhonchi Abd: Soft, distended, firm, nontender, scant bowel sounds Ext: Bilateral lower extremities in Ace wrap   Imaging: Ct Abdomen Pelvis Wo Contrast  05/12/2015  CLINICAL DATA:  65 year old male with small bowel obstruction on plain radiographs. Initial encounter. EXAM: CT ABDOMEN AND PELVIS WITHOUT CONTRAST TECHNIQUE: Multidetector CT imaging of the abdomen and pelvis was performed following the standard protocol without IV contrast. COMPARISON:  Abdominal films 0453 hours today and earlier. FINDINGS: Consolidation with some air bronchograms in both posterior lower lobes, posterior basal segments. No associated pleural effusion. Elevation of the diaphragm. There is a superimposed small 3 mm lung nodule in the right lower lobe on series 3, image 11. No pericardial effusion. Enteric tube in place, terminates in the body of the stomach. No acute osseous abnormality identified. Benign vertebral body hemangioma in the right L5 level. Large body habitus. Foley catheter in the urinary bladder which is largely decompressed. Small volume gas within the bladder. No pelvic free fluid. Fluid in the rectum and sigmoid colon. Fluid in the largely decompressed left colon and splenic flexure. Gas distention of the transverse colon and hepatic hepatic flexure. There is fluid in the right colon. There is a trace amount of contrast layering in the cecum. The appendix is normal. The terminal ileum is decompressed. The distal small bowel is decompressed with a gradual transition to dilated air and fluid containing mid small bowel loops. There is occasional oral contrast in the distal decompressed an the mildly to moderately dilated mid small bowel loops. The largest small bowel loops are 5 cm diameter, and  proximal loops contain dense oral contrast along with air. The stomach is dilated with oral contrast and some air. The duodenum is mildly dilated. There is  a small volume of free fluid about the spleen, about proximal jejunal loops in the left upper quadrant, and layering along the left gutter. No pneumoperitoneum. Numerous fatty gallstones.  No pericholecystic inflammation. Negative noncontrast liver, spleen, pancreas, adrenal glands, and kidneys are within normal limits for age. Aortoiliac calcified atherosclerosis noted. No lymphadenopathy identified. There is nonspecific ventral abdominal wall fat stranding along the left flank and about the umbilicus. There is no abdominal wall hernia identified. IMPRESSION: 1. Dilated stomach and small bowel loops with a gradual transition to decompressed ileum. Fluid and some gas throughout the colon. Small volume mesenteric and left abdominal free fluid. No free air. 2. Constellation of findings favors inflammatory related ileus over a mechanical bowel obstruction. 3. Enteric tube in place, terminates in the mid stomach. 4. Bilateral lower lobe consolidation, either pneumonia or severe atelectasis. No associated pleural effusion. 5. Nonspecific ventral and left flank body wall edema or inflammation. Query cellulitis. No abdominal wall hernia identified. 6. Cholelithiasis without CT evidence of acute cholecystitis. 7. Calcified aortic atherosclerosis. Electronically Signed   By: Genevie Ann M.D.   On: 05/12/2015 15:39   Dg Abd 1 View  05/11/2015  CLINICAL DATA:  Evaluate nasogastric tube placement EXAM: ABDOMEN - 1 VIEW COMPARISON:  May 11, 2015 FINDINGS: Nasogastric tube is identified with distal tip in the stomach. There are multiple air-filled dilated small bowel loops. Left central venous line is identified with distal tip in the superior vena cava. IMPRESSION: Nasogastric tube distal tip in the stomach. Small bowel obstruction. Electronically Signed   By: Abelardo Diesel M.D.   On: 05/11/2015 17:43   Dg Abd Portable 1v-small Bowel Obstruction Protocol-initial, 8 Hr Delay  05/12/2015  CLINICAL DATA:  NG tube evaluation.  EXAM: PORTABLE ABDOMEN - 1 VIEW COMPARISON:  05/11/2015. FINDINGS: NG tube is noted coiled in stomach. Contrast is noted in the stomach. Persistent severely distended loops of small bowel and possibly large bowel again noted. Mild bowel wall thickening in noted in a right lower quadrant bowel loop. This can be seen with inflammatory or ischemic change. No free air identified . IMPRESSION: 1. NG tube noted coiled stomach. Contrast is noted within the stomach. 2. Persistent severely distended loops of small bowel and possibly large bowel again noted. No interim improvement. Thickening of bowel wall is noted a right lower quadrant bowel loop. This can be seen with inflammatory or ischemic change. No free air identified. Electronically Signed   By: Marcello Moores  Register   On: 05/12/2015 07:15   Dg Abd Portable 1v  05/11/2015  CLINICAL DATA:  65 year old with acute onset of mid and left upper and lower quadrant abdominal pain which began this morning, associated with bilious vomiting. EXAM: PORTABLE ABDOMEN - 1 VIEW COMPARISON:  None. FINDINGS: Gaseous distention of multiple loops of small bowel in the abdomen and upper pelvis. Gas within normal caliber colon. Moderate gaseous distention of the stomach. No suggestion of free air on the supine image. IMPRESSION: Small bowel obstruction. Electronically Signed   By: Evangeline Dakin M.D.   On: 05/11/2015 16:02    Labs: BMET  Recent Labs Lab 05/10/15 0222  05/10/15 1810 05/11/15 0445 05/11/15 0820 05/11/15 1715 05/12/15 0152 05/12/15 1605 05/13/15 0415  NA 117*  < > 118* 119* 121* 123* 126* 129* 131*  K 5.0  < > 4.9 4.3 4.7  4.4 4.7 3.8 3.3*  CL 87*  < > 85* 80* 80* 81* 84* 90* 93*  CO2 14*  < > 22 27 29 28 28 29 29   GLUCOSE 169*  < > 150* 126* 164* 122* 136* 136* 111*  BUN 135*  < > 124* 108* 100* 91* 82* 57* 45*  CREATININE 4.41*  < > 3.63* 3.07* 2.91* 2.87* 2.70* 2.30* 2.01*  CALCIUM 8.5*  < > 8.2* 8.5* 8.6* 8.7* 9.0 8.9 9.1  PHOS 5.3*  --   --   --    --   --   --   --   --   < > = values in this interval not displayed. CBC  Recent Labs Lab 05/09/15 1000 05/09/15 1038 05/10/15 0222 05/13/15 0415  WBC 11.5*  --  30.7* 9.3  HGB 12.6* 13.9 13.0 9.2*  HCT 35.9* 41.0 36.5* 36.3*  MCV 87.6  --  88.6 125.2*  PLT 306  --  303 195    Medications:    . cefTRIAXone (ROCEPHIN)  IV  2 g Intravenous Q24H  . Chlorhexidine Gluconate Cloth  6 each Topical Q0600  . docusate sodium  100 mg Oral Daily  . heparin  5,000 Units Subcutaneous 3 times per day  . hydrocerin   Topical Daily  . mupirocin ointment  1 application Nasal BID  . sodium chloride flush  10-40 mL Intracatheter Q12H   Elmarie Shiley, MD 05/13/2015, 7:50 AM

## 2015-05-13 NOTE — Progress Notes (Signed)
Patient into atrial fib from sinus rhythm-sinus tach at this time. CCM secretary reports Dr. Tamala Julian will call back regarding change.

## 2015-05-13 NOTE — Progress Notes (Signed)
Dr. Tamala Julian contacted RN regarding rhythm change, reported to RN that he will continue to monitor patients rhythm and start Amiodarone gtt necessary. Will continue to monitor.

## 2015-05-13 NOTE — Progress Notes (Signed)
PULMONARY / CRITICAL CARE MEDICINE   Name: Scott Shelton MRN: [id number] DOB: 01/22/1951    ADMISSION DATE:  05/09/2015 CONSULTATION DATE:  05/09/2015  REFERRING MD:  Dr. Claudine Mouton EDP  CHIEF COMPLAINT:  syncope  HISTORY OF PRESENT ILLNESS:   65 year old male with no significant past medical history. He has neglected his own medical care as he is the primary caregiver for his father who has advanced dementia. His health for the most part has been "pretty good" with the exception of a cataract surgery. About one year ago he developed small red punctate lesions to bilateral lower extremities from the calves down. These lesions began to spread and overwhelmed his the entirety of this area. He managed this himself without medical attention, but it seems like he has not been doing much for them. Over the past few weeks he has complained on increased DOE with simple tasks that would have been easy for him before. Denies cough, chest pain, and SOB at rest. 1/31 he was standing next to the couch suffered a syncopal episode during which he fell back onto the couch into a seated position. He awoke spontaneously about 3-4 minutes later. He presented to ED with this complaint. In the emergency department he was found to be hypotensive 94/80 and hypothermic 96.36F. Laboratory evaluation significant for Creatinine 5.82, K 7.3, Na 109, Cl 79. Also had some mild leukocytosis with WBC 11.5. Seen by nephrology in ED, PCCM to admit.  SUBJECTIVE:  Says he is feeling much better , nausea resolved Talking on phone.  Weaning pressors today .  Passing gas w/ no abd pain. -surgeon following with CT abd c/w ileus vs SBO  Afib overnight , converted to NSR this am .   VITAL SIGNS: BP 105/53 mmHg  Pulse 80  Temp(Src) 98.9 F (37.2 C) (Oral)  Resp 14  Ht 6' (1.829 m)  Wt 326 lb 1 oz (147.9 kg)  BMI 44.21 kg/m2  SpO2 97%  HEMODYNAMICS: CVP:  [6 mmHg-12 mmHg] 12 mmHg  VENTILATOR SETTINGS:    INTAKE / OUTPUT: I/O last  3 completed shifts: In: [id number] [I.V.:6835.3; NG/GT:1190; IV Piggyback:2166.7] Out: 9220 [Urine:5170; Emesis/NG output:4050]  PHYSICAL EXAMINATION: General:  Obese male in NAD  Neuro:  Alert, oriented, non-focal HEENT:  [place]/AT, PERRL, no JVD Cardiovascular:  RRR, no MRG Lungs:  Clear bilateral breath sounds, mildly increased WOB Abdomen:  Soft, slightly distended. No pain. Rare bowel sounds Musculoskeletal:  No acute deformity Skin:  LLE edematous and dry. RLE edematous, erythema, areas of ulceration. Leg wraps in place   LABS:  BMET  Recent Labs Lab 05/12/15 0152 05/12/15 1605 05/13/15 0415  NA 126* 129* 131*  K 4.7 3.8 3.3*  CL 84* 90* 93*  CO2 28 29 29   BUN 82* 57* 45*  CREATININE 2.70* 2.30* 2.01*  GLUCOSE 136* 136* 111*    Electrolytes  Recent Labs Lab 05/10/15 0222  05/12/15 0152 05/12/15 1605 05/13/15 0415  CALCIUM 8.5*  < > 9.0 8.9 9.1  MG 5.1*  --   --   --   --   PHOS 5.3*  --   --   --   --   < > = values in this interval not displayed.  CBC  Recent Labs Lab 05/09/15 1000 05/09/15 1038 05/10/15 0222 05/13/15 0415  WBC 11.5*  --  30.7* 9.3  HGB 12.6* 13.9 13.0 9.2*  HCT 35.9* 41.0 36.5* 36.3*  PLT 306  --  303 195    Coag's  No results for input(s): APTT, INR in the last 168 hours.  Sepsis Markers  Recent Labs Lab 05/09/15 1450 05/09/15 1502 05/09/15 1703 05/10/15 0222 05/11/15 0445  LATICACIDVEN 2.5* 2.49* 1.6  --   --   PROCALCITON 0.19  --   --  6.15 13.77    ABG No results for input(s): PHART, PCO2ART, PO2ART in the last 168 hours.  Liver Enzymes No results for input(s): AST, ALT, ALKPHOS, BILITOT, ALBUMIN in the last 168 hours.  Cardiac Enzymes  Recent Labs Lab 05/09/15 1145 05/09/15 1450 05/09/15 2052  TROPONINI 0.07* 0.08* 0.07*    Glucose  Recent Labs Lab 05/09/15 1001 05/09/15 1239 05/11/15 0756 05/11/15 1221 05/11/15 1917  GLUCAP 122* 116* 145* 123* 104*    Imaging Ct Abdomen Pelvis Wo  Contrast  05/12/2015  CLINICAL DATA:  65 year old male with small bowel obstruction on plain radiographs. Initial encounter. EXAM: CT ABDOMEN AND PELVIS WITHOUT CONTRAST TECHNIQUE: Multidetector CT imaging of the abdomen and pelvis was performed following the standard protocol without IV contrast. COMPARISON:  Abdominal films 0453 hours today and earlier. FINDINGS: Consolidation with some air bronchograms in both posterior lower lobes, posterior basal segments. No associated pleural effusion. Elevation of the diaphragm. There is a superimposed small 3 mm lung nodule in the right lower lobe on series 3, image 11. No pericardial effusion. Enteric tube in place, terminates in the body of the stomach. No acute osseous abnormality identified. Benign vertebral body hemangioma in the right L5 level. Large body habitus. Foley catheter in the urinary bladder which is largely decompressed. Small volume gas within the bladder. No pelvic free fluid. Fluid in the rectum and sigmoid colon. Fluid in the largely decompressed left colon and splenic flexure. Gas distention of the transverse colon and hepatic hepatic flexure. There is fluid in the right colon. There is a trace amount of contrast layering in the cecum. The appendix is normal. The terminal ileum is decompressed. The distal small bowel is decompressed with a gradual transition to dilated air and fluid containing mid small bowel loops. There is occasional oral contrast in the distal decompressed an the mildly to moderately dilated mid small bowel loops. The largest small bowel loops are 5 cm diameter, and proximal loops contain dense oral contrast along with air. The stomach is dilated with oral contrast and some air. The duodenum is mildly dilated. There is a small volume of free fluid about the spleen, about proximal jejunal loops in the left upper quadrant, and layering along the left gutter. No pneumoperitoneum. Numerous fatty gallstones.  No pericholecystic  inflammation. Negative noncontrast liver, spleen, pancreas, adrenal glands, and kidneys are within normal limits for age. Aortoiliac calcified atherosclerosis noted. No lymphadenopathy identified. There is nonspecific ventral abdominal wall fat stranding along the left flank and about the umbilicus. There is no abdominal wall hernia identified. IMPRESSION: 1. Dilated stomach and small bowel loops with a gradual transition to decompressed ileum. Fluid and some gas throughout the colon. Small volume mesenteric and left abdominal free fluid. No free air. 2. Constellation of findings favors inflammatory related ileus over a mechanical bowel obstruction. 3. Enteric tube in place, terminates in the mid stomach. 4. Bilateral lower lobe consolidation, either pneumonia or severe atelectasis. No associated pleural effusion. 5. Nonspecific ventral and left flank body wall edema or inflammation. Query cellulitis. No abdominal wall hernia identified. 6. Cholelithiasis without CT evidence of acute cholecystitis. 7. Calcified aortic atherosclerosis. Electronically Signed   By: Genevie Ann M.D.   On: 05/12/2015  15:39     STUDIES:  Renal US 1/31 >>> normal TTE 2/1 >> moderate LVH, normal EF 65-70%, normal wall-motion, normal LA, normal RV size and fxn   CULTURES: Blood 1/31 >>> Urine 1/31 >>> multiple morphologies  ANTIBIOTICS: Zosyn/Vanc in ED 1/31  Rocephin 1/31 >>>  SIGNIFICANT EVENTS: 1/31 admit  LINES/TUBES: L IJ CVC 05/10/15 >>   DISCUSSION: 65 year old male with no PMH. 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 1 year history of BLE edema and wounds. Suffered syncopal episode 1/31 and found to be hypotensive with acute renal failure, hypernatremia, hyperkalemia. Adm[facility] to [facility] for medical management, nephrology seeing and helping with metabolic derangements.   ASSESSMENT / PLAN:  PULMONARY A: No acute issues  P:   Supplemental O2 as indicated to  keep SpO2 > 92% Incentive spirometry  CARDIOVASCULAR A:  Shock, presumed septic + hypovolemic Atrial Fib -amio started 2/4   P:  Telemetry Decrease IVF  MAP goal > 53mmHg, wean norepi as able  Cont Amio   RENAL A:   Acute renal failure>scr improving with good UOP  Hyperkalemia (given insulin, calcium, lasix, and kayexalate in ED) Hyponatremia chronic, hypovolemic-improving  NAG acidosis Hypokalemia   P:   Nephrology following, appreciate assistance.  Appears to be in post-ATN diuresis phase of his renal injury  Stopped bicarb gtt on 2/2. Check bmet in am  Correct potassium Cont IVF , may need to decrease as able to wean off pressors.    GASTROINTESTINAL A:   Ileus >CT abd 2/3 c/w more with ileus than SBO   P:   NGT in place, clamped per GS  Appreciate CCS consult  Protonix for SUP Renal diet on hold for now  HEMATOLOGIC A:   Anemia in setting renal failure  P:  Follow CBC SQ heparin for VTE ppx  INFECTIOUS A:   Severe sepsis secondary to RLE cellulitis  P:   Cultures and ABX as above (rocephin) If pressor needs continue then consider possible cx-negative MRSA, ? Change to vanco. Avoiding in setting renal failure Appreciate wound care consult Consider LE doppler studies   ENDOCRINE A:   No acute issues  P:   Follow glucose on BMP  NEUROLOGIC A:   No acute issues  P:   RASS goal: 0 Monitor  GLOBAL: Patient has father of whom he is primary caregiver. Father is here in hospital and there is no one to keep and eye on him. Social work finding alternative care for him while pt is ill  FAMILY  - Updates: patient updated daily by RB  - Inter-disciplinary family meet or Palliative Care meeting due by:  2/6  Tammy Parrett NP-C  [facility]  [phone]   Attending Note:  65 year old male presenting with cellulitis of the leg and septic shock.  Continues to require pressors to maintain his SBP.  Presented with acute renal  failure as well that is steadily improving.  UOP remains good and patient is actually requiring fluid back at this point.  Correct potassium and maintain fluid even at this point.  On exam, patient's lungs are clear.  I reviewed CT of the abdomen and pelvis myself, dilated stomach with bilateral lower lobes infiltrate noted.  Will maintain in the ICU overnight since pressors are needed.  Will maintain fluid even.  Will re-evaluate in AM.  The patient is critically ill with multiple organ systems failure and requires high  complexity decision making for assessment and support, frequent evaluation and titration of therapies, application of advanced monitoring technologies and extensive interpretation of multiple databases.   Critical Care Time devoted to patient care services described in this note is  35  Minutes. This time reflects time of care of this signee Dr Jennet Maduro. This critical care time does not reflect procedure time, or teaching time or supervisory time of PA/NP/Med student/Med Resident etc but could involve care discussion time.  Rush Farmer, M.D. [facility]monary/Critical Care Medicine. Pager: [phone]. After hours pager: [phone].

## 2015-05-14 LAB — BASIC METABOLIC PANEL
ANION GAP: 13 (ref 5–15)
Anion gap: 9 (ref 5–15)
BUN: 34 mg/dL — AB (ref 6–20)
BUN: 28 mg/dL — ABNORMAL HIGH (ref 6–20)
CALCIUM: 9.1 mg/dL (ref 8.9–10.3)
CO2: 29 mmol/L (ref 22–32)
CO2: 27 mmol/L (ref 22–32)
Calcium: 9 mg/dL (ref 8.9–10.3)
Chloride: 97 mmol/L — ABNORMAL LOW (ref 101–111)
Chloride: 95 mmol/L — ABNORMAL LOW (ref 101–111)
Creatinine, Ser: 1.86 mg/dL — ABNORMAL HIGH (ref 0.61–1.24)
Creatinine, Ser: 1.76 mg/dL — ABNORMAL HIGH (ref 0.61–1.24)
GFR, EST AFRICAN AMERICAN: 43 mL/min — AB (ref >60)
GFR, EST AFRICAN AMERICAN: 46 mL/min — AB (ref >60)
GFR, EST NON AFRICAN AMERICAN: 37 mL/min — AB (ref >60)
GFR, EST NON AFRICAN AMERICAN: 40 mL/min — AB (ref >60)
Glucose, Bld: 95 mg/dL (ref 65–99)
Glucose, Bld: 94 mg/dL (ref 65–99)
POTASSIUM: 3.8 mmol/L (ref 3.5–5.1)
Potassium: 3.6 mmol/L (ref 3.5–5.1)
SODIUM: 135 mmol/L (ref 135–145)
Sodium: 135 mmol/L (ref 135–145)

## 2015-05-14 LAB — CBC
HEMATOCRIT: 26.5 % — AB (ref 39.0–52.0)
Hemoglobin: 8.6 g/dL — ABNORMAL LOW (ref 13.0–17.0)
MCH: 31.3 pg (ref 26.0–34.0)
MCHC: 32.5 g/dL (ref 30.0–36.0)
MCV: 96.4 fL (ref 78.0–100.0)
PLATELETS: 214 10*3/uL (ref 150–400)
RBC: 2.75 MIL/uL — ABNORMAL LOW (ref 4.22–5.81)
RDW: 13.6 % (ref 11.5–15.5)
WBC: 7.1 10*3/uL (ref 4.0–10.5)

## 2015-05-14 LAB — CULTURE, BLOOD (ROUTINE X 2)
Culture: NO GROWTH
Culture: NO GROWTH

## 2015-05-14 MED ORDER — CHLORHEXIDINE GLUCONATE 0.12 % MT SOLN
15.0000 mL | Freq: Two times a day (BID) | OROMUCOSAL | Status: DC
  Administered 2015-05-14 – 2015-05-16 (×4): 15 mL via OROMUCOSAL
  Filled 2015-05-14 (×5): qty 15

## 2015-05-14 MED ORDER — CETYLPYRIDINIUM CHLORIDE 0.05 % MT LIQD
7.0000 mL | Freq: Two times a day (BID) | OROMUCOSAL | Status: DC
  Administered 2015-05-14 – 2015-05-16 (×4): 7 mL via OROMUCOSAL

## 2015-05-14 NOTE — Progress Notes (Signed)
PULMONARY / CRITICAL CARE MEDICINE   Name: Scott Shelton MRN: [id number] DOB: 1950/12/25    ADMISSION DATE:  05/09/2015 CONSULTATION DATE:  05/09/2015  REFERRING MD:  Dr. Claudine Mouton EDP  CHIEF COMPLAINT:  syncope  HISTORY OF PRESENT ILLNESS:   65 year old male with no significant past medical history. He has neglected his own medical care as he is the primary caregiver for his father who has advanced dementia. His health for the most part has been "pretty good" with the exception of a cataract surgery. About one year ago he developed small red punctate lesions to bilateral lower extremities from the calves down. These lesions began to spread and overwhelmed his the entirety of this area. He managed this himself without medical attention, but it seems like he has not been doing much for them. Over the past few weeks he has complained on increased DOE with simple tasks that would have been easy for him before. Denies cough, chest pain, and SOB at rest. 1/31 he was standing next to the couch suffered a syncopal episode during which he fell back onto the couch into a seated position. He awoke spontaneously about 3-4 minutes later. He presented to ED with this complaint. In the emergency department he was found to be hypotensive 94/80 and hypothermic 96.32F. Laboratory evaluation significant for Creatinine 5.82, K 7.3, Na 109, Cl 79. Also had some mild leukocytosis with WBC 11.5. Seen by nephrology in ED, PCCM to admit.  SUBJECTIVE:  Unable to keep NGT clamped with large output and nausea, placed back on intermittent sxn.  Weaned off pressors last pm  VITAL SIGNS: BP 98/50 mmHg  Pulse 74  Temp(Src) 98.7 F (37.1 C) (Oral)  Resp 15  Ht 6' (1.829 m)  Wt 321 lb 14 oz (146 kg)  BMI 43.64 kg/m2  SpO2 99%  HEMODYNAMICS: CVP:  [7 mmHg-8 mmHg] 8 mmHg  VENTILATOR SETTINGS:    INTAKE / OUTPUT: I/O last 3 completed shifts: In: 6374.9 [P.O.:180; I.V.:5944.9; IV Piggyback:250] Out: 6461 [Urine:2360;  Emesis/NG output:4100; Stool:1]  PHYSICAL EXAMINATION: General:  Obese male in NAD  Neuro:  Alert, oriented, non-focal HEENT:  [place]/AT, PERRL, no JVD Cardiovascular:  RRR, no MRG Lungs:  Clear bilateral breath sounds, mildly increased WOB Abdomen:  Soft, slightly distended. No pain. Rare bowel sounds Musculoskeletal:  No acute deformity Skin:  LLE edematous and dry. RLE edematous, erythema, areas of ulceration. Leg wraps in place   LABS:  BMET  Recent Labs Lab 05/13/15 0415 05/13/15 1600 05/14/15 0215  NA 131* 134* 135  K 3.3* 3.7 3.8  CL 93* 94* 97*  CO2 29 29 29   BUN 45* 39* 34*  CREATININE 2.01* 1.98* 1.86*  GLUCOSE 111* 101* 95    Electrolytes  Recent Labs Lab 05/10/15 0222  05/13/15 0415 05/13/15 1600 05/14/15 0215  CALCIUM 8.5*  < > 9.1 9.5 9.0  MG 5.1*  --   --   --   --   PHOS 5.3*  --   --   --   --   < > = values in this interval not displayed.  CBC  Recent Labs Lab 05/10/15 0222 05/13/15 0415 05/14/15 0215  WBC 30.7* 9.3 7.1  HGB 13.0 9.2* 8.6*  HCT 36.5* 36.3* 26.5*  PLT 303 195 214    Coag's No results for input(s): APTT, INR in the last 168 hours.  Sepsis Markers  Recent Labs Lab 05/09/15 1450 05/09/15 1502 05/09/15 1703 05/10/15 0222 05/11/15 0445  LATICACIDVEN 2.5* 2.49*  1.6  --   --   PROCALCITON 0.19  --   --  6.15 13.77    ABG No results for input(s): PHART, PCO2ART, PO2ART in the last 168 hours.  Liver Enzymes No results for input(s): AST, ALT, ALKPHOS, BILITOT, ALBUMIN in the last 168 hours.  Cardiac Enzymes  Recent Labs Lab 05/09/15 1145 05/09/15 1450 05/09/15 2052  TROPONINI 0.07* 0.08* 0.07*    Glucose  Recent Labs Lab 05/09/15 1001 05/09/15 1239 05/11/15 0756 05/11/15 1221 05/11/15 1917  GLUCAP 122* 116* 145* 123* 104*    Imaging No results found.   STUDIES:  Renal US 1/31 >>> normal TTE 2/1 >> moderate LVH, normal EF 65-70%, normal wall-motion, normal LA, normal RV size and fxn CT abd  /pelv 2/3 >dilated stomach/SB loops, fluid and some gas in colon, no free air , looks more like ileus vs obstruction , bilateral lung base consolidation    CULTURES: Blood 1/31 >>> Urine 1/31 >>> multiple morphologies  ANTIBIOTICS: Zosyn/Vanc in ED 1/31  Rocephin 1/31 >>>  SIGNIFICANT EVENTS: 1/31 admit  LINES/TUBES: L IJ CVC 05/10/15 >>   DISCUSSION: 65 year old male with no PMH. 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 1 year history of BLE edema and wounds. Suffered syncopal episode 1/31 and found to be hypotensive with acute renal failure, hypernatremia, hyperkalemia. Admited to [facility] for medical management, nephrology seeing and helping with metabolic derangements.   ASSESSMENT / PLAN:  PULMONARY A: No acute issues  P:   Supplemental O2 as indicated to keep SpO2 > 92% Incentive spirometry  CARDIOVASCULAR A:  Shock, presumed septic + hypovolemic>improved , weaned off pressors 2/4  Atrial Fib -amio started 2/4   P:  Telemetry Decrease IVF to 75cc (Na /scr improved )  Cont Amio  Attempt to get off pressors today with fluid bolus.  RENAL A:   Acute renal failure>scr improving with good UOP  Hyperkalemia (given insulin, calcium, lasix, and kayexalate in ED)>resolved  Hyponatremia chronic, hypovolemic-resolved  NAG acidosis>resolved  Hypokalemia >resolved   P:   Nephrology following, appreciate assistance.  Appears to be in post-ATN diuresis phase of his renal injury  Stopped bicarb gtt on 2/2. Check bmet in am  Decrease IVF to 75cc    GASTROINTESTINAL A:   Ileus >CT abd 2/3 c/w more with ileus than SBO   P:   NGT in place  Appreciate CCS consult  Protonix for SUP Renal diet on hold for now  HEMATOLOGIC A:   Anemia in setting renal failure  P:  Follow CBC SQ heparin for VTE ppx  INFECTIOUS A:   Severe sepsis secondary to RLE cellulitis  P:   Cultures and ABX as above (rocephin) Appreciate  wound care consult Consider LE doppler studies   ENDOCRINE A:   No acute issues  P:   Follow glucose on BMP  NEUROLOGIC A:   No acute issues  P:   RASS goal: n/a  Monitor  GLOBAL: Patient has father of whom he is primary caregiver. Father is here in hospital and there is no one to keep and eye on him. Social work finding alternative care for him while pt is ill  FAMILY  - Updates: patient updated daily by RB  - Inter-disciplinary family meet or Palliative Care meeting due by:  2/6  Tammy Parrett NP-C  [facility]  [phone]   Attending Note:  65 year old male on pressors due  to septic shock from leg cellulitis.  The patient remains hypotensive and requiring pressors however dose is decreasing.  Will give additional dose of IVF and see if we can get him off pressors today.  If able then will transfer to SDU later on today.  On exam lungs are clear to auscultation, leg cellulitis still appears very poorly.  Surgery involved.  Continue diet.  Will revisit in the afternoon.  The patient is critically ill with multiple organ systems failure and requires high complexity decision making for assessment and support, frequent evaluation and titration of therapies, application of advanced monitoring technologies and extensive interpretation of multiple databases.   Critical Care Time devoted to patient care services described in this note is  35  Minutes. This time reflects time of care of this signee Dr Jennet Maduro. This critical care time does not reflect procedure time, or teaching time or supervisory time of PA/NP/Med student/Med Resident etc but could involve care discussion time.  Rush Farmer, M.D. [facility] Pulmonary/Critical Care Medicine. Pager: [phone]. After hours pager: [phone].

## 2015-05-14 NOTE — Progress Notes (Signed)
Patient ID: Scott Shelton, male   DOB: 10-11-1950, 65 y.o.   MRN: [id number]  [facility]  Subjective: Patient sleeping, difficult to arouse.  Per nurse, large NG output - back on suction due to nausea.  Small liquid BM this AM.  Objective: Vital signs in last 24 hours: Temp:  [98.7 F (37.1 C)-99.1 F (37.3 C)] 98.7 F (37.1 C) (02/05 0700) Pulse Rate:  [69-91] 74 (02/05 0700) Resp:  [12-25] 15 (02/05 0700) BP: (81-120)/(46-73) 98/50 mmHg (02/05 0700) SpO2:  [89 %-100 %] 99 % (02/05 0700) Weight:  [146 kg (321 lb 14 oz)] 146 kg (321 lb 14 oz) (02/05 0300) Last BM Date: 05/13/15  Intake/Output from previous day: 02/04 0701 - 02/05 0700 In: 4069.2 [P.O.:180; I.V.:3639.2; IV Piggyback:250] Out: [id number] [Urine:1150; Emesis/NG output:2100; Stool:1] Intake/Output this shift: Total I/O In: 30 [NG/GT:30] Out: 550 [Urine:150; Emesis/NG output:400]  Physical Exam: HEENT - sclerae clear, mucous membranes moist Neck - soft Abdomen - soft, obese, non-distended; no tenderness; quiet to auscultation  Lab Results:   Recent Labs  05/13/15 0415 05/14/15 0215  WBC 9.3 7.1  HGB 9.2* 8.6*  HCT 36.3* 26.5*  PLT 195 214   BMET  Recent Labs  05/13/15 1600 05/14/15 0215  NA 134* 135  K 3.7 3.8  CL 94* 97*  CO2 29 29  GLUCOSE 101* 95  BUN 39* 34*  CREATININE 1.98* 1.86*  CALCIUM 9.5 9.0   PT/INR No results for input(s): LABPROT, INR in the last 72 hours. Comprehensive Metabolic Panel:    Component Value Date/Time   NA 135 05/14/2015 0215   NA 134* 05/13/2015 1600   K 3.8 05/14/2015 0215   K 3.7 05/13/2015 1600   CL 97* 05/14/2015 0215   CL 94* 05/13/2015 1600   CO2 29 05/14/2015 0215   CO2 29 05/13/2015 1600   BUN 34* 05/14/2015 0215   BUN 39* 05/13/2015 1600   CREATININE 1.86* 05/14/2015 0215   CREATININE 1.98* 05/13/2015 1600   GLUCOSE 95 05/14/2015 0215   GLUCOSE 101* 05/13/2015 1600   CALCIUM 9.0 05/14/2015 0215   CALCIUM 9.5  05/13/2015 1600    Studies/Results: Ct Abdomen Pelvis Wo Contrast  05/12/2015  CLINICAL DATA:  65 year old male with small bowel obstruction on plain radiographs. Initial encounter. EXAM: CT ABDOMEN AND PELVIS WITHOUT CONTRAST TECHNIQUE: Multidetector CT imaging of the abdomen and pelvis was performed following the standard protocol without IV contrast. COMPARISON:  Abdominal films 0453 hours today and earlier. FINDINGS: Consolidation with some air bronchograms in both posterior lower lobes, posterior basal segments. No associated pleural effusion. Elevation of the diaphragm. There is a superimposed small 3 mm lung nodule in the right lower lobe on series 3, image 11. No pericardial effusion. Enteric tube in place, terminates in the body of the stomach. No acute osseous abnormality identified. Benign vertebral body hemangioma in the right L5 level. Large body habitus. Foley catheter in the urinary bladder which is largely decompressed. Small volume gas within the bladder. No pelvic free fluid. Fluid in the rectum and sigmoid colon. Fluid in the largely decompressed left colon and splenic flexure. Gas distention of the transverse colon and hepatic hepatic flexure. There is fluid in the right colon. There is a trace amount of contrast layering in the cecum. The appendix is normal. The terminal ileum is decompressed. The distal small bowel is decompressed with a gradual transition to dilated air and fluid containing mid small bowel loops. There is occasional oral contrast  in the distal decompressed an the mildly to moderately dilated mid small bowel loops. The largest small bowel loops are 5 cm diameter, and proximal loops contain dense oral contrast along with air. The stomach is dilated with oral contrast and some air. The duodenum is mildly dilated. There is a small volume of free fluid about the spleen, about proximal jejunal loops in the left upper quadrant, and layering along the left gutter. No  pneumoperitoneum. Numerous fatty gallstones.  No pericholecystic inflammation. Negative noncontrast liver, spleen, pancreas, adrenal glands, and kidneys are within normal limits for age. Aortoiliac calcified atherosclerosis noted. No lymphadenopathy identified. There is nonspecific ventral abdominal wall fat stranding along the left flank and about the umbilicus. There is no abdominal wall hernia identified. IMPRESSION: 1. Dilated stomach and small bowel loops with a gradual transition to decompressed ileum. Fluid and some gas throughout the colon. Small volume mesenteric and left abdominal free fluid. No free air. 2. Constellation of findings favors inflammatory related ileus over a mechanical bowel obstruction. 3. Enteric tube in place, terminates in the mid stomach. 4. Bilateral lower lobe consolidation, either pneumonia or severe atelectasis. No associated pleural effusion. 5. Nonspecific ventral and left flank body wall edema or inflammation. Query cellulitis. No abdominal wall hernia identified. 6. Cholelithiasis without CT evidence of acute cholecystitis. 7. Calcified aortic atherosclerosis. Electronically Signed   By: Genevie Ann M.D.   On: 05/12/2015 15:39    Assessment & Plans: Ileus  Continue NG decompression for now  Will check AXR in AM 2/6  Earnstine Regal, MD, [facility] Office: [place] 05/14/2015

## 2015-05-14 NOTE — Plan of Care (Signed)
Problem: Safety: Goal: Ability to remain free from injury will improve Outcome: Not Progressing Patient refuses turning and repositioning at times and also to elevate his swollen legs.   Problem: Pain Managment: Goal: General experience of comfort will improve Outcome: Progressing Patient reported mild abdominal pain.   Problem: Bowel/Gastric: Goal: Will not experience complications related to bowel motility NG tube unclamped and hooked back to low intermittent suction since patient was complaining of nausea. Nausea went away with suction.

## 2015-05-15 LAB — MAGNESIUM: MAGNESIUM: 2.4 mg/dL (ref 1.7–2.4)

## 2015-05-15 LAB — CBC
HCT: 36.3 % — ABNORMAL LOW (ref 39.0–52.0)
HEMOGLOBIN: 9.2 g/dL — AB (ref 13.0–17.0)
MCH: 31.7 pg (ref 26.0–34.0)
MCHC: 31.7 g/dL (ref 30.0–36.0)
MCV: 95.3 fL (ref 78.0–100.0)
Platelets: 195 10*3/uL (ref 150–400)
RBC: 2.9 MIL/uL — ABNORMAL LOW (ref 4.22–5.81)
RDW: 15 % (ref 11.5–15.5)
WBC: 9.3 10*3/uL (ref 4.0–10.5)

## 2015-05-15 LAB — RENAL FUNCTION PANEL
ALBUMIN: 2.3 g/dL — AB (ref 3.5–5.0)
Anion gap: 11 (ref 5–15)
BUN: 26 mg/dL — AB (ref 6–20)
CALCIUM: 9.1 mg/dL (ref 8.9–10.3)
CO2: 28 mmol/L (ref 22–32)
CREATININE: 1.71 mg/dL — AB (ref 0.61–1.24)
Chloride: 99 mmol/L — ABNORMAL LOW (ref 101–111)
GFR, EST AFRICAN AMERICAN: 48 mL/min — AB (ref >60)
GFR, EST NON AFRICAN AMERICAN: 41 mL/min — AB (ref >60)
Glucose, Bld: 88 mg/dL (ref 65–99)
PHOSPHORUS: 2.7 mg/dL (ref 2.5–4.6)
Potassium: 3.5 mmol/L (ref 3.5–5.1)
SODIUM: 138 mmol/L (ref 135–145)

## 2015-05-15 LAB — D-DIMER, QUANTITATIVE (NOT AT ARMC): D DIMER QUANT: 3.82 ug{FEU}/mL — AB (ref 0.00–0.50)

## 2015-05-15 NOTE — Progress Notes (Signed)
[facility] TEAM 1 - Stepdown/ICU TEAM PROGRESS NOTE  Jentezen Ard [id number] DOB: 15-Jan-1951 DOA: 05/09/2015 PCP: No primary care provider on file.  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 About one year prior he developed small red punctate lesions to bilateral lower extremities from the calves down. These lesions began to spread and overwhelmed the entirety of the legs. Over a few weeks prior to his admit he developed DOE with simple tasks that would have been easy for him before.  1/31 he suffered a syncopal episode during which he fell back onto a couch into a seated position. He awoke spontaneously about 3-4 minutes later. He presented to ED where he was found to be hypotensive 94/80 and hypothermic 96.61F. Laboratory evaluation was significant for Creatinine 5.82, K 7.3, Na 109, Cl 79. Also had some mild leukocytosis with WBC 11.5.   HPI/Subjective: The pt is resting comfortably in a bedside chair having his legs dressed.  He denies cp, sob, n/v, or abdom pain. He has tolerated clear liquids this morning.  He is in good spirits and quite pleasant.    Assessment/Plan:  Shock, presumed septic + hypovolemic Resolved - weaned off pressors 2/4   Severe sepsis secondary to RLE cellulitis Continues to improve - cont current abx - follow w/ ongoing wound care - given B nature, I doubt DVT as an etiology and suspect extent of edema would make venous duplex nearly useless - I will check a d-dimer - cont current dressings - begin to ambulate  Newly diagnosed Atrial Fib amio started 2/4 - appears to be back in NSR this morning - cont amio IV for now - transition to oral when regular diet resumed - suspect this was brought about by ARF and electrolyte abnormalities in the face of sepsis w/ pressors - follow on tele - may not yet require long term amio or anticoag   Acute renal  failure cr improving with good UOP in post-ATN diuresis phase of his renal injury - follow   Hyperkalemia resolved   Hyponatremia Hypovolemic - resolved   Ileus CT abd 2/3 c/w ileus  - NGT in place - Gen Surgery following - to clamp NG today   Anemia in of setting renal failure Hgb stable - avoid IV Fe load in setting of probable acute infection   MRSA screen +  Code Status: FULL Family Communication: no family present at time of exam Disposition Plan: SDU  Consultants: Nephrology  PCCM Gen Surg  Antibiotics: Zosyn/Vanc 1/31  Rocephin 1/31 >  Procedures: TTE 2/1 > moderate LVH, EF 65-70%, normal wall-motion, normal LA, normal RV size and fxn  DVT prophylaxis: SQ heparin   Objective: Blood pressure 97/59, pulse 87, temperature 98.8 F (37.1 C), temperature source Oral, resp. rate 14, height 6' (1.829 m), weight 144 kg (317 lb 7.4 oz), SpO2 98 %.  Intake/Output Summary (Last 24 hours) at 05/15/15 1042 Last data filed at 05/15/15 1000  Gross per 24 hour  Intake 3629.47 ml  Output   4320 ml  Net -690.53 ml   Exam: General: No acute respiratory distress Lungs: Clear to auscultation bilaterally without wheezes or crackles Cardiovascular: Regular rate and rhythm without murmur gallop or rub normal S1 and S2 Abdomen: Nontender, nondistended, soft, bowel sounds positive, no rebound, no ascites, no appreciable mass Extremities: No significant cyanosis, clubbing, or edema bilateral lower extremities  Data Reviewed:  Basic Metabolic Panel:  Recent Labs Lab 05/10/15 0222  05/13/15 0415 05/13/15 1600 05/14/15 0215 05/14/15 1850 05/15/15 0438  NA 117*  < > 131* 134* 135 135 138  K 5.0  < > 3.3* 3.7 3.8 3.6 3.5  CL 87*  < > 93* 94* 97* 95* 99*  CO2 14*  < > 29 29 29 27 28   GLUCOSE 169*  < > 111* 101* 95 94 88  BUN 135*  < > 45* 39* 34* 28* 26*  CREATININE 4.41*  < > 2.01* 1.98* 1.86* 1.76* 1.71*  CALCIUM 8.5*  < > 9.1 9.5 9.0 9.1 9.1  MG 5.1*  --   --   --    --   --  2.4  PHOS 5.3*  --   --   --   --   --  2.7  < > = values in this interval not displayed.  CBC:  Recent Labs Lab 05/09/15 1000 05/09/15 1038 05/10/15 0222 05/13/15 0415 05/14/15 0215  WBC 11.5*  --  30.7* 9.3 7.1  HGB 12.6* 13.9 13.0 9.2* 8.6*  HCT 35.9* 41.0 36.5* 36.3* 26.5*  MCV 87.6  --  88.6 125.2* 96.4  PLT 306  --  303 195 214    Liver Function Tests:  Recent Labs Lab 05/15/15 0438  ALBUMIN 2.3*   Cardiac Enzymes:  Recent Labs Lab 05/09/15 1145 05/09/15 1450 05/09/15 2052  TROPONINI 0.07* 0.08* 0.07*    CBG:  Recent Labs Lab 05/09/15 1001 05/09/15 1239 05/11/15 0756 05/11/15 1221 05/11/15 1917  GLUCAP 122* 116* 145* 123* 104*    Recent Results (from the past 240 hour(s))  Culture, blood (routine x 2)     Status: None   Collection Time: 05/09/15 10:00 AM  Result Value Ref Range Status   Specimen Description BLOOD LEFT ANTECUBITAL  Final   Special Requests BOTTLES DRAWN AEROBIC AND ANAEROBIC 5MLS  Final   Culture NO GROWTH 5 DAYS  Final   Report Status 05/14/2015 FINAL  Final  Culture, blood (routine x 2)     Status: None   Collection Time: 05/09/15 10:30 AM  Result Value Ref Range Status   Specimen Description BLOOD RIGHT ANTECUBITAL  Final   Special Requests BOTTLES DRAWN AEROBIC AND ANAEROBIC 10MLS  Final   Culture NO GROWTH 5 DAYS  Final   Report Status 05/14/2015 FINAL  Final  Urine culture     Status: None   Collection Time: 05/09/15  1:30 PM  Result Value Ref Range Status   Specimen Description URINE, CLEAN CATCH  Final   Special Requests NONE  Final   Culture MULTIPLE SPECIES PRESENT, SUGGEST RECOLLECTION  Final   Report Status 05/10/2015 FINAL  Final  MRSA PCR Screening     Status: Abnormal   Collection Time: 05/09/15  4:05 PM  Result Value Ref Range Status   MRSA by PCR POSITIVE (A) NEGATIVE Final    Comment:        The GeneXpert MRSA Assay (FDA approved for NASAL specimens only), is one component of  a comprehensive MRSA colonization surveillance program. It is not intended to diagnose MRSA infection nor to guide or monitor treatment for MRSA infections. RESULT CALLED TO, READ BACK BY AND VERIFIED WITH: Bethel Born RN [id number] 05/09/15 A BROWNING      Studies:   Recent x-ray studies have been reviewed in detail by the Attending Physician  Scheduled Meds:  Scheduled Meds: . antiseptic oral rinse  7 mL Mouth Rinse q12n4p  .  cefTRIAXone (ROCEPHIN)  IV  2 g Intravenous Q24H  . chlorhexidine  15 mL Mouth Rinse BID  . docusate sodium  100 mg Oral Daily  . heparin  5,000 Units Subcutaneous 3 times per day  . hydrocerin   Topical Daily  . sodium chloride flush  10-40 mL Intracatheter Q12H    Time spent on care of this patient: 35 mins   Malak Duchesneau T , MD   [facility] Office  [phone] Pager - Text Page per Shea Evans as per below:  On-Call/Text Page:      [email or web address]      password [email or web address]  If 7PM-7AM, please contact night-coverage [email or web address] Password [email or web address] 05/15/2015, 10:42 AM   LOS: 6 days

## 2015-05-15 NOTE — Progress Notes (Signed)
Patient ID: Scott Shelton, male   DOB: 1951-02-22, 65 y.o.   MRN: [id number]     [address] [place], [place] [address]    Phone: [phone] FAX: [phone]     Subjective: 3.5L out, but had a BM and passing flatus.   Objective:  Vital signs:  Filed Vitals:   05/15/15 0500 05/15/15 0600 05/15/15 0700 05/15/15 0800  BP: 98/52 102/57 108/61 111/62  Pulse: 70 81 78 82  Temp:      TempSrc:      Resp: '13 15 16 16  '$ Height:      Weight:   144 kg (317 lb 7.4 oz)   SpO2: 94% 96% 95% 99%    Last BM Date: 05/14/15  Intake/Output   Yesterday:  02/05 0701 - 02/06 0700 In: 3089.5 [P.O.:510; I.V.:2379.5; NG/GT:150; IV Piggyback:50] Out: 0814 [Urine:1080; Emesis/NG output:3500] This shift:  Total I/O In: 91.7 [I.V.:91.7] Out: 130 [Urine:30; Emesis/NG output:100]    Physical Exam: General: Pt awake/alert/oriented x4 in no acute distress  Abdomen: Soft.  Nondistended.  Non tender.  No evidence of peritonitis.  No incarcerated hernias.   Problem List:   Active Problems:   AKI (acute kidney injury) ([place])   Hyperkalemia   Lactic acidosis   Hypotension   Hypothermia   Hyponatremia   Syncope   Cellulitis   Elevated troponin   ARF (acute renal failure) (H[facility])    Results:   Labs: Results for orders placed or performed during the hospital encounter of 05/09/15 (from the past 48 hour(s))  Basic metabolic panel     Status: Abnormal   Collection Time: 05/13/15  4:00 PM  Result Value Ref Range   Sodium 134 (L) 135 - 145 mmol/L   Potassium 3.7 3.5 - 5.1 mmol/L   Chloride 94 (L) 101 - 111 mmol/L   CO2 29 22 - 32 mmol/L   Glucose, Bld 101 (H) 65 - 99 mg/dL   BUN 39 (H) 6 - 20 mg/dL   Creatinine, Ser 1.98 (H) 0.61 - 1.24 mg/dL   Calcium 9.5 8.9 - 10.3 mg/dL   GFR calc non Af Amer 34 (L) >60 mL/min   GFR calc Af Amer 40 (L) >60 mL/min    Comment: (NOTE) The eGFR has been calculated using the CKD EPI  equation. This calculation has not been validated in all clinical situations. eGFR's persistently <60 mL/min signify possible Chronic Kidney Disease.    Anion gap 11 5 - 15  Basic metabolic panel     Status: Abnormal   Collection Time: 05/14/15  2:15 AM  Result Value Ref Range   Sodium 135 135 - 145 mmol/L   Potassium 3.8 3.5 - 5.1 mmol/L   Chloride 97 (L) 101 - 111 mmol/L   CO2 29 22 - 32 mmol/L   Glucose, Bld 95 65 - 99 mg/dL   BUN 34 (H) 6 - 20 mg/dL   Creatinine, Ser 1.86 (H) 0.61 - 1.24 mg/dL   Calcium 9.0 8.9 - 10.3 mg/dL   GFR calc non Af Amer 37 (L) >60 mL/min   GFR calc Af Amer 43 (L) >60 mL/min    Comment: (NOTE) The eGFR has been calculated using the CKD EPI equation. This calculation has not been validated in all clinical situations. eGFR's persistently <60 mL/min signify possible Chronic Kidney Disease.    Anion gap 9 5 - 15  CBC     Status: Abnormal  Collection Time: 05/14/15  2:15 AM  Result Value Ref Range   WBC 7.1 4.0 - 10.5 K/uL   RBC 2.75 (L) 4.22 - 5.81 MIL/uL   Hemoglobin 8.6 (L) 13.0 - 17.0 g/dL   HCT 26.5 (L) 39.0 - 52.0 %   MCV 96.4 78.0 - 100.0 fL    Comment: REPEATED TO VERIFY DELTA CHECK NOTED    MCH 31.3 26.0 - 34.0 pg   MCHC 32.5 30.0 - 36.0 g/dL   RDW 13.6 11.5 - 15.5 %   Platelets 214 150 - 400 K/uL  Basic metabolic panel     Status: Abnormal   Collection Time: 05/14/15  6:50 PM  Result Value Ref Range   Sodium 135 135 - 145 mmol/L   Potassium 3.6 3.5 - 5.1 mmol/L   Chloride 95 (L) 101 - 111 mmol/L   CO2 27 22 - 32 mmol/L   Glucose, Bld 94 65 - 99 mg/dL   BUN 28 (H) 6 - 20 mg/dL   Creatinine, Ser 1.76 (H) 0.61 - 1.24 mg/dL   Calcium 9.1 8.9 - 10.3 mg/dL   GFR calc non Af Amer 40 (L) >60 mL/min   GFR calc Af Amer 46 (L) >60 mL/min    Comment: (NOTE) The eGFR has been calculated using the CKD EPI equation. This calculation has not been validated in all clinical situations. eGFR's persistently <60 mL/min signify possible Chronic  Kidney Disease.    Anion gap 13 5 - 15  Renal function panel     Status: Abnormal   Collection Time: 05/15/15  4:38 AM  Result Value Ref Range   Sodium 138 135 - 145 mmol/L   Potassium 3.5 3.5 - 5.1 mmol/L   Chloride 99 (L) 101 - 111 mmol/L   CO2 28 22 - 32 mmol/L   Glucose, Bld 88 65 - 99 mg/dL   BUN 26 (H) 6 - 20 mg/dL   Creatinine, Ser 1.71 (H) 0.61 - 1.24 mg/dL   Calcium 9.1 8.9 - 10.3 mg/dL   Phosphorus 2.7 2.5 - 4.6 mg/dL   Albumin 2.3 (L) 3.5 - 5.0 g/dL   GFR calc non Af Amer 41 (L) >60 mL/min   GFR calc Af Amer 48 (L) >60 mL/min    Comment: (NOTE) The eGFR has been calculated using the CKD EPI equation. This calculation has not been validated in all clinical situations. eGFR's persistently <60 mL/min signify possible Chronic Kidney Disease.    Anion gap 11 5 - 15  Magnesium     Status: None   Collection Time: 05/15/15  4:38 AM  Result Value Ref Range   Magnesium 2.4 1.7 - 2.4 mg/dL    Imaging / Studies: No results found.  Medications / Allergies:  Scheduled Meds: . antiseptic oral rinse  7 mL Mouth Rinse q12n4p  . cefTRIAXone (ROCEPHIN)  IV  2 g Intravenous Q24H  . chlorhexidine  15 mL Mouth Rinse BID  . docusate sodium  100 mg Oral Daily  . heparin  5,000 Units Subcutaneous 3 times per day  . hydrocerin   Topical Daily  . sodium chloride flush  10-40 mL Intracatheter Q12H   Continuous Infusions: . sodium chloride 75 mL/hr at 05/15/15 0800  . amiodarone 30 mg/hr (05/15/15 0800)   PRN Meds:.acetaminophen, alum & mag hydroxide-simeth, bisacodyl, fentaNYL (SUBLIMAZE) injection, ondansetron (ZOFRAN) IV, phenol, sodium chloride flush  Antibiotics: Anti-infectives    Start     Dose/Rate Route Frequency Ordered Stop   05/09/15 1800  piperacillin-tazobactam (ZOSYN)  IVPB 2.25 g  Status:  Discontinued     2.25 g 100 mL/hr over 30 Minutes Intravenous 4 times per day 05/09/15 1056 05/09/15 1317   05/09/15 1400  cefTRIAXone (ROCEPHIN) 2 g in dextrose 5 % 50 mL IVPB      2 g 100 mL/hr over 30 Minutes Intravenous Every 24 hours 05/09/15 1352     05/09/15 1045  piperacillin-tazobactam (ZOSYN) IVPB 3.375 g     3.375 g 100 mL/hr over 30 Minutes Intravenous  Once 05/09/15 1035 05/09/15 1124   05/09/15 1045  vancomycin (VANCOCIN) 2,000 mg in sodium chloride 0.9 % 500 mL IVPB  Status:  Discontinued     2,000 mg 250 mL/hr over 120 Minutes Intravenous  Once 05/09/15 1036 05/09/15 1329        Assessment/Plan Ileus -clamp NGT and trial clears.  If n/v develops, place back to suction.   Erby Pian, [facility] Pager [phone]) For consults and floor pages call [phone]-[phone])  05/15/2015 8:33 AM

## 2015-05-15 NOTE — Evaluation (Signed)
Physical Therapy Evaluation Patient Details Name: Scott Shelton MRN: [id number] DOB: [birth year] Today's Date: 05/15/2015   History of Present Illness  65 year old male with no PMH. 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 1 year history of BLE edema and wounds. Suffered syncopal episode 1/31 and found to be hypotensive with acute renal failure, hypernatremia, hyperkalemia.  Clinical Impression  Patient presents with decreased mobility due to deficits listed in PT problem list.  He will benefit from skilled PT in the acute setting to allow return home independent following CIR level rehab stay.    Follow Up Recommendations CIR    Equipment Recommendations  Rolling walker with 5" wheels    Recommendations for Other Services Rehab consult     Precautions / Restrictions Precautions Precautions: Fall Precaution Comments: B LE wounds w/ wraps      Mobility  Bed Mobility               General bed mobility comments: NT pt in chair  Transfers Overall transfer level: Needs assistance Equipment used: Rolling walker (2 wheeled) Transfers: Sit to/from Stand Sit to Stand: Min assist         General transfer comment: steadying help  Ambulation/Gait Ambulation/Gait assistance: Min assist Ambulation Distance (Feet): 50 Feet Assistive device: Rolling walker (2 wheeled) Gait Pattern/deviations: Step-through pattern;Decreased stride length;Trunk flexed;Wide base of support;Shuffle     General Gait Details: assist/cues to turn walker, assist for balance, fatigues with ambulation (HR up to 123, SpO2 94)  Stairs            Wheelchair Mobility    Modified Rankin (Stroke Patients Only)       Balance Overall balance assessment: Needs assistance         Standing balance support: Bilateral upper extremity supported Standing balance-Leahy Scale: Poor Standing balance comment: UE support needed for balance                              Pertinent Vitals/Pain Pain Assessment: No/denies pain    Home Living Family/patient expects to be discharged to:: Inpatient rehab Living Arrangements: Parent   Type of Home: House Home Access: Stairs to enter Entrance Stairs-Rails: Psychiatric nurse of Steps: 6 Home Layout: One level Home Equipment: None      Prior Function Level of Independence: Independent         Comments: patient was caregiver for his father     Hand Dominance        Extremity/Trunk Assessment               Lower Extremity Assessment: RLE deficits/detail;LLE deficits/detail RLE Deficits / Details: AROM grossly WFL, strength 4/5, edema throughout with wraps on lower legs LLE Deficits / Details: AROM grossly WFL, strength 4/5, edema throughout with wraps on lower legs     Communication   Communication: No difficulties  Cognition Arousal/Alertness: Awake/alert Behavior During Therapy: WFL for tasks assessed/performed Overall Cognitive Status: Within Functional Limits for tasks assessed                      General Comments      Exercises        Assessment/Plan    PT Assessment Patient needs continued PT services  PT Diagnosis Difficulty walking;Generalized weakness   PT Problem List Decreased strength;Decreased activity tolerance;Decreased balance;Decreased mobility;Cardiopulmonary status limiting activity;Decreased knowledge of use of DME;Decreased  skin integrity;Decreased safety awareness  PT Treatment Interventions DME instruction;Balance training;Gait training;Stair training;Functional mobility training;Patient/family education;Therapeutic activities;Therapeutic exercise   PT Goals (Current goals can be found in the Care Plan section) Acute Rehab PT Goals Patient Stated Goal: To go to rehab prior to d/c home PT Goal Formulation: With patient Time For Goal Achievement: 05/22/15 Potential to Achieve Goals: Good     Frequency Min 3X/week   Barriers to discharge Decreased caregiver support      Co-evaluation               End of Session Equipment Utilized During Treatment: Gait belt Activity Tolerance: Patient limited by fatigue Patient left: in chair;with call bell/phone within reach;with nursing/sitter in room           Time: [id number] PT Time Calculation (min) (ACUTE ONLY): 30 min   Charges:   PT Evaluation $PT Eval Moderate Complexity: 1 Procedure PT Treatments $Gait Training: 8-22 mins   PT G CodesReginia Naas 05-18-15, 4:53 PM  Magda Kiel, [place] 2015-05-18

## 2015-05-15 NOTE — Plan of Care (Signed)
Problem: Safety: Goal: Ability to remain free from injury will improve Outcome: Not Progressing Patient refuses to be turned and repositioned besides explaining and emphasizing the importance of preventing pressure ulcers.   Problem: Pain Managment: Goal: General experience of comfort will improve Outcome: Progressing Patient reports mild abdominal pain with no nausea.   Problem: Bowel/Gastric: Goal: Will not experience complications related to bowel motility Outcome: Progressing NG output during my shift was 1865 cc. Requested ice chips for dry mouth, limited ice chips to 30 cc every two hours.

## 2015-05-15 NOTE — Progress Notes (Signed)
MD paged to make aware of d-dimer results; will cont. To monitor.  Scott Shelton Reason

## 2015-05-15 NOTE — Progress Notes (Signed)
Residual at this time > 257ml/hr; will keep NGT for now; pt denies N/V; NGT will remain clamped; will cont. To monitor.  Ruben Reason

## 2015-05-16 ENCOUNTER — Inpatient Hospital Stay (HOSPITAL_COMMUNITY): Payer: Self-pay

## 2015-05-16 DIAGNOSIS — A419 Sepsis, unspecified organism: Secondary | ICD-10-CM | POA: Diagnosis present

## 2015-05-16 DIAGNOSIS — K567 Ileus, unspecified: Secondary | ICD-10-CM | POA: Diagnosis present

## 2015-05-16 DIAGNOSIS — R6521 Severe sepsis with septic shock: Secondary | ICD-10-CM

## 2015-05-16 DIAGNOSIS — I82403 Acute embolism and thrombosis of unspecified deep veins of lower extremity, bilateral: Secondary | ICD-10-CM

## 2015-05-16 DIAGNOSIS — I4891 Unspecified atrial fibrillation: Secondary | ICD-10-CM | POA: Diagnosis present

## 2015-05-16 DIAGNOSIS — E876 Hypokalemia: Secondary | ICD-10-CM | POA: Diagnosis present

## 2015-05-16 LAB — CBC
HCT: 27 % — ABNORMAL LOW (ref 39.0–52.0)
HEMOGLOBIN: 8.4 g/dL — AB (ref 13.0–17.0)
MCH: 30.2 pg (ref 26.0–34.0)
MCHC: 31.1 g/dL (ref 30.0–36.0)
MCV: 97.1 fL (ref 78.0–100.0)
PLATELETS: 239 10*3/uL (ref 150–400)
RBC: 2.78 MIL/uL — AB (ref 4.22–5.81)
RDW: 13.7 % (ref 11.5–15.5)
WBC: 7.4 10*3/uL (ref 4.0–10.5)

## 2015-05-16 LAB — COMPREHENSIVE METABOLIC PANEL
ALBUMIN: 2.1 g/dL — AB (ref 3.5–5.0)
ALT: 14 U/L — ABNORMAL LOW (ref 17–63)
ANION GAP: 12 (ref 5–15)
AST: 19 U/L (ref 15–41)
Alkaline Phosphatase: 56 U/L (ref 38–126)
BUN: 17 mg/dL (ref 6–20)
CALCIUM: 8.8 mg/dL — AB (ref 8.9–10.3)
CO2: 28 mmol/L (ref 22–32)
Chloride: 95 mmol/L — ABNORMAL LOW (ref 101–111)
Creatinine, Ser: 1.53 mg/dL — ABNORMAL HIGH (ref 0.61–1.24)
GFR calc Af Amer: 55 mL/min — ABNORMAL LOW (ref >60)
GFR calc non Af Amer: 47 mL/min — ABNORMAL LOW (ref >60)
GLUCOSE: 91 mg/dL (ref 65–99)
POTASSIUM: 3.2 mmol/L — AB (ref 3.5–5.1)
SODIUM: 135 mmol/L (ref 135–145)
TOTAL PROTEIN: 4.8 g/dL — AB (ref 6.5–8.1)
Total Bilirubin: 0.5 mg/dL (ref 0.3–1.2)

## 2015-05-16 MED ORDER — POTASSIUM CHLORIDE 10 MEQ/50ML IV SOLN
10.0000 meq | INTRAVENOUS | Status: AC
Start: 2015-05-16 — End: 2015-05-16
  Administered 2015-05-16 (×3): 10 meq via INTRAVENOUS

## 2015-05-16 MED ORDER — POTASSIUM CHLORIDE 10 MEQ/50ML IV SOLN
INTRAVENOUS | Status: AC
  Administered 2015-05-16: 10 meq via INTRAVENOUS
  Filled 2015-05-16: qty 150

## 2015-05-16 MED ORDER — AMIODARONE HCL 200 MG PO TABS
200.0000 mg | ORAL_TABLET | Freq: Every day | ORAL | Status: DC
  Administered 2015-05-17 – 2015-05-23 (×7): 200 mg via ORAL
  Filled 2015-05-16 (×7): qty 1

## 2015-05-16 NOTE — Progress Notes (Signed)
[facility] Progress Note Patient Name: Scott Shelton DOB: 07-19-1950 MRN: [id number]   Date of Service  05/16/2015  HPI/Events of Note  K+ = 3.2 and Creatinine = 1.53  eICU Interventions  Will replete K+.     Intervention Category Intermediate Interventions: Electrolyte abnormality - evaluation and management  Aaleigha Bozza [place] 05/16/2015, 6:42 AM

## 2015-05-16 NOTE — Progress Notes (Signed)
[facility] TEAM 1 - Stepdown/ICU TEAM Progress Note  Scott Shelton [id number] DOB: March 18, 1951 DOA: 05/09/2015 PCP: No primary care provider on file.  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   About one year prior he developed small red punctate lesions to bilateral lower extremities from the calves down. These lesions began to spread and overwhelmed the entirety of the legs. Over a few weeks prior to his admit he developed DOE with simple tasks that would have been easy for him before. 1/31 he suffered a syncopal episode during which he fell back onto a couch into a seated position. He awoke spontaneously about 3-4 minutes later. He presented to ED where he was found to be hypotensive 94/80 and hypothermic 96.73F. Laboratory evaluation was significant for Creatinine 5.82, K 7.3, Na 109, Cl 79. Also had some mild leukocytosis with WBC 11.5.   HPI/Subjective: 2/7 A/O x 4, Negative SOB, N/V/D, negative leg pain. Negative Hx of A-Fib  Assessment/Plan:  Shock, presumed septic + hypovolemic -Resolved - weaned off pressors 2/4   Severe sepsis secondary to RLE cellulitis -Continues to improve - cont current abx - follow w/ ongoing wound care - given B nature, I doubt DVT as an etiology and suspect extent of edema would make venous duplex nearly useless - I will check a d-dimer - cont current dressings, Ambulate q shift  Newly diagnosed Atrial Fib -DC amiodarone drip  -Start amiodarone PO 200 mg BID -Currently in NSR -Long-term anticoagulation?  Acute renal failure -cr improving with good UOP in post-ATN diuresis phase of his renal injury - follow   Hypokalemia  -Potassium IV 30 mEq  Hyponatremia -Hypovolemic - resolved   Ileus CT abd 2/3 c/w ileus  - Gen Surgery following  -Resolved now on full liquids and tolerating well  Anemia in of setting renal  failure -Hgb stable - avoid IV Fe load in setting of probable acute infection    Code Status: FULL Family Communication: no family present at time of exam Disposition Plan: CIR vs SNF    Consultants: Dr.Matthew Wakefield CCS   Procedure/Significant Events: 2/1 Echocardiogram;- Left ventricle: moderate LVH. -LVEF= 65% to 70%.  2/7 bilateral LE Doppler;-No obvious DVT noted in visualized veins. However, calf veins were poorly visualized.    Culture   Antibiotics: Zosyn/Vanc 1/31  Rocephin 1/31 >>  DVT prophylaxis: Subcutaneous heparin   Devices NA   LINES / TUBES:  NA    Continuous Infusions: . sodium chloride 60 mL/hr at 05/16/15 2206    Objective: VITAL SIGNS: Temp: 98.3 F (36.8 C) (02/07 2155) Temp Source: Oral (02/07 2155) BP: 127/68 mmHg (02/07 2155) Pulse Rate: 77 (02/07 2155) SPO2; FIO2:   Intake/Output Summary (Last 24 hours) at 05/16/15 2241 Last data filed at 05/16/15 2228  Gross per 24 hour  Intake 3044.1 ml  Output   1410 ml  Net 1634.1 ml     Exam: General:  A/O x 4, NAD, No acute respiratory distress Eyes: Negative headache, negative scleral hemorrhage ENT: Negative Runny nose, negative gingival bleeding, Neck:  Negative scars, masses, torticollis, lymphadenopathy, JVD Lungs: Clear to auscultation bilaterally without wheezes or crackles Cardiovascular: Regular rate and rhythm without murmur gallop or rub normal S1 and S2 Abdomen: Morbidly Obese, negative abdominal pain, nondistended, positive soft, bowel sounds, no rebound, no ascites, no appreciable mass Extremities: Positive bilat LE cyanosis, Edema, Did not fully remove  dressings  Psychiatric:  Negative depression, negative anxiety, negative fatigue, negative mania  Neurologic:  Cranial nerves II through XII intact, tongue/uvula midline, all extremities muscle strength 5/5, sensation intact throughout, negative dysarthria, negative expressive aphasia, negative receptive  aphasia.   Data Reviewed: Basic Metabolic Panel:  Recent Labs Lab 05/10/15 0222  05/13/15 1600 05/14/15 0215 05/14/15 1850 05/15/15 0438 05/16/15 0508  NA 117*  < > 134* 135 135 138 135  K 5.0  < > 3.7 3.8 3.6 3.5 3.2*  CL 87*  < > 94* 97* 95* 99* 95*  CO2 14*  < > 29 29 27 28 28   GLUCOSE 169*  < > 101* 95 94 88 91  BUN 135*  < > 39* 34* 28* 26* 17  CREATININE 4.41*  < > 1.98* 1.86* 1.76* 1.71* 1.53*  CALCIUM 8.5*  < > 9.5 9.0 9.1 9.1 8.8*  MG 5.1*  --   --   --   --  2.4  --   PHOS 5.3*  --   --   --   --  2.7  --   < > = values in this interval not displayed. Liver Function Tests:  Recent Labs Lab 05/15/15 0438 05/16/15 0508  AST  --  19  ALT  --  14*  ALKPHOS  --  56  BILITOT  --  0.5  PROT  --  4.8*  ALBUMIN 2.3* 2.1*   No results for input(s): LIPASE, AMYLASE in the last 168 hours. No results for input(s): AMMONIA in the last 168 hours. CBC:  Recent Labs Lab 05/10/15 0222 05/13/15 0415 05/14/15 0215 05/16/15 0508  WBC 30.7* 9.3 7.1 7.4  HGB 13.0 9.2* 8.6* 8.4*  HCT 36.5* 36.3* 26.5* 27.0*  MCV 88.6 95.3 96.4 97.1  PLT 303 195 214 239   Cardiac Enzymes: No results for input(s): CKTOTAL, CKMB, CKMBINDEX, TROPONINI in the last 168 hours. BNP (last 3 results) No results for input(s): BNP in the last 8760 hours.  ProBNP (last 3 results) No results for input(s): PROBNP in the last 8760 hours.  CBG:  Recent Labs Lab 05/11/15 0756 05/11/15 1221 05/11/15 1917  GLUCAP 145* 123* 104*    Recent Results (from the past 240 hour(s))  Culture, blood (routine x 2)     Status: None   Collection Time: 05/09/15 10:00 AM  Result Value Ref Range Status   Specimen Description BLOOD LEFT ANTECUBITAL  Final   Special Requests BOTTLES DRAWN AEROBIC AND ANAEROBIC 5MLS  Final   Culture NO GROWTH 5 DAYS  Final   Report Status 05/14/2015 FINAL  Final  Culture, blood (routine x 2)     Status: None   Collection Time: 05/09/15 10:30 AM  Result Value Ref Range  Status   Specimen Description BLOOD RIGHT ANTECUBITAL  Final   Special Requests BOTTLES DRAWN AEROBIC AND ANAEROBIC 10MLS  Final   Culture NO GROWTH 5 DAYS  Final   Report Status 05/14/2015 FINAL  Final  Urine culture     Status: None   Collection Time: 05/09/15  1:30 PM  Result Value Ref Range Status   Specimen Description URINE, CLEAN CATCH  Final   Special Requests NONE  Final   Culture MULTIPLE SPECIES PRESENT, SUGGEST RECOLLECTION  Final   Report Status 05/10/2015 FINAL  Final  MRSA PCR Screening     Status: Abnormal   Collection Time: 05/09/15  4:05 PM  Result Value Ref Range Status   MRSA by PCR POSITIVE (A) NEGATIVE  Final    Comment:        The GeneXpert MRSA Assay (FDA approved for NASAL specimens only), is one component of a comprehensive MRSA colonization surveillance program. It is not intended to diagnose MRSA infection nor to guide or monitor treatment for MRSA infections. RESULT CALLED TO, READ BACK BY AND VERIFIED WITHBethel Born RN [id number] 05/09/15 A BROWNING      Studies:  Recent x-ray studies have been reviewed in detail by the Attending Physician  Scheduled Meds:  Scheduled Meds: . [START ON 05/17/2015] amiodarone  200 mg Oral Daily  . antiseptic oral rinse  7 mL Mouth Rinse q12n4p  . cefTRIAXone (ROCEPHIN)  IV  2 g Intravenous Q24H  . chlorhexidine  15 mL Mouth Rinse BID  . docusate sodium  100 mg Oral Daily  . heparin  5,000 Units Subcutaneous 3 times per day  . hydrocerin   Topical Daily    Time spent on care of this patient: 40 mins   Scott Shelton, Scott Shelton , MD  [facility] Office  [phone] Pager [phone]  On-Call/Text Page:      [email or web address]      password [email or web address]  If 7PM-7AM, please contact night-coverage [email or web address] Password [email or web address] 05/16/2015, 10:41 PM   LOS: 7 days   Care during the described time interval was provided by me .  I have reviewed this patient's available data, including medical history, events of note,  physical examination, and all test results as part of my evaluation. I have personally reviewed and interpreted all radiology studies.   Dia Crawford, MD [phone] Pager

## 2015-05-16 NOTE — Progress Notes (Signed)
Pt transferred to 2C03 via bed, vss, receiving RN at bedside, all pt belongings at bedside, no family listed to notify of transfer, pt made aware and will notify necessary party. Scott Shelton 9:58 PM

## 2015-05-16 NOTE — Evaluation (Signed)
Occupational Therapy Evaluation Patient Details Name: Scott Shelton MRN: [id number] DOB: [birth year] Today's Date: 05/16/2015    History of Present Illness 65 year old male with no PMH. 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 1 year history of BLE edema and wounds. Suffered syncopal episode 1/31 and found to be hypotensive with acute renal failure, hypernatremia, hyperkalemia.   Clinical Impression   Pt was struggling to care for himself and his father in the weeks leading up to admission.  Pt presents with generalized weakness, decreased activity tolerance, impaired balance and dependence in LB ADL.  Pt has the potential to return to a modified independent level with intensive rehab.  Will follow acutely.    Follow Up Recommendations  CIR    Equipment Recommendations  Tub/shower seat vs tub bench   Recommendations for Other Services       Precautions / Restrictions Precautions Precautions: Fall Precaution Comments: B LE wounds w/ wraps Restrictions Weight Bearing Restrictions: No      Mobility Bed Mobility               General bed mobility comments: NT pt in chair  Transfers Overall transfer level: Needs assistance Equipment used: Rolling walker (2 wheeled) Transfers: Sit to/from Stand Sit to Stand: Min assist         General transfer comment: heavy reliance on UEs, used momentum, steadying assist, reports dizziness upon initial standing    Balance             Standing balance-Leahy Scale: Fair                              ADL Overall ADL's : Needs assistance/impaired Eating/Feeding: Independent;Sitting   Grooming: Min guard;Standing;Wash/dry hands Grooming Details (indicate cue type and reason): only able to tolerate one activity  Upper Body Bathing: Set up;Sitting   Lower Body Bathing: Maximal assistance;Sit to/from stand   Upper Body Dressing : Set up;Sitting   Lower Body  Dressing: Maximal assistance;Sit to/from stand   Toilet Transfer: Minimal assistance;Ambulation;RW   Toileting- Clothing Manipulation and Hygiene: Minimal assistance;Sit to/from stand       Functional mobility during ADLs: Minimal assistance;Rolling walker       Vision Additional Comments: hx of cataract sx   Perception     Praxis      Pertinent Vitals/Pain Pain Assessment: No/denies pain     Hand Dominance Right   Extremity/Trunk Assessment Upper Extremity Assessment Upper Extremity Assessment: Overall WFL for tasks assessed   Lower Extremity Assessment Lower Extremity Assessment: Defer to PT evaluation   Cervical / Trunk Assessment Cervical / Trunk Assessment: Normal   Communication Communication Communication: No difficulties   Cognition Arousal/Alertness: Awake/alert Behavior During Therapy: WFL for tasks assessed/performed Overall Cognitive Status: Within Functional Limits for tasks assessed                     General Comments       Exercises       Shoulder Instructions      Home Living Family/patient expects to be discharged to:: Private residence Living Arrangements: Parent   Type of Home: House Home Access: Stairs to enter Technical brewer of Steps: 6 Entrance Stairs-Rails: Right;Left Home Layout: One level     Bathroom Shower/Tub: Teacher, early years/pre: Standard     Home Equipment: Bedside commode  Prior Functioning/Environment Level of Independence: Independent        Comments: patient was caregiver for his father    OT Diagnosis: Generalized weakness   OT Problem List: Decreased strength;Decreased activity tolerance;Impaired balance (sitting and/or standing);Decreased knowledge of use of DME or AE;Decreased knowledge of precautions;Obesity;Increased edema   OT Treatment/Interventions: Self-care/ADL training;DME and/or AE instruction;Balance training;Patient/family education;Energy  conservation    OT Goals(Current goals can be found in the care plan section) Acute Rehab OT Goals Patient Stated Goal: To go to rehab prior to d/c home OT Goal Formulation: With patient Time For Goal Achievement: 05/30/15 Potential to Achieve Goals: Good ADL Goals Pt Will Perform Grooming: with modified independence;standing (3 activities) Pt Will Perform Lower Body Bathing: with modified independence;with adaptive equipment;sit to/from stand Pt Will Perform Lower Body Dressing: with modified independence;with adaptive equipment;sit to/from stand Pt Will Transfer to Toilet: with modified independence;ambulating Pt Will Perform Toileting - Clothing Manipulation and hygiene: with modified independence;sit to/from stand Pt Will Perform Tub/Shower Transfer: Tub transfer;with modified independence;ambulating;shower seat Additional ADL Goal #1: Pt will generalize energy conservation strategies in ADL independently.  OT Frequency: Min 2X/week   Barriers to D/C:            Co-evaluation              End of Session Equipment Utilized During Treatment: Gait belt;Rolling walker  Activity Tolerance: Patient limited by fatigue Patient left: in chair;with call bell/phone within reach   Time: 1454-1530 OT Time Calculation (min): 36 min Charges:  OT General Charges $OT Visit: 1 Procedure OT Evaluation $OT Eval Moderate Complexity: 1 Procedure OT Treatments $Self Care/Home Management : 8-22 mins G-Codes:    Malka So 05/16/2015, 3:36 PM  [phone]

## 2015-05-16 NOTE — Progress Notes (Signed)
MD paged at this time; pt currently on Amio gtt taking PO's well; MD asked if Amio could be switched to PO at this time; also pt with current orders to transfer to stepdown; asked if pt could be downgraded to tele or med surg; will cont. To monitor.  Ruben Reason

## 2015-05-16 NOTE — Progress Notes (Signed)
Rehab Admissions Coordinator Note:  Patient was screened by Retta Diones for appropriateness for an Inpatient Acute Rehab Consult.  At this time, we are recommending Inpatient Rehab consult.  Jodell Cipro M 05/16/2015, 10:00 AM  I can be reached at [phone].

## 2015-05-16 NOTE — Progress Notes (Signed)
*  PRELIMINARY RESULTS* Vascular Ultrasound Lower extremity venous duplex has been completed.  Preliminary findings: Technically limited due to body habitus and thickened skin of lower legs. No obvious DVT noted in visualized veins. However, calf veins were poorly visualized.   Landry Mellow, RDMS, RVT  05/16/2015, 10:47 AM

## 2015-05-16 NOTE — Progress Notes (Signed)
Patient ID: Scott Shelton, male   DOB: 12-27-1950, 65 y.o.   MRN: [id number]     [facility]      [address] [place], [place] [address]    Phone: [phone] FAX: [phone]     Subjective: 2 BMs.  No n/v.  ngt is back to suction.  Objective:  Vital signs:  Filed Vitals:   05/16/15 0500 05/16/15 0600 05/16/15 0700 05/16/15 0726  BP: 104/57 124/68 104/57   Pulse: 74 79 77   Temp:    99.4 F (37.4 C)  TempSrc:    Oral  Resp: '13 18 15   '$ Height:      Weight:  141.8 kg (312 lb 9.8 oz)    SpO2: 96% 97% 96%     Last BM Date: 05/14/15  Intake/Output   Yesterday:  02/06 0701 - 02/07 0700 In: 3653.1 [P.O.:1560; I.V.:1903.1; NG/GT:90; IV Piggyback:100] Out: 2330 [Urine:930; Emesis/NG output:1400] This shift: I/O last 3 completed shifts: In: 5053.5 [P.O.:1770; I.V.:3003.5; NG/GT:180; IV Piggyback:100] Out: 4220 [Urine:1470; Emesis/NG output:2750]    Physical Exam: General: Pt awake/alert/oriented x4 in no acute distress Abdomen: Soft.  Nondistended.  Non tender.  No evidence of peritonitis.  No incarcerated hernias.    Problem List:   Active Problems:   AKI (acute kidney injury) ([place])   Hyperkalemia   Lactic acidosis   Hypotension   Hypothermia   Hyponatremia   Syncope   Cellulitis   Elevated troponin   ARF (acute renal failure) ([place])    Results:   Labs: Results for orders placed or performed during the hospital encounter of 05/09/15 (from the past 48 hour(s))  Basic metabolic panel     Status: Abnormal   Collection Time: 05/14/15  6:50 PM  Result Value Ref Range   Sodium 135 135 - 145 mmol/L   Potassium 3.6 3.5 - 5.1 mmol/L   Chloride 95 (L) 101 - 111 mmol/L   CO2 27 22 - 32 mmol/L   Glucose, Bld 94 65 - 99 mg/dL   BUN 28 (H) 6 - 20 mg/dL   Creatinine, Ser 1.76 (H) 0.61 - 1.24 mg/dL   Calcium 9.1 8.9 - 10.3 mg/dL   GFR calc non Af Amer 40 (L) >60 mL/min   GFR calc Af Amer 46 (L) >60 mL/min   Comment: (NOTE) The eGFR has been calculated using the CKD EPI equation. This calculation has not been validated in all clinical situations. eGFR's persistently <60 mL/min signify possible Chronic Kidney Disease.    Anion gap 13 5 - 15  Renal function panel     Status: Abnormal   Collection Time: 05/15/15  4:38 AM  Result Value Ref Range   Sodium 138 135 - 145 mmol/L   Potassium 3.5 3.5 - 5.1 mmol/L   Chloride 99 (L) 101 - 111 mmol/L   CO2 28 22 - 32 mmol/L   Glucose, Bld 88 65 - 99 mg/dL   BUN 26 (H) 6 - 20 mg/dL   Creatinine, Ser 1.71 (H) 0.61 - 1.24 mg/dL   Calcium 9.1 8.9 - 10.3 mg/dL   Phosphorus 2.7 2.5 - 4.6 mg/dL   Albumin 2.3 (L) 3.5 - 5.0 g/dL   GFR calc non Af Amer 41 (L) >60 mL/min   GFR calc Af Amer 48 (L) >60 mL/min    Comment: (NOTE) The eGFR has been calculated using the CKD EPI equation. This calculation has not been validated in all clinical situations. eGFR's persistently <60  mL/min signify possible Chronic Kidney Disease.    Anion gap 11 5 - 15  Magnesium     Status: None   Collection Time: 05/15/15  4:38 AM  Result Value Ref Range   Magnesium 2.4 1.7 - 2.4 mg/dL  D-dimer, quantitative (not at [facility])     Status: Abnormal   Collection Time: 05/15/15 11:30 AM  Result Value Ref Range   D-Dimer, Quant 3.82 (H) 0.00 - 0.50 ug/mL-FEU    Comment: (NOTE) At the manufacturer cut-off of 0.50 ug/mL FEU, this assay has been documented to exclude PE with a sensitivity and negative predictive value of 97 to 99%.  At this time, this assay has not been approved by the FDA to exclude DVT/VTE. Results should be correlated with clinical presentation.   Comprehensive metabolic panel     Status: Abnormal   Collection Time: 05/16/15  5:08 AM  Result Value Ref Range   Sodium 135 135 - 145 mmol/L   Potassium 3.2 (L) 3.5 - 5.1 mmol/L   Chloride 95 (L) 101 - 111 mmol/L   CO2 28 22 - 32 mmol/L   Glucose, Bld 91 65 - 99 mg/dL   BUN 17 6 - 20 mg/dL   Creatinine, Ser 5.78  (H) 0.61 - 1.24 mg/dL   Calcium 8.8 (L) 8.9 - 10.3 mg/dL   Total Protein 4.8 (L) 6.5 - 8.1 g/dL   Albumin 2.1 (L) 3.5 - 5.0 g/dL   AST 19 15 - 41 U/L   ALT 14 (L) 17 - 63 U/L   Alkaline Phosphatase 56 38 - 126 U/L   Total Bilirubin 0.5 0.3 - 1.2 mg/dL   GFR calc non Af Amer 47 (L) >60 mL/min   GFR calc Af Amer 55 (L) >60 mL/min    Comment: (NOTE) The eGFR has been calculated using the CKD EPI equation. This calculation has not been validated in all clinical situations. eGFR's persistently <60 mL/min signify possible Chronic Kidney Disease.    Anion gap 12 5 - 15  CBC     Status: Abnormal   Collection Time: 05/16/15  5:08 AM  Result Value Ref Range   WBC 7.4 4.0 - 10.5 K/uL   RBC 2.78 (L) 4.22 - 5.81 MIL/uL   Hemoglobin 8.4 (L) 13.0 - 17.0 g/dL   HCT 42.9 (L) 27.7 - 26.2 %   MCV 97.1 78.0 - 100.0 fL   MCH 30.2 26.0 - 34.0 pg   MCHC 31.1 30.0 - 36.0 g/dL   RDW 42.0 91.1 - 53.5 %   Platelets 239 150 - 400 K/uL    Imaging / Studies: No results found.  Medications / Allergies:  Scheduled Meds: . antiseptic oral rinse  7 mL Mouth Rinse q12n4p  . cefTRIAXone (ROCEPHIN)  IV  2 g Intravenous Q24H  . chlorhexidine  15 mL Mouth Rinse BID  . docusate sodium  100 mg Oral Daily  . heparin  5,000 Units Subcutaneous 3 times per day  . hydrocerin   Topical Daily  . potassium chloride  10 mEq Intravenous Q1 Hr x 3   Continuous Infusions: . sodium chloride 60 mL/hr at 05/16/15 0700  . amiodarone 30 mg/hr (05/16/15 0700)   PRN Meds:.acetaminophen, alum & mag hydroxide-simeth, bisacodyl, fentaNYL (SUBLIMAZE) injection, ondansetron (ZOFRAN) IV, phenol  Antibiotics: Anti-infectives    Start     Dose/Rate Route Frequency Ordered Stop   05/09/15 1800  piperacillin-tazobactam (ZOSYN) IVPB 2.25 g  Status:  Discontinued     2.25 g 100 mL/hr  over 30 Minutes Intravenous 4 times per day 05/09/15 1056 05/09/15 1317   05/09/15 1400  cefTRIAXone (ROCEPHIN) 2 g in dextrose 5 % 50 mL IVPB     2  g 100 mL/hr over 30 Minutes Intravenous Every 24 hours 05/09/15 1352     05/09/15 1045  piperacillin-tazobactam (ZOSYN) IVPB 3.375 g     3.375 g 100 mL/hr over 30 Minutes Intravenous  Once 05/09/15 1035 05/09/15 1124   05/09/15 1045  vancomycin (VANCOCIN) 2,000 mg in sodium chloride 0.9 % 500 mL IVPB  Status:  Discontinued     2,000 mg 250 mL/hr over 120 Minutes Intravenous  Once 05/09/15 1036 05/09/15 1329        Assessment/Plan Ileus-resolved, DC NGT and give fulls, advance diet as tolerated.  Please call [facility] for further assistance.   Erby Pian, [facility] Surgery Pager [phone]) For consults and floor pages call [phone](7A-[phone])  05/16/2015 8:01 AM

## 2015-05-16 NOTE — Progress Notes (Signed)
Report attempted to be called to RN on [facility], unable to take report at this time, a/w callback. Sherrie Mustache 8:58 PM

## 2015-05-16 NOTE — Progress Notes (Signed)
Physical Therapy Treatment Patient Details Name: Johnathan Mound MRN: [id number] DOB: [birth year] Today's Date: 05/16/2015    History of Present Illness 65 year old male with no PMH. 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 1 year history of BLE edema and wounds. Suffered syncopal episode 1/31 and found to be hypotensive with acute renal failure, hypernatremia, hyperkalemia.    PT Comments    Progressing slowly.  Mobility improving; though dyspneic and EHR elevated to the 120's, sats are great at 99%.  Discussed the importance of keeping his legs elevated.  Follow Up Recommendations  CIR     Equipment Recommendations  Rolling walker with 5" wheels    Recommendations for Other Services Rehab consult     Precautions / Restrictions Precautions Precautions: Fall Precaution Comments: B LE wounds w/ wraps Restrictions Weight Bearing Restrictions: No    Mobility  Bed Mobility               General bed mobility comments: NT pt in chair  Transfers Overall transfer level: Needs assistance Equipment used: Rolling walker (2 wheeled) Transfers: Sit to/from Stand Sit to Stand: Min guard         General transfer comment: reliance on UE's , but no assist needed  Ambulation/Gait Ambulation/Gait assistance: Min assist Ambulation Distance (Feet): 120 Feet Assistive device:  (pushed w/c) Gait Pattern/deviations: Step-through pattern Gait velocity: slow Gait velocity interpretation: Below normal speed for age/gender General Gait Details: generally steady pushing w/c, moderately dyspneic, EHR 123 bpm and SpO2 at 99%   Stairs            Wheelchair Mobility    Modified Rankin (Stroke Patients Only)       Balance             Standing balance-Leahy Scale: Fair                      Cognition Arousal/Alertness: Awake/alert Behavior During Therapy: WFL for tasks assessed/performed Overall Cognitive  Status: Within Functional Limits for tasks assessed                      Exercises      General Comments        Pertinent Vitals/Pain Pain Assessment: No/denies pain    Home Living Family/patient expects to be discharged to:: Private residence Living Arrangements: Parent   Type of Home: House Home Access: Stairs to enter Entrance Stairs-Rails: Right;Left Home Layout: One level Home Equipment: Bedside commode      Prior Function Level of Independence: Independent      Comments: patient was caregiver for his father   PT Goals (current goals can now be found in the care plan section) Acute Rehab PT Goals Patient Stated Goal: To go to rehab prior to d/c home PT Goal Formulation: With patient Time For Goal Achievement: 05/22/15 Potential to Achieve Goals: Good Progress towards PT goals: Progressing toward goals    Frequency  Min 3X/week    PT Plan Current plan remains appropriate    Co-evaluation             End of Session   Activity Tolerance: Patient tolerated treatment well;Patient limited by fatigue Patient left: in chair;with call bell/phone within reach     Time: [id number] PT Time Calculation (min) (ACUTE ONLY): 24 min  Charges:  $Gait Training: 8-22 mins $Therapeutic Activity: 8-22 mins  G Codes:      Hoy Fallert, Tessie Fass 05/16/2015, 4:37 PM 05/16/2015  Donnella Sham, PT [phone]  (pager)

## 2015-05-16 NOTE — Progress Notes (Signed)
Report called to [facility] on [place]. Scott Shelton 9:38 PM

## 2015-05-17 DIAGNOSIS — R5381 Other malaise: Secondary | ICD-10-CM

## 2015-05-17 DIAGNOSIS — I480 Paroxysmal atrial fibrillation: Secondary | ICD-10-CM

## 2015-05-17 DIAGNOSIS — K567 Ileus, unspecified: Secondary | ICD-10-CM

## 2015-05-17 LAB — MAGNESIUM: MAGNESIUM: 1.8 mg/dL (ref 1.7–2.4)

## 2015-05-17 LAB — CBC WITH DIFFERENTIAL/PLATELET
BASOS ABS: 0 10*3/uL (ref 0.0–0.1)
BASOS PCT: 0 %
EOS PCT: 3 %
Eosinophils Absolute: 0.2 10*3/uL (ref 0.0–0.7)
HEMATOCRIT: 27 % — AB (ref 39.0–52.0)
HEMOGLOBIN: 8.8 g/dL — AB (ref 13.0–17.0)
LYMPHS ABS: 1.2 10*3/uL (ref 0.7–4.0)
LYMPHS PCT: 18 %
MCH: 31.4 pg (ref 26.0–34.0)
MCHC: 32.6 g/dL (ref 30.0–36.0)
MCV: 96.4 fL (ref 78.0–100.0)
MONOS PCT: 11 %
Monocytes Absolute: 0.7 10*3/uL (ref 0.1–1.0)
NEUTROS ABS: 4.7 10*3/uL (ref 1.7–7.7)
Neutrophils Relative %: 68 %
Platelets: 210 10*3/uL (ref 150–400)
RBC: 2.8 MIL/uL — ABNORMAL LOW (ref 4.22–5.81)
RDW: 13.7 % (ref 11.5–15.5)
WBC: 6.8 10*3/uL (ref 4.0–10.5)

## 2015-05-17 LAB — BASIC METABOLIC PANEL
Anion gap: 10 (ref 5–15)
BUN: 12 mg/dL (ref 6–20)
CHLORIDE: 97 mmol/L — AB (ref 101–111)
CO2: 29 mmol/L (ref 22–32)
CREATININE: 1.39 mg/dL — AB (ref 0.61–1.24)
Calcium: 9.3 mg/dL (ref 8.9–10.3)
GFR calc Af Amer: >60 mL/min (ref >60)
GFR calc non Af Amer: 53 mL/min — ABNORMAL LOW (ref >60)
GLUCOSE: 96 mg/dL (ref 65–99)
POTASSIUM: 3.2 mmol/L — AB (ref 3.5–5.1)
Sodium: 136 mmol/L (ref 135–145)

## 2015-05-17 MED ORDER — CHLORHEXIDINE GLUCONATE CLOTH 2 % EX PADS
6.0000 | MEDICATED_PAD | Freq: Every day | CUTANEOUS | Status: DC
Start: 2015-05-17 — End: 2015-05-17

## 2015-05-17 MED ORDER — MUPIROCIN 2 % EX OINT
1.0000 "application " | TOPICAL_OINTMENT | Freq: Two times a day (BID) | CUTANEOUS | Status: AC
  Administered 2015-05-17 – 2015-05-21 (×10): 1 via NASAL
  Filled 2015-05-17 (×4): qty 22

## 2015-05-17 MED ORDER — POTASSIUM CHLORIDE CRYS ER 20 MEQ PO TBCR
40.0000 meq | EXTENDED_RELEASE_TABLET | Freq: Once | ORAL | Status: AC
  Administered 2015-05-17: 40 meq via ORAL
  Filled 2015-05-17: qty 2

## 2015-05-17 NOTE — Consult Note (Signed)
Physical Medicine and Rehabilitation Consult Reason for Consult: Debilitation/sepsis/multi-medical Referring Physician: [facility]   HPI: Scott Shelton is a 65 y.o. right handed male with unremarkable past medical history. 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 Presented 05/09/2015 with bilateral lower extremity edema with small red punctate lesions to bilateral lower extremities as well as syncopal episode. He was hypotensive in the ED 94/80 and hypothermic 96.8. Evaluate creatinine 5.82. Chest x-ray negative. Renal ultrasound unremarkable. Nephrology service was consulted for acute renal failure. Placed on gentle IV fluids. Creatinine has rebounded to 1.53. Hospital course new-onset diagnosed atrial fibrillation placed on amiodarone and converted to normal sinus rhythm. MRSA PCR screen positive with contact precautions. Subcutaneous heparin for DVT prophylaxis. Lower extremity cellulitis currently maintained on Rocephin. Venous Doppler studies negative. Patient developed ileus requiring nasogastric tube diet slowly advanced follow-up per general surgery.  Patient states that his father is in a skilled nursing facility.  Review of Systems  Constitutional: Negative for fever and chills.  HENT: Negative for hearing loss.   Eyes: Negative for blurred vision and double vision.  Respiratory: Positive for cough and shortness of breath.   Cardiovascular: Positive for palpitations and leg swelling. Negative for chest pain.  Gastrointestinal: Positive for constipation. Negative for nausea and vomiting.       GERD  Genitourinary: Positive for urgency. Negative for dysuria and hematuria.  Musculoskeletal: Positive for myalgias, back pain and joint pain.  Skin: Negative for rash.  Neurological: Positive for dizziness and weakness. Negative for seizures, loss of consciousness and headaches.       Syncopal episodes in the  past  All other systems reviewed and are negative.  Past Medical History  Diagnosis Date  . GERD (gastroesophageal reflux disease)    Past Surgical History  Procedure Laterality Date  . Cataract extraction     No family history on file. Social History:  reports that he has never smoked. He does not have any smokeless tobacco history on file. He reports that he does not drink alcohol or use illicit drugs. Allergies: No Known Allergies Medications Prior to Admission  Medication Sig Dispense Refill  . aluminum-magnesium hydroxide-simethicone (MAALOX) [id number] MG/5ML SUSP Take 30 mLs by mouth 3 (three) times daily as needed (acid reflux). Acid reflux      Home: Home Living Family/patient expects to be discharged to:: Private residence Living Arrangements: Parent Type of Home: House Home Access: Stairs to enter Technical brewer of Steps: 6 Entrance Stairs-Rails: Right, Left Home Layout: One level Bathroom Shower/Tub: Chiropodist: Standard Home Equipment: Bedside commode  Functional History: Prior Function Level of Independence: Independent Comments: patient was caregiver for his father Functional Status:  Mobility: Bed Mobility General bed mobility comments: NT pt in chair Transfers Overall transfer level: Needs assistance Equipment used: Rolling walker (2 wheeled) Transfers: Sit to/from Stand Sit to Stand: Min guard General transfer comment: reliance on UE's , but no assist needed Ambulation/Gait Ambulation/Gait assistance: Min assist Ambulation Distance (Feet): 120 Feet Assistive device:  (pushed w/c) Gait Pattern/deviations: Step-through pattern General Gait Details: generally steady pushing w/c, moderately dyspneic, EHR 123 bpm and SpO2 at 99% Gait velocity: slow Gait velocity interpretation: Below normal speed for age/gender    ADL: ADL Overall ADL's : Needs assistance/impaired Eating/Feeding: Independent, Sitting Grooming: Min  guard, Standing, Wash/dry hands Grooming Details (indicate cue type and reason): only able to tolerate one activity  Upper  Body Bathing: Set up, Sitting Lower Body Bathing: Maximal assistance, Sit to/from stand Upper Body Dressing : Set up, Sitting Lower Body Dressing: Maximal assistance, Sit to/from stand Toilet Transfer: Minimal assistance, Ambulation, RW Toileting- Clothing Manipulation and Hygiene: Minimal assistance, Sit to/from stand Functional mobility during ADLs: Minimal assistance, Rolling walker  Cognition: Cognition Overall Cognitive Status: Within Functional Limits for tasks assessed Orientation Level: Oriented X4 Cognition Arousal/Alertness: Awake/alert Behavior During Therapy: WFL for tasks assessed/performed Overall Cognitive Status: Within Functional Limits for tasks assessed  Blood pressure 116/66, pulse 74, temperature 98 F (36.7 C), temperature source Oral, resp. rate 16, height 6' (1.829 m), weight 150.1 kg (330 lb 14.6 oz), SpO2 93 %. Physical Exam  Constitutional: He is oriented to person, place, and time.  HENT:  Head: Normocephalic.  Eyes: EOM are normal.  Neck: Normal range of motion. Neck supple. No thyromegaly present.  Cardiovascular:  Cardiac rate controlled  Respiratory: Effort normal and breath sounds normal. No respiratory distress.  GI: Soft. Bowel sounds are normal. He exhibits no distension.  Neurological: He is alert and oriented to person, place, and time.  Skin:  Cellulitic changes lower extremities   4+ edema bilateral pretibial Motor strength is 5/5 bilateral deltoid, biceps, triceps, grip 3 minus in the hip flexors and knee extensors and ankle dorsiflexors. No results found for this or any previous visit (from the past 24 hour(s)). No results found.  Assessment/Plan: Diagnosis: Deconditioning after renal failure with severe bilateral lower extremity edema 1. Does the need for close, 24 hr/day medical supervision in concert with the  patient's rehab needs make it unreasonable for this patient to be served in a less intensive setting? Yes 2. Co-Morbidities requiring supervision/potential complications: Morbid obesity, atrial fibrillation with rapid ventricular response, renal failure, ileus 3. Due to bladder management, bowel management, safety, skin/wound care, disease management, medication administration, pain management and patient education, does the patient require 24 hr/day rehab nursing? Yes 4. Does the patient require coordinated care of a physician, rehab nurse, PT (11-2 hrs/day, 5 days/week) and OT (1-2 hrs/day, 5 days/week) to address physical and functional deficits in the context of the above medical diagnosis(es)? Yes Addressing deficits in the following areas: balance, endurance, locomotion, strength, transferring, bowel/bladder control, bathing, dressing, feeding, grooming, toileting and cognition 5. Can the patient actively participate in an intensive therapy program of at least 3 hrs of therapy per day at least 5 days per week? Yes 6. The potential for patient to make measurable gains while on inpatient rehab is excellent 7. Anticipated functional outcomes upon discharge from inpatient rehab are modified independent  with PT, modified independent with OT, n/a with SLP. 8. Estimated rehab length of stay to reach the above functional goals is:7 days 9. Does the patient have adequate social supports and living environment to accommodate these discharge functional goals? Potentially 10. Anticipated D/C setting: Home 11. Anticipated post D/C treatments: [place] therapy 12. Overall Rehab/Functional Prognosis: good  RECOMMENDATIONS: This patient's condition is appropriate for continued rehabilitative care in the following setting: CIR Patient has agreed to participate in recommended program. Yes Note that insurance prior authorization may be required for reimbursement for recommended care.  Comment: Patient is concerned  about his Medicaid pending status    05/17/2015

## 2015-05-17 NOTE — Progress Notes (Signed)
Inpatient Rehabilitation  Met with patient at bedside to discuss IP Rehab as a post acute rehab option.  Patient expressed concerns with payment and stated that he wants to wait to make a decision until he knows his Medicaid will pay for it.  However, patient reports that he has an appointment with the financial counselor today to start the application process.  Educated patient on the length of the process and informed him that he will likely need to make a post acute rehab decision prior to having a Medicaid decision.  Notified RN CM and CSW that alternative post acute rehab venue options may need to be discussed with patient.  Plan to follow up with patient tomorrow.    Carmelia Roller., CCC/SLP Admission Coordinator  [facility]  Cell [phone]

## 2015-05-17 NOTE — Progress Notes (Signed)
[facility] TEAM 1 - Stepdown/ICU TEAM PROGRESS NOTE  Scott Shelton [id number] DOB: 04/07/1951 DOA: 05/09/2015 PCP: No primary care provider on file.  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 About one year prior he developed small red punctate lesions to bilateral lower extremities from the calves down. These lesions began to spread and overwhelmed the entirety of the legs. Over a few weeks prior to his admit he developed DOE with simple tasks that would have been easy for him before.  1/31 he suffered a syncopal episode during which he fell back onto a couch into a seated position. He awoke spontaneously about 3-4 minutes later. He presented to ED where he was found to be hypotensive 94/80 and hypothermic 96.73F. Laboratory evaluation was significant for Creatinine 5.82, K 7.3, Na 109, Cl 79. Also had some mild leukocytosis with WBC 11.5.   HPI/Subjective: The patient states that he is feeling much better overall.  He denies chest pain shortness breath fevers chills nausea or vomiting.  He is tolerating his diet without significant difficulty.  Assessment/Plan:  Shock, presumed septic + hypovolemic Resolved - weaned off pressors 2/4   Severe sepsis secondary to RLE cellulitis Continues to improve - cont current abx for a planned 10 day treatment course - follow w/ ongoing wound care - no evidence of DVT on bilateral lower extremity venous duplex - cont current dressings - begin to ambulate - outpatient wound care will be quite challenging in the patient's lack of insurance coverage  Newly diagnosed Atrial Fib - transient amio started 2/4 - was noted to be back in NSR 2/6 morning - transitioned to oral amiodarone - suspect this was brought about by ARF and electrolyte abnormalities in the face of sepsis w/ pressor use - follow on tele an additional 24hrs - may not yet require  long term amio or anticoag   Acute renal failure Cr continues to improve - follow until normalized  Hyperkalemia > Hypokalemia Patient now needs potassium replacement - goal is 4.0    Hyponatremia Hypovolemic - resolved   Ileus CT abd 2/3 c/w ileus  - NGT now out - Gen Surgery has signed off - tolerating a regular diet  Anemia in of setting renal failure Hgb stable - avoid IV Fe load in setting of probable acute infection   Morbid obesity - Body mass index is 44.87 kg/(m^2).  MRSA screen +  Code Status: FULL Family Communication: no family present at time of exam Disposition Plan: SDU one additional night to monitor on tele - should be able to transfer to tele bed in AM  Consultants: Nephrology  PCCM Gen Surg  Antibiotics: Zosyn/Vanc 1/31  Rocephin 1/31 > 2/9  Procedures: TTE 2/1 > moderate LVH, EF 65-70%, normal wall-motion, normal LA, normal RV size and fxn B LE venous duplex 2/7 - no DVT   DVT prophylaxis: SQ heparin   Objective: Blood pressure 100/66, pulse 82, temperature 97.8 F (36.6 C), temperature source Oral, resp. rate 20, height 6' (1.829 m), weight 150.1 kg (330 lb 14.6 oz), SpO2 97 %.  Intake/Output Summary (Last 24 hours) at 05/17/15 1626 Last data filed at 05/17/15 1500  Gross per 24 hour  Intake 2190.3 ml  Output   1050 ml  Net 1140.3 ml   Exam: General: No acute respiratory distress - alert and pleasant  Lungs: Clear to auscultation bilaterally without wheezes or  crackles Cardiovascular: Regular rate and rhythm without murmur gallop or rub - distant HS  Abdomen: Nontender, obese, soft, bowel sounds positive, no rebound, no ascites, no appreciable mass Extremities: No significant cyanosis, or clubbing - 2+ B LE chronic appearing edema w/ venous stasis dermatitis and deep erythema - no purulent drainage   Data Reviewed: Basic Metabolic Panel:  Recent Labs Lab 05/14/15 0215 05/14/15 1850 05/15/15 0438 05/16/15 0508 05/17/15 0540    NA 135 135 138 135 136  K 3.8 3.6 3.5 3.2* 3.2*  CL 97* 95* 99* 95* 97*  CO2 29 27 28 28 29   GLUCOSE 95 94 88 91 96  BUN 34* 28* 26* 17 12  CREATININE 1.86* 1.76* 1.71* 1.53* 1.39*  CALCIUM 9.0 9.1 9.1 8.8* 9.3  MG  --   --  2.4  --  1.8  PHOS  --   --  2.7  --   --     CBC:  Recent Labs Lab 05/13/15 0415 05/14/15 0215 05/16/15 0508 05/17/15 0540  WBC 9.3 7.1 7.4 6.8  NEUTROABS  --   --   --  4.7  HGB 9.2* 8.6* 8.4* 8.8*  HCT 36.3* 26.5* 27.0* 27.0*  MCV 95.3 96.4 97.1 96.4  PLT 195 214 239 210    Liver Function Tests:  Recent Labs Lab 05/15/15 0438 05/16/15 0508  AST  --  19  ALT  --  14*  ALKPHOS  --  56  BILITOT  --  0.5  PROT  --  4.8*  ALBUMIN 2.3* 2.1*   CBG:  Recent Labs Lab 05/11/15 0756 05/11/15 1221 05/11/15 1917  GLUCAP 145* 123* 104*    Recent Results (from the past 240 hour(s))  Culture, blood (routine x 2)     Status: None   Collection Time: 05/09/15 10:00 AM  Result Value Ref Range Status   Specimen Description BLOOD LEFT ANTECUBITAL  Final   Special Requests BOTTLES DRAWN AEROBIC AND ANAEROBIC 5MLS  Final   Culture NO GROWTH 5 DAYS  Final   Report Status 05/14/2015 FINAL  Final  Culture, blood (routine x 2)     Status: None   Collection Time: 05/09/15 10:30 AM  Result Value Ref Range Status   Specimen Description BLOOD RIGHT ANTECUBITAL  Final   Special Requests BOTTLES DRAWN AEROBIC AND ANAEROBIC 10MLS  Final   Culture NO GROWTH 5 DAYS  Final   Report Status 05/14/2015 FINAL  Final  Urine culture     Status: None   Collection Time: 05/09/15  1:30 PM  Result Value Ref Range Status   Specimen Description URINE, CLEAN CATCH  Final   Special Requests NONE  Final   Culture MULTIPLE SPECIES PRESENT, SUGGEST RECOLLECTION  Final   Report Status 05/10/2015 FINAL  Final  MRSA PCR Screening     Status: Abnormal   Collection Time: 05/09/15  4:05 PM  Result Value Ref Range Status   MRSA by PCR POSITIVE (A) NEGATIVE Final    Comment:         The GeneXpert MRSA Assay (FDA approved for NASAL specimens only), is one component of a comprehensive MRSA colonization surveillance program. It is not intended to diagnose MRSA infection nor to guide or monitor treatment for MRSA infections. RESULT CALLED TO, READ BACK BY AND VERIFIED WITH: Bethel Born RN [id number] 05/09/15 A BROWNING      Studies:   Recent x-ray studies have been reviewed in detail by the Attending Physician  Scheduled Meds:  Scheduled  Meds: . amiodarone  200 mg Oral Daily  . antiseptic oral rinse  7 mL Mouth Rinse q12n4p  . cefTRIAXone (ROCEPHIN)  IV  2 g Intravenous Q24H  . chlorhexidine  15 mL Mouth Rinse BID  . Chlorhexidine Gluconate Cloth  6 each Topical Q0600  . docusate sodium  100 mg Oral Daily  . heparin  5,000 Units Subcutaneous 3 times per day  . hydrocerin   Topical Daily  . mupirocin ointment  1 application Nasal BID    Time spent on care of this patient: 35 mins   Tondra Reierson T , MD   [facility] Office  [phone] Pager - Text Page per Shea Evans as per below:  On-Call/Text Page:      [email or web address]      password [email or web address]  If 7PM-7AM, please contact night-coverage [email or web address] Password [email or web address] 05/17/2015, 4:26 PM   LOS: 8 days

## 2015-05-17 NOTE — Care Management Note (Signed)
Case Management Note  Patient Details  Name: Josiaha Pickman MRN: [id number] Date of Birth: Feb 16, 1951  Subjective/Objective:   PT/OT recommend CIR and pt has been accepted pending medical stability but has not decided if he wants to transfer to rehab as he has no insurance.  Discussed no PCP and pt requested assistance with finding same.  Provided information about [facility] and pt wants to follow with them.  Provided contact information and brochure - pt plans to call for appointment.  Also informed pt that he would qualify for [facility] when discharged.  Provided application for [facility] transportation.                         Expected Discharge Plan:  [place]  In-House Referral:  Development worker, community  Discharge planning Services  CM Consult  Status of Service:  In process, will continue to follow  Girard Cooter, RN 05/17/2015, 3:33 PM

## 2015-05-18 DIAGNOSIS — N179 Acute kidney failure, unspecified: Secondary | ICD-10-CM | POA: Diagnosis present

## 2015-05-18 DIAGNOSIS — I517 Cardiomegaly: Secondary | ICD-10-CM

## 2015-05-18 DIAGNOSIS — L03115 Cellulitis of right lower limb: Secondary | ICD-10-CM | POA: Diagnosis present

## 2015-05-18 LAB — BASIC METABOLIC PANEL
Anion gap: 7 (ref 5–15)
BUN: 10 mg/dL (ref 6–20)
CO2: 27 mmol/L (ref 22–32)
Calcium: 8.7 mg/dL — ABNORMAL LOW (ref 8.9–10.3)
Chloride: 105 mmol/L (ref 101–111)
Creatinine, Ser: 1.35 mg/dL — ABNORMAL HIGH (ref 0.61–1.24)
GFR calc Af Amer: >60 mL/min (ref >60)
GFR calc non Af Amer: 55 mL/min — ABNORMAL LOW (ref >60)
Glucose, Bld: 101 mg/dL — ABNORMAL HIGH (ref 65–99)
Potassium: 3.1 mmol/L — ABNORMAL LOW (ref 3.5–5.1)
Sodium: 139 mmol/L (ref 135–145)

## 2015-05-18 LAB — CBC WITH DIFFERENTIAL/PLATELET
Basophils Absolute: 0 10*3/uL (ref 0.0–0.1)
Basophils Relative: 0 %
Eosinophils Absolute: 0.1 10*3/uL (ref 0.0–0.7)
Eosinophils Relative: 2 %
HCT: 26.2 % — ABNORMAL LOW (ref 39.0–52.0)
Hemoglobin: 8.2 g/dL — ABNORMAL LOW (ref 13.0–17.0)
Lymphocytes Relative: 17 %
Lymphs Abs: 1.1 10*3/uL (ref 0.7–4.0)
MCH: 30.3 pg (ref 26.0–34.0)
MCHC: 31.3 g/dL (ref 30.0–36.0)
MCV: 96.7 fL (ref 78.0–100.0)
Monocytes Absolute: 0.7 10*3/uL (ref 0.1–1.0)
Monocytes Relative: 11 %
Neutro Abs: 4.3 10*3/uL (ref 1.7–7.7)
Neutrophils Relative %: 70 %
Platelets: 203 10*3/uL (ref 150–400)
RBC: 2.71 MIL/uL — ABNORMAL LOW (ref 4.22–5.81)
RDW: 13.9 % (ref 11.5–15.5)
WBC: 6.2 10*3/uL (ref 4.0–10.5)

## 2015-05-18 MED ORDER — POTASSIUM CHLORIDE CRYS ER 20 MEQ PO TBCR
40.0000 meq | EXTENDED_RELEASE_TABLET | Freq: Once | ORAL | Status: AC
  Administered 2015-05-18: 40 meq via ORAL
  Filled 2015-05-18: qty 2

## 2015-05-18 MED ORDER — ALPRAZOLAM 0.5 MG PO TABS
0.5000 mg | ORAL_TABLET | Freq: Once | ORAL | Status: AC
  Administered 2015-05-19: 0.5 mg via ORAL
  Filled 2015-05-18: qty 1

## 2015-05-18 NOTE — Progress Notes (Signed)
PT Cancellation Note  Patient Details Name: Scott Shelton MRN: [id number] DOB: September 06, 1950   Cancelled Treatment:    Reason Eval/Treat Not Completed: Patient declined, no reason specified. Will re-attempt as time permits.   Duncan Dull 05/18/2015, 11:56 AM Alben Deeds, PT DPT  [phone]

## 2015-05-18 NOTE — Progress Notes (Signed)
Eucerin cream applied to both legs and wrapped with kerlex and ace bandages.

## 2015-05-18 NOTE — Progress Notes (Signed)
CSW consulted by [facility] to discuss possible SNF after CIR if pt does not improve much.  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  CSW discussed potential options with patient and helped him work through current anxiety with being pressured to improve too much at CIR.  No further CSW needs at this time.  Domenica Reamer, [place] Social Worker [phone]

## 2015-05-18 NOTE — Progress Notes (Signed)
Attempted to see pt x2 this morning.  On both occasions, pt stated he was "taking care of business" and on the phone and wished for me to return later.  Two attempts made.  Will not return today but will check back as schedule allows.  Jinger Neighbors, [place] [id number]

## 2015-05-18 NOTE — Progress Notes (Signed)
Inpatient Rehabilitation  Met with patient and RN CM at bedside to discuss post acute rehab options.  Patient states that he has thought about it and would like to come to CIR for IP Rehab.  MD notified of change in plan.  Will follow along for medical readiness prior to admission.    [name] Bowie, M.A., CCC/SLP Admission Coordinator  [facility]  Cell [phone]  

## 2015-05-18 NOTE — Progress Notes (Signed)
Pt began discussion with writer about concerns that he was worried about after he will be discharged from facility. Pt states that he does not have a form of income at this moment due to family issue. (see Henrietta Mayo, RN note) Pt began to cry as we talked about his current situation. I then asked if he was currently having any suicidal ideations and pt stated that he has thought about suicide after being discharged because he does not feel like there is any other way out of his situation. I told the patient that I was going to call Dr and let them know of this change and that I would put in for consults to follow up with social work to help with the financial aspect of his situation. MD made aware of change.

## 2015-05-18 NOTE — Progress Notes (Signed)
[facility] TEAM 1 - Stepdown/ICU TEAM Progress Note  Scott Shelton [id number] DOB: 1950-06-03 DOA: 05/09/2015 PCP: No primary care provider on file.  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   About one year prior he developed small red punctate lesions to bilateral lower extremities from the calves down. These lesions began to spread and overwhelmed the entirety of the legs. Over a few weeks prior to his admit he developed DOE with simple tasks that would have been easy for him before. 1/31 he suffered a syncopal episode during which he fell back onto a couch into a seated position. He awoke spontaneously about 3-4 minutes later. He presented to ED where he was found to be hypotensive 94/80 and hypothermic 96.33F. Laboratory evaluation was significant for Creatinine 5.82, K 7.3, Na 109, Cl 79. Also had some mild leukocytosis with WBC 11.5.   HPI/Subjective: 2/9 A/O x 4, Negative SOB, N/V/D, negative leg pain. Negative Hx of A-Fib  Assessment/Plan:  Shock, presumed septic + hypovolemic -Resolved - weaned off pressors 2/4   Severe sepsis secondary to RLE cellulitis/  -Continues to improve - cont current abx - follow w/ ongoing wound care - given B nature, I doubt DVT as an etiology and suspect extent of edema would make venous duplex nearly useless - I will check a d-dimer - cont current dressings, Ambulate q shift -Bilateral lower extremities both have decreased in size however still too much edema for TED hose -Patient will require SNF however secondary to lack of insurance may not be feasible.  Newly diagnosed Atrial Fib/LVH -DC amiodarone drip  -Continue amiodarone PO 200 mg BID -Currently in NSR -Long-term anticoagulation; remains in NSR most likely will not require   Acute renal failure -cr improving with good UOP in post-ATN diuresis phase of his renal injury -  follow  -Strict in and out since admission +8.5 L -Cr continues to improve  Hypokalemia  -Potassium goal>4 -K-Dur 40 mEq  Hyponatremia -Hypovolemic - resolved   Ileus -CT abd 2/3 c/w ileus  - Gen Surgery following  -Resolved now on full liquids and tolerating well  Anemia in of setting renal failure -Hgb stable - avoid IV Fe load in setting of probable acute infection    Code Status: FULL Family Communication: no family present at time of exam Disposition Plan: Patient lacks insurance therefore CIR vs SNF may not be doable. NCM Henrietta working on this problem    Consultants: Dr.Matthew [place] CCS Dr.Jay Posey Pronto, Nephrology Dr.Robert S Byrum PCCM    Procedure/Significant Events: 2/1 Echocardiogram;- Left ventricle: moderate LVH. -LVEF= 65% to 70%.  2/7 bilateral LE Doppler;-No obvious DVT noted in visualized veins. However, calf veins were poorly visualized.    Culture   Antibiotics: Zosyn/Vanc 1/31  Rocephin 1/31 >>  DVT prophylaxis: Subcutaneous heparin   Devices NA   LINES / TUBES:  NA    Continuous Infusions: . sodium chloride 30 mL/hr at 05/18/15 0505    Objective: VITAL SIGNS: Temp: 97.7 F (36.5 C) (02/09 0502) Temp Source: Oral (02/09 0502) BP: 114/65 mmHg (02/09 0502) Pulse Rate: 76 (02/09 0502) SPO2; FIO2:   Intake/Output Summary (Last 24 hours) at 05/18/15 0947 Last data filed at 05/18/15 0630  Gross per 24 hour  Intake   1700 ml  Output   1000 ml  Net    700 ml     Exam: General:  A/O x 4, NAD, No acute respiratory distress Eyes: Negative headache, negative scleral hemorrhage ENT: Negative Runny nose, negative gingival bleeding, Neck:  Negative scars, masses, torticollis, lymphadenopathy, JVD Lungs: Clear to auscultation bilaterally without wheezes or crackles Cardiovascular: Regular rate and rhythm without murmur gallop or rub normal S1 and S2 Abdomen: Morbidly Obese, negative abdominal pain, nondistended,  positive soft, bowel sounds, no rebound, no ascites, no appreciable mass Extremities: Positive bilat LE cyanosis, Edema, Did not fully remove dressings  Psychiatric:  Negative depression, negative anxiety, negative fatigue, negative mania  Neurologic:  Cranial nerves II through XII intact, tongue/uvula midline, all extremities muscle strength 5/5, sensation intact throughout, negative dysarthria, negative expressive aphasia, negative receptive aphasia.   Data Reviewed: Basic Metabolic Panel:  Recent Labs Lab 05/14/15 1850 05/15/15 0438 05/16/15 0508 05/17/15 0540 05/18/15 0500  NA 135 138 135 136 139  K 3.6 3.5 3.2* 3.2* 3.1*  CL 95* 99* 95* 97* 105  CO2 27 28 28 29 27   GLUCOSE 94 88 91 96 101*  BUN 28* 26* 17 12 10   CREATININE 1.76* 1.71* 1.53* 1.39* 1.35*  CALCIUM 9.1 9.1 8.8* 9.3 8.7*  MG  --  2.4  --  1.8  --   PHOS  --  2.7  --   --   --    Liver Function Tests:  Recent Labs Lab 05/15/15 0438 05/16/15 0508  AST  --  19  ALT  --  14*  ALKPHOS  --  56  BILITOT  --  0.5  PROT  --  4.8*  ALBUMIN 2.3* 2.1*   No results for input(s): LIPASE, AMYLASE in the last 168 hours. No results for input(s): AMMONIA in the last 168 hours. CBC:  Recent Labs Lab 05/13/15 0415 05/14/15 0215 05/16/15 0508 05/17/15 0540 05/18/15 0500  WBC 9.3 7.1 7.4 6.8 6.2  NEUTROABS  --   --   --  4.7 4.3  HGB 9.2* 8.6* 8.4* 8.8* 8.2*  HCT 36.3* 26.5* 27.0* 27.0* 26.2*  MCV 95.3 96.4 97.1 96.4 96.7  PLT 195 214 239 210 203   Cardiac Enzymes: No results for input(s): CKTOTAL, CKMB, CKMBINDEX, TROPONINI in the last 168 hours. BNP (last 3 results) No results for input(s): BNP in the last 8760 hours.  ProBNP (last 3 results) No results for input(s): PROBNP in the last 8760 hours.  CBG:  Recent Labs Lab 05/11/15 1221 05/11/15 1917  GLUCAP 123* 104*    Recent Results (from the past 240 hour(s))  Culture, blood (routine x 2)     Status: None   Collection Time: 05/09/15 10:00 AM    Result Value Ref Range Status   Specimen Description BLOOD LEFT ANTECUBITAL  Final   Special Requests BOTTLES DRAWN AEROBIC AND ANAEROBIC 5MLS  Final   Culture NO GROWTH 5 DAYS  Final   Report Status 05/14/2015 FINAL  Final  Culture, blood (routine x 2)     Status: None   Collection Time: 05/09/15 10:30 AM  Result Value Ref Range Status   Specimen Description BLOOD RIGHT ANTECUBITAL  Final   Special Requests BOTTLES DRAWN AEROBIC AND ANAEROBIC 10MLS  Final   Culture NO GROWTH 5 DAYS  Final   Report Status 05/14/2015 FINAL  Final  Urine culture     Status: None   Collection Time: 05/09/15  1:30 PM  Result Value Ref Range Status   Specimen Description URINE, CLEAN CATCH  Final   Special Requests NONE  Final   Culture MULTIPLE SPECIES  PRESENT, SUGGEST RECOLLECTION  Final   Report Status 05/10/2015 FINAL  Final  MRSA PCR Screening     Status: Abnormal   Collection Time: 05/09/15  4:05 PM  Result Value Ref Range Status   MRSA by PCR POSITIVE (A) NEGATIVE Final    Comment:        The GeneXpert MRSA Assay (FDA approved for NASAL specimens only), is one component of a comprehensive MRSA colonization surveillance program. It is not intended to diagnose MRSA infection nor to guide or monitor treatment for MRSA infections. RESULT CALLED TO, READ BACK BY AND VERIFIED WITHBethel Born RN [id number] 05/09/15 A BROWNING      Studies:  Recent x-ray studies have been reviewed in detail by the Attending Physician  Scheduled Meds:  Scheduled Meds: . amiodarone  200 mg Oral Daily  . cefTRIAXone (ROCEPHIN)  IV  2 g Intravenous Q24H  . docusate sodium  100 mg Oral Daily  . heparin  5,000 Units Subcutaneous 3 times per day  . hydrocerin   Topical Daily  . mupirocin ointment  1 application Nasal BID  . potassium chloride  40 mEq Oral Once    Time spent on care of this patient: 40 mins   WOODS, Geraldo Docker , MD  [facility] Office  [phone] Pager -  [phone]  On-Call/Text Page:      [email or web address]      password [email or web address]  If 7PM-7AM, please contact night-coverage [email or web address] Password [email or web address] 05/18/2015, 9:47 AM   LOS: 9 days   Care during the described time interval was provided by me .  I have reviewed this patient's available data, including medical history, events of note, physical examination, and all test results as part of my evaluation. I have personally reviewed and interpreted all radiology studies.   Dia Crawford, MD [phone] Pager

## 2015-05-19 DIAGNOSIS — R45851 Suicidal ideations: Secondary | ICD-10-CM

## 2015-05-19 DIAGNOSIS — F4323 Adjustment disorder with mixed anxiety and depressed mood: Secondary | ICD-10-CM | POA: Clinically undetermined

## 2015-05-19 DIAGNOSIS — F322 Major depressive disorder, single episode, severe without psychotic features: Secondary | ICD-10-CM | POA: Clinically undetermined

## 2015-05-19 DIAGNOSIS — F4321 Adjustment disorder with depressed mood: Secondary | ICD-10-CM

## 2015-05-19 MED ORDER — PHENOL 1.4 % MT LIQD: 1.0000 | OROMUCOSAL | Status: DC | PRN

## 2015-05-19 MED ORDER — BISACODYL 10 MG RE SUPP: 10.0000 mg | Freq: Every day | RECTAL | Status: DC | PRN

## 2015-05-19 MED ORDER — FLUOXETINE HCL 20 MG PO CAPS
20.0000 mg | ORAL_CAPSULE | Freq: Every day | ORAL | Status: DC
  Administered 2015-05-19 – 2015-05-23 (×5): 20 mg via ORAL
  Filled 2015-05-19 (×5): qty 1

## 2015-05-19 MED ORDER — POTASSIUM CHLORIDE CRYS ER 20 MEQ PO TBCR
20.0000 meq | EXTENDED_RELEASE_TABLET | Freq: Three times a day (TID) | ORAL | Status: AC
  Administered 2015-05-19 – 2015-05-20 (×4): 20 meq via ORAL
  Filled 2015-05-19 (×4): qty 1

## 2015-05-19 MED ORDER — TRAZODONE HCL 50 MG PO TABS
50.0000 mg | ORAL_TABLET | Freq: Every day | ORAL | Status: DC
  Administered 2015-05-19 – 2015-05-22 (×4): 50 mg via ORAL
  Filled 2015-05-19 (×4): qty 1

## 2015-05-19 MED ORDER — FLUOXETINE HCL 20 MG PO CAPS: 20.0000 mg | ORAL_CAPSULE | Freq: Every day | ORAL | Status: DC

## 2015-05-19 MED ORDER — POTASSIUM CHLORIDE CRYS ER 20 MEQ PO TBCR: 20.0000 meq | EXTENDED_RELEASE_TABLET | Freq: Three times a day (TID) | ORAL | Status: DC

## 2015-05-19 MED ORDER — HEPARIN SODIUM (PORCINE) 5000 UNIT/ML IJ SOLN: 5000.0000 [IU] | Freq: Three times a day (TID) | INTRAMUSCULAR | Status: DC

## 2015-05-19 MED ORDER — TRAZODONE HCL 50 MG PO TABS: 50.0000 mg | ORAL_TABLET | Freq: Every day | ORAL | Status: DC

## 2015-05-19 MED ORDER — MUPIROCIN 2 % EX OINT: 1.0000 "application " | TOPICAL_OINTMENT | Freq: Two times a day (BID) | CUTANEOUS | Status: DC

## 2015-05-19 MED ORDER — ONDANSETRON 4 MG PO TBDP: 4.0000 mg | ORAL_TABLET | Freq: Three times a day (TID) | ORAL | Status: DC | PRN

## 2015-05-19 MED ORDER — DOCUSATE SODIUM 100 MG PO CAPS: 100.0000 mg | ORAL_CAPSULE | Freq: Every day | ORAL | Status: DC

## 2015-05-19 MED ORDER — AMIODARONE HCL 200 MG PO TABS: 200.0000 mg | ORAL_TABLET | Freq: Every day | ORAL | Status: DC

## 2015-05-19 MED ORDER — HYDROCERIN EX CREA: 1.0000 "application " | TOPICAL_CREAM | Freq: Every day | CUTANEOUS | Status: DC

## 2015-05-19 NOTE — Consult Note (Addendum)
[facility] Face-to-Face Psychiatry Consult   Reason for Consult:  Depression and suicide ideation Referring Physician:  Dr. Rockne Menghini Patient Identification: Scott Shelton MRN:  [id number] Principal Diagnosis: Adjustment disorder with mixed anxiety and depressed mood Diagnosis:   Patient Active Problem List   Diagnosis Date Noted  . MDD (major depressive disorder), single episode, severe , no psychosis ([place]) [F32.2] 05/19/2015  . Cellulitis of right lower extremity [L03.115]   . LVH (left ventricular hypertrophy) [I51.7]   . Acute renal failure ([place]) [N17.9]   . Septic shock ([place]) [A41.9, R65.21]   . Atrial fibrillation ([place]) [I48.91]   . Ileus ([place]) [K56.7]   . Hypokalemia [E87.6]   . ARF (acute renal failure) ([place]) [N17.9]   . AKI (acute kidney injury) ([place]) [N17.9] 05/09/2015  . Hyperkalemia [E87.5] 05/09/2015  . Lactic acidosis [E87.2] 05/09/2015  . Hypotension [I95.9] 05/09/2015  . Hypothermia [T68.XXXA] 05/09/2015  . Hyponatremia [E87.1] 05/09/2015  . Syncope [R55] 05/09/2015  . GERD (gastroesophageal reflux disease) [K21.9]   . Cellulitis [L03.90]   . Elevated troponin [R79.89]     Total Time spent with patient: 1 hour  Subjective:   Scott Shelton is a 65 y.o. male patient admitted with cellulitis of the legs.  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 Patient lost his health insurance before 2011 secondary to lost a job, reportedly affordable care Act insurance is not affordable for him. Patient older brother who is the power of attorney to his father has been involved with his care. Patient now feels he has no family or financial support and feels like he cannot go back to work. He has not been to do with his life except die.. Patient has no sphincter intention or  plans about how you want to die. Patient denies previous history of mental health condition at the depression, anxiety or substance abuse and also no history of suicidal homicidal ideations intentions or plans. Patient has no evidence of psychosis. Patient is willing to obtain medication for depression and sleep. Patient has been working with the case Metallurgist regarding his financial situation and family situation.   Past Psychiatric History: none reported.  Risk to Self: Is patient at risk for suicide?: No Risk to Others:   Prior Inpatient Therapy:   Prior Outpatient Therapy:    Past Medical History:  Past Medical History  Diagnosis Date  . GERD (gastroesophageal reflux disease)     Past Surgical History  Procedure Laterality Date  . Cataract extraction     Family History: No family history on file.   Family Psychiatric  History:He was took care ohis father since 2013 and his wife before 2011  Social History:  History  Alcohol Use No     History  Drug Use No    Social History   Social History  . Marital Status: Single    Spouse Name: N/A  . Number of Children: N/A  . Years of Education: N/A   Social History Main Topics  . Smoking status: Never Smoker   . Smokeless tobacco: None  . Alcohol Use: No  . Drug Use: No  . Sexual Activity: Not Asked   Other Topics Concern  . None   Social History Narrative  . None   Additional Social  History:    Allergies:  No Known Allergies  Labs:  Results for orders placed or performed during the hospital encounter of 05/09/15 (from the past 48 hour(s))  Basic metabolic panel     Status: Abnormal   Collection Time: 05/18/15  5:00 AM  Result Value Ref Range   Sodium 139 135 - 145 mmol/L   Potassium 3.1 (L) 3.5 - 5.1 mmol/L   Chloride 105 101 - 111 mmol/L   CO2 27 22 - 32 mmol/L   Glucose, Bld 101 (H) 65 - 99 mg/dL   BUN 10 6 - 20 mg/dL   Creatinine, Ser 8.93 (H) 0.61 - 1.24 mg/dL   Calcium 8.7 (L) 8.9 - 10.3  mg/dL   GFR calc non Af Amer 55 (L) >60 mL/min   GFR calc Af Amer >60 >60 mL/min    Comment: (NOTE) The eGFR has been calculated using the CKD EPI equation. This calculation has not been validated in all clinical situations. eGFR's persistently <60 mL/min signify possible Chronic Kidney Disease.    Anion gap 7 5 - 15  CBC with Differential/Platelet     Status: Abnormal   Collection Time: 05/18/15  5:00 AM  Result Value Ref Range   WBC 6.2 4.0 - 10.5 K/uL   RBC 2.71 (L) 4.22 - 5.81 MIL/uL   Hemoglobin 8.2 (L) 13.0 - 17.0 g/dL   HCT 81.0 (L) 17.5 - 10.2 %   MCV 96.7 78.0 - 100.0 fL   MCH 30.3 26.0 - 34.0 pg   MCHC 31.3 30.0 - 36.0 g/dL   RDW 58.5 27.7 - 82.4 %   Platelets 203 150 - 400 K/uL   Neutrophils Relative % 70 %   Neutro Abs 4.3 1.7 - 7.7 K/uL   Lymphocytes Relative 17 %   Lymphs Abs 1.1 0.7 - 4.0 K/uL   Monocytes Relative 11 %   Monocytes Absolute 0.7 0.1 - 1.0 K/uL   Eosinophils Relative 2 %   Eosinophils Absolute 0.1 0.0 - 0.7 K/uL   Basophils Relative 0 %   Basophils Absolute 0.0 0.0 - 0.1 K/uL    Current Facility-Administered Medications  Medication Dose Route Frequency Provider Last Rate Last Dose  . 0.9 %  sodium chloride infusion   Intravenous Continuous Lonia Blood, MD 30 mL/hr at 05/18/15 2058    . acetaminophen (TYLENOL) tablet 650 mg  650 mg Oral Q4H PRN Jeanella Craze, NP   650 mg at 05/19/15 0045  . alum & mag hydroxide-simeth (MAALOX/MYLANTA) 200-200-20 MG/5ML suspension 15 mL  15 mL Oral Q6H PRN Vishal Mungal, MD   15 mL at 05/11/15 1021  . amiodarone (PACERONE) tablet 200 mg  200 mg Oral Daily Drema Dallas, MD   200 mg at 05/18/15 1013  . bisacodyl (DULCOLAX) suppository 10 mg  10 mg Rectal Daily PRN Leslye Peer, MD   10 mg at 05/11/15 1541  . docusate sodium (COLACE) capsule 100 mg  100 mg Oral Daily Leslye Peer, MD   100 mg at 05/13/15 0944  . fentaNYL (SUBLIMAZE) injection 12.5-25 mcg  12.5-25 mcg Intravenous Q4H PRN Oretha Milch, MD    25 mcg at 05/10/15 2322  . heparin injection 5,000 Units  5,000 Units Subcutaneous 3 times per day Jeanella Craze, NP   5,000 Units at 05/19/15 [phone]  . hydrocerin (EUCERIN) cream   Topical Daily Leslye Peer, MD   1 application at 05/18/15 1018  . mupirocin ointment (BACTROBAN) 2 % 1  application  1 application Nasal BID Cherene Altes, MD   1 application at 11/88/67 2203  . ondansetron (ZOFRAN) injection 4 mg  4 mg Intravenous Q6H PRN Donita Brooks, NP   4 mg at 05/11/15 1332  . phenol (CHLORASEPTIC) mouth spray 1 spray  1 spray Mouth/Throat PRN Donnie Mesa, MD      . potassium chloride SA (K-DUR,KLOR-CON) CR tablet 20 mEq  20 mEq Oral TID Venetia Maxon Rama, MD        Musculoskeletal: Strength & Muscle Tone: within normal limits Gait & Station: unable to stand Patient leans: N/A  Psychiatric Specialty Exam: ROS leg swellings, denied nausea, vomiting, abdominal pain, shortness of breath or chest pain   Blood pressure 107/61, pulse 72, temperature 97.7 F (36.5 C), temperature source Oral, resp. rate 18, height 6' (1.829 m), weight 150.4 kg (331 lb 9.2 oz), SpO2 100 %.Body mass index is 44.96 kg/(m^2).  General Appearance: Casual, calm and cooperative   Eye Contact::  Good  Speech:  Clear and Coherent soft Dr.   Gardiner Barefoot:  Normal  Mood:  Depressed  Affect:  Appropriate and Congruent  Thought Process:  Coherent and Goal Directed  Orientation:  Full (Time, Place, and Person)  Thought Content:  Rumination  Suicidal Thoughts:  Yes.  without intent/plan  Homicidal Thoughts:  No  Memory:  Immediate;   Good Recent;   Good  Judgement:  Intact  Insight:  Fair  Psychomotor Activity:  Decreased  Concentration:  Good  Recall:  Good  Fund of Knowledge:Good  Language: Good  Akathisia:  Negative  Handed:  Right  AIMS (if indicated):     Assets:  Communication Skills Desire for Improvement Financial Resources/Insurance Housing Leisure Time Resilience Social  Support Talents/Skills Transportation Vocational/Educational  ADL's:  Impaired  Cognition: WNL  Sleep:      Treatment Plan Summary: Daily contact with patient to assess and evaluate symptoms and progress in treatment and Medication management  Patient contract for safety while in the hospital and has not required safety sitter Patient has no previous history of mental illness and no history of suicidal ideations or intentions or plans or behaviors. Patient is willing to take psychiatric medications as recommended Will start fluoxetine 20 mg daily for depression and anxiety and trazodone 50 mg at bedtime and for insomnia Patient is psychiatrically cleared to go to rehabilitation which is required physically  Disposition:  Patient does not meet criteria for psychiatric inpatient admission. Supportive therapy provided about ongoing stressors.  Durward Parcel., MD 05/19/2015 9:40 AM

## 2015-05-19 NOTE — Progress Notes (Signed)
Progress Note   Scott Shelton [id number] DOB: 1950-10-09 DOA: 05/09/2015 PCP: No primary care provider on file.   Brief Narrative:   Scott Shelton is an 65 y.o. male no significant PMH who was admitted 05/09/15 after suffering from a syncopal episode associated with hypoxia and hypothermia. Upon initial evaluation, he was found to have acute kidney injury with a creatinine of 5.82, hyperkalemia with a potassium of 7.3, hyponatremia with a sodium of 109 and mild leukocytosis with a WBC of 11.5. Of note, the patient also reported a one year history of a lower extremity eruption that had progressively worsened to involve the entirety of both legs.  Assessment/Plan:   Principal problems:  Shock, presumed septic + hypovolemic - Resolved - weaned off pressors 05/13/15.  Severe sepsis secondary to RLE cellulitis - Initially treated with Zosyn/vancomycin. Antibiotics narrowed to Rocephin 05/09/15. - Lower extremities evaluated by wound care nurse 05/12/15. Continue dressing changes per recommendations. - D-dimer is elevated at 3.82, lower extremity Dopplers negative for DVT but calf veins poorly visualized. - Blood cultures negative.  Active problems:  Newly diagnosed Atrial Fib/LVH - Continue amiodarone PO 200 mg BID. - Currently in NSR. - CHADS2Vasc score is 0-1, low risk.  Acute renal failure - Resolving. Serum creatinine has improved with hydration with his current GFR up to 85.  Depression with suicidal ideation  - Air cabin crew at bedside. - Seen by psychiatrist today. Prozac and trazodone started. - The patient continues to voice suicidal thoughts.  Hypokalemia  - Continue to supplement.  Hyponatremia - Hypovolemic - resolved.   Ileus - CT abd 05/12/15  c/w ileus.  - Gen Surgery following.  - Diet advanced to heart healthy 05/17/15.  Anemia in of setting renal failure - Hgb stable.     MRSA [place] - Continue decontamination therapy. Continue contact  isolation.  Morbid obesity - BMI 44.87 - Status post PT/OT evaluations. Evaluating for CIR. Lack of insurance may limit options.  DVT Prophylaxis - Heparin ordered.   Family Communication/Anticipated D/C date and plan/Code Status   Family Communication: No family at the bedside. Disposition Plan: May be limited with lack of insurance but CIR versus SNF recommended. Had been living with father. CIR was set to take the patient today, however the patient refused. We are now pursuing SNF options. Anticipated D/C date:   05/20/15 if SNF bed available. Code Status: Full code.   IV Access:    CVC left IJ placed 05/10/15   Procedures and diagnostic studies:   Dg Chest 2 View  05/09/2015  CLINICAL DATA:  Speech syncope, lightheadedness EXAM: CHEST  2 VIEW COMPARISON:  None. FINDINGS: There is mild elevation of the right diaphragm. There is no focal parenchymal opacity. There is no pleural effusion or pneumothorax. The heart and mediastinal contours are unremarkable. The osseous structures are unremarkable. IMPRESSION: No active cardiopulmonary disease. Electronically Signed   By: Kathreen Devoid   On: 05/09/2015 12:30   US Renal  05/09/2015  CLINICAL DATA:  Acute renal failure. EXAM: RENAL / URINARY TRACT ULTRASOUND COMPLETE COMPARISON:  None. FINDINGS: Right Kidney: Length: 11 cm. Echogenicity within normal limits. No mass or hydronephrosis visualized. Left Kidney: Length: 12.8 cm. Echogenicity within normal limits. No mass or hydronephrosis visualized. Bladder: Decompressed secondary to Foley catheter. IMPRESSION: No renal abnormality seen. Electronically Signed   By: Marijo Conception, M.D.   On: 05/09/2015 16:00   Dg Chest Port 1 View  05/10/2015  CLINICAL DATA:  Central line placement,  acute respiratory failure EXAM: PORTABLE CHEST 1 VIEW COMPARISON:  05/09/2015 FINDINGS: Cardiomediastinal silhouette is stable. No segmental infiltrate or pulmonary edema. Mild infrahilar atelectasis or bronchitic  changes. There is left IJ central line with tip in distal SVC. No pneumothorax. IMPRESSION: No infiltrate or pulmonary edema. No pneumothorax. Left IJ central line in place. Mild infrahilar atelectasis or bronchitic changes. Electronically Signed   By: Lahoma Crocker M.D.   On: 05/10/2015 14:24     Medical Consultants:    Dr. Louretta Shorten: Psychiatry  Dr.Matthew Donne Hazel: CCS  Dr.Jay Posey Pronto: Nephrology  Dr.Robert S Byrum: PCCM  Dr. Aretta Nip: Physical Medicine and Rehabilitation  Anti-Infectives:    Zosyn 05/09/15---> 05/09/15  Vancomycin 05/09/15---> 05/09/15  Rocephin 05/09/15 ---> 05/18/15    Subjective:   Scott Shelton sitting up in chair and is voicing angry thoughts about feeling pressured to "leave ". He does not want to go to CIR despite this being his best option. He is concerned about costs. He continues to voice suicidal thoughts. No complaints of nausea, vomiting or diarrhea at present.  Objective:    Filed Vitals:   05/18/15 1951 05/19/15 0000 05/19/15 0425 05/19/15 0737  BP: 121/69  108/66 107/61  Pulse: 85 76 64 72  Temp: 98.3 F (36.8 C)  98.7 F (37.1 C) 97.7 F (36.5 C)  TempSrc: Oral  Oral Oral  Resp: 21 20 17 18   Height:      Weight:      SpO2: 97% 97% 95% 100%    Intake/Output Summary (Last 24 hours) at 05/19/15 0747 Last data filed at 05/18/15 1900  Gross per 24 hour  Intake   1680 ml  Output      0 ml  Net   1680 ml   Filed Weights   05/16/15 2155 05/17/15 0500 05/18/15 0500  Weight: 149.7 kg (330 lb 0.5 oz) 150.1 kg (330 lb 14.6 oz) 150.4 kg (331 lb 9.2 oz)    Exam: Gen:  NAD Psych: Angry affect Cardiovascular:  RRR, No M/R/G Respiratory:  Lungs CTAB Gastrointestinal:  Abdomen soft, NT/ND, + BS Extremities:  Wrapped in [employer]   Data Reviewed:    Labs: Basic Metabolic Panel:  Recent Labs Lab 05/14/15 1850 05/15/15 0438 05/16/15 0508 05/17/15 0540 05/18/15 0500  NA 135 138 135 136 139  K 3.6 3.5 3.2* 3.2* 3.1*  CL 95*  99* 95* 97* 105  CO2 27 28 28 29 27   GLUCOSE 94 88 91 96 101*  BUN 28* 26* 17 12 10   CREATININE 1.76* 1.71* 1.53* 1.39* 1.35*  CALCIUM 9.1 9.1 8.8* 9.3 8.7*  MG  --  2.4  --  1.8  --   PHOS  --  2.7  --   --   --    GFR Estimated Creatinine Clearance: 85.6 mL/min (by C-G formula based on Cr of 1.35). Liver Function Tests:  Recent Labs Lab 05/15/15 0438 05/16/15 0508  AST  --  19  ALT  --  14*  ALKPHOS  --  56  BILITOT  --  0.5  PROT  --  4.8*  ALBUMIN 2.3* 2.1*   CBC:  Recent Labs Lab 05/13/15 0415 05/14/15 0215 05/16/15 0508 05/17/15 0540 05/18/15 0500  WBC 9.3 7.1 7.4 6.8 6.2  NEUTROABS  --   --   --  4.7 4.3  HGB 9.2* 8.6* 8.4* 8.8* 8.2*  HCT 36.3* 26.5* 27.0* 27.0* 26.2*  MCV 95.3 96.4 97.1 96.4 96.7  PLT 195 214 239 210 203  Microbiology Recent Results (from the past 240 hour(s))  Culture, blood (routine x 2)     Status: None   Collection Time: 05/09/15 10:00 AM  Result Value Ref Range Status   Specimen Description BLOOD LEFT ANTECUBITAL  Final   Special Requests BOTTLES DRAWN AEROBIC AND ANAEROBIC 5MLS  Final   Culture NO GROWTH 5 DAYS  Final   Report Status 05/14/2015 FINAL  Final  Culture, blood (routine x 2)     Status: None   Collection Time: 05/09/15 10:30 AM  Result Value Ref Range Status   Specimen Description BLOOD RIGHT ANTECUBITAL  Final   Special Requests BOTTLES DRAWN AEROBIC AND ANAEROBIC 10MLS  Final   Culture NO GROWTH 5 DAYS  Final   Report Status 05/14/2015 FINAL  Final  Urine culture     Status: None   Collection Time: 05/09/15  1:30 PM  Result Value Ref Range Status   Specimen Description URINE, CLEAN CATCH  Final   Special Requests NONE  Final   Culture MULTIPLE SPECIES PRESENT, SUGGEST RECOLLECTION  Final   Report Status 05/10/2015 FINAL  Final  MRSA PCR Screening     Status: Abnormal   Collection Time: 05/09/15  4:05 PM  Result Value Ref Range Status   MRSA by PCR POSITIVE (A) NEGATIVE Final    Comment:        The  GeneXpert MRSA Assay (FDA approved for NASAL specimens only), is one component of a comprehensive MRSA colonization surveillance program. It is not intended to diagnose MRSA infection nor to guide or monitor treatment for MRSA infections. RESULT CALLED TO, READ BACK BY AND VERIFIED WITHBethel Born RN [id number] 05/09/15 A BROWNING      Medications:   . amiodarone  200 mg Oral Daily  . docusate sodium  100 mg Oral Daily  . heparin  5,000 Units Subcutaneous 3 times per day  . hydrocerin   Topical Daily  . mupirocin ointment  1 application Nasal BID   Continuous Infusions: . sodium chloride 30 mL/hr at 05/18/15 2058    Time spent: 35 minutes with > 50% of time discussing current diagnostic test results, clinical impression and plan of care as well as discharge options.   LOS: 10 days   [facility] Pager [phone]. If unable to reach me by pager, please call my cell phone at [phone].  *Please refer to [email or web address], password [email or web address] to get updated schedule on who will round on this patient, as hospitalists switch teams weekly. If 7PM-7AM, please contact night-coverage at [email or web address], password [email or web address] for any overnight needs.  05/19/2015, 7:47 AM

## 2015-05-19 NOTE — Progress Notes (Addendum)
Inpatient Rehabilitation  Noted last night's events and that there is a suicide sitter at the bedside.  Will await psychiatric input.  Please call with questions.    Carmelia Roller., CCC/SLP Admission Coordinator  [place]  Cell [phone]

## 2015-05-19 NOTE — Progress Notes (Signed)
Physical Therapy Treatment Patient Details Name: Scott Shelton MRN: [id number] DOB: [birth year] Today's Date: 06/08/2015    History of Present Illness 65 year old male with no PMH. 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 1 year history of BLE edema and wounds. Suffered syncopal episode 1/31 and found to be hypotensive with acute renal failure, hypernatremia, hyperkalemia.    PT Comments    Pt making steady progress.  Follow Up Recommendations  CIR     Equipment Recommendations  Rolling walker with 5" wheels    Recommendations for Other Services       Precautions / Restrictions Precautions Precautions: Fall Precaution Comments: B LE wounds w/ wraps Restrictions Weight Bearing Restrictions: No    Mobility  Bed Mobility Overal bed mobility: Needs Assistance Bed Mobility: Supine to Sit     Supine to sit: Min guard     General bed mobility comments: For safety and lines  Transfers Overall transfer level: Needs assistance Equipment used: Rolling walker (2 wheeled) Transfers: Sit to/from Stand Sit to Stand: Min guard         General transfer comment: Pt pulled up on rolling walker despite cues to push from bed  Ambulation/Gait Ambulation/Gait assistance: Min assist Ambulation Distance (Feet): 115 Feet Assistive device: Rolling walker (2 wheeled) Gait Pattern/deviations: Step-through pattern;Decreased step length - right;Decreased step length - left;Shuffle;Trunk flexed;Wide base of support Gait velocity: slow Gait velocity interpretation: Below normal speed for age/gender General Gait Details: Assist for balance. Pt with dyspnea 2/4 with SpO2 98% on RA. HR <100   Stairs            Wheelchair Mobility    Modified Rankin (Stroke Patients Only)       Balance Overall balance assessment: Needs assistance Sitting-balance support: No upper extremity supported;Feet supported Sitting balance-Leahy Scale:  Good     Standing balance support: Bilateral upper extremity supported Standing balance-Leahy Scale: Poor Standing balance comment: Support of walker for static standing                    Cognition Arousal/Alertness: Awake/alert Behavior During Therapy: WFL for tasks assessed/performed Overall Cognitive Status: Within Functional Limits for tasks assessed                      Exercises      General Comments        Pertinent Vitals/Pain Pain Assessment: No/denies pain    Home Living                      Prior Function            PT Goals (current goals can now be found in the care plan section) Progress towards PT goals: Progressing toward goals    Frequency  Min 3X/week    PT Plan Current plan remains appropriate    Co-evaluation             End of Session Equipment Utilized During Treatment: Gait belt Activity Tolerance: Patient tolerated treatment well;Patient limited by fatigue Patient left: in chair;with call bell/phone within reach;with nursing/sitter in room     Time: 1155-1216 PT Time Calculation (min) (ACUTE ONLY): 21 min  Charges:  $Gait Training: 8-22 mins                    G Codes:      Scott Shelton 06/08/2015, 1:40 PM [facility] PT [phone]

## 2015-05-19 NOTE — Discharge Summary (Signed)
Physician Discharge Summary  [name] [id number] DOB: Oct 18, 1950 DOA: 05/09/2015  PCP: No primary care provider on file.  Admit date: 05/09/2015 Discharge date: 05/19/2015   Recommendations for Outpatient Follow-Up:   1. Recommend SW consultation for crisis counseling, ongoing outpatient psychiatric care.   Discharge Diagnosis:   Principal Problem:    Septic shock ([place]) Active Problems:    AKI (acute kidney injury) ([place])    Hyperkalemia    Lactic acidosis    Hypotension    Hypothermia    Hyponatremia    Syncope    Cellulitis    Elevated troponin    ARF (acute renal failure) ([facility])    Atrial fibrillation ([facility])    Ileus ([facility])    Hypokalemia    Cellulitis of right lower extremity    LVH (left ventricular hypertrophy)    Acute renal failure ([facility])    MDD (major depressive disorder), single episode, severe , no psychosis ([place])    Adjustment disorder with mixed anxiety and depressed mood    Suicidal thoughts   Discharge disposition:  CIR  Discharge Condition: Improved.  Diet recommendation: Low sodium, heart healthy.  Carbohydrate-modified.  Regular.  Wound care: Nonadherent with bismuth (Xeroform) to attempt to dry area and the antibacterial ointment. Continue to wrap legs with kerlix and ACE wraps from first metatarsal head to the patellar notch. Change daily.  History of Present Illness:   Scott Shelton is an 65 y.o. male no significant PMH who was admitted 05/09/15 after suffering from a syncopal episode associated with hypoxia and hypothermia. Upon initial evaluation, he was found to have acute kidney injury with a creatinine of 5.82, hyperkalemia with a potassium of 7.3, hyponatremia with a sodium of 109 and mild leukocytosis with a WBC of 11.5. Of note, the patient also reported a one year history of a lower extremity eruption that had progressively worsened to involve the entirety of both legs.  Hospital Course by Problem:   Shock,  presumed septic + hypovolemic - Resolved - weaned off pressors 05/13/15.  Severe sepsis secondary to RLE cellulitis - Initially treated with Zosyn/vancomycin. Antibiotics narrowed to Rocephin 05/09/15. Completed course 05/18/15. - Lower extremities evaluated by wound care nurse 05/12/15. Continue dressing changes per recommendations. - D-dimer is elevated at 3.82, lower extremity Dopplers negative for DVT but calf veins poorly visualized. - Blood cultures negative.   Newly diagnosed Atrial Fib/LVH - Continue amiodarone PO 200 mg BID. - Currently in NSR. - CHADS2Vasc score is 0-1, low risk.  Acute renal failure - Resolving. Serum creatinine has improved with hydration with his current GFR up to 85.  Depression with suicidal ideation  - Air cabin crew at bedside. - Seen by psychiatrist today. Prozac started. Not a candidate for inpatient psych treatment.  Hypokalemia  - Continue to supplement.  Hyponatremia - Hypovolemic - resolved.   Ileus - CT abd 05/12/15 c/w ileus. Resolved with NG tube decompression. - Gen Surgery evaluated 05/11/15.  - Diet advanced to heart healthy 05/17/15.  Anemia in of setting renal failure - Hgb stable.    MRSA Scott Shelton - Continue decontamination therapy. Continue contact isolation.  Morbid obesity - BMI 44.87 - Status post PT/OT evaluations. Evaluating for CIR. Lack of insurance may limit options.  DVT Prophylaxis - Heparin ordered.    Medical Consultants:    Psychiatry  Surgery  Cardiology  Nephrology   Discharge Exam:   Filed Vitals:   05/19/15 0737 05/19/15 1212  BP: 107/61 122/58  Pulse: 72 80  Temp: 97.7  F (36.5 C) 97.4 F (36.3 C)  Resp: 18 12   Filed Vitals:   05/19/15 0000 05/19/15 0425 05/19/15 0737 05/19/15 1212  BP:  108/66 107/61 122/58  Pulse: 76 64 72 80  Temp:  98.7 F (37.1 C) 97.7 F (36.5 C) 97.4 F (36.3 C)  TempSrc:  Oral Oral Oral  Resp: 20 17 18 12   Height:      Weight:      SpO2: 97% 95% 100%  100%    Gen:  NAD Psych: Angry affect Cardiovascular:  RRR, No M/R/G Respiratory: Lungs CTAB Gastrointestinal: Abdomen soft, NT/ND with normal active bowel sounds. Extremities: Unna boots intact   The results of significant diagnostics from this hospitalization (including imaging, microbiology, ancillary and laboratory) are listed below for reference.     Procedures and Diagnostic Studies:   Dg Chest 2 View  05/09/2015  CLINICAL DATA:  Speech syncope, lightheadedness EXAM: CHEST  2 VIEW COMPARISON:  None. FINDINGS: There is mild elevation of the right diaphragm. There is no focal parenchymal opacity. There is no pleural effusion or pneumothorax. The heart and mediastinal contours are unremarkable. The osseous structures are unremarkable. IMPRESSION: No active cardiopulmonary disease. Electronically Signed   By: Kathreen Devoid   On: 05/09/2015 12:30   US Renal  05/09/2015  CLINICAL DATA:  Acute renal failure. EXAM: RENAL / URINARY TRACT ULTRASOUND COMPLETE COMPARISON:  None. FINDINGS: Right Kidney: Length: 11 cm. Echogenicity within normal limits. No mass or hydronephrosis visualized. Left Kidney: Length: 12.8 cm. Echogenicity within normal limits. No mass or hydronephrosis visualized. Bladder: Decompressed secondary to Foley catheter. IMPRESSION: No renal abnormality seen. Electronically Signed   By: Marijo Conception, M.D.   On: 05/09/2015 16:00   Dg Chest Port 1 View  05/10/2015  CLINICAL DATA:  Central line placement, acute respiratory failure EXAM: PORTABLE CHEST 1 VIEW COMPARISON:  05/09/2015 FINDINGS: Cardiomediastinal silhouette is stable. No segmental infiltrate or pulmonary edema. Mild infrahilar atelectasis or bronchitic changes. There is left IJ central line with tip in distal SVC. No pneumothorax. IMPRESSION: No infiltrate or pulmonary edema. No pneumothorax. Left IJ central line in place. Mild infrahilar atelectasis or bronchitic changes. Electronically Signed   By: Lahoma Crocker M.D.    On: 05/10/2015 14:24     Labs:   Basic Metabolic Panel:  Recent Labs Lab 05/14/15 1850 05/15/15 0438 05/16/15 0508 05/17/15 0540 05/18/15 0500  NA 135 138 135 136 139  K 3.6 3.5 3.2* 3.2* 3.1*  CL 95* 99* 95* 97* 105  CO2 27 28 28 29 27   GLUCOSE 94 88 91 96 101*  BUN 28* 26* 17 12 10   CREATININE 1.76* 1.71* 1.53* 1.39* 1.35*  CALCIUM 9.1 9.1 8.8* 9.3 8.7*  MG  --  2.4  --  1.8  --   PHOS  --  2.7  --   --   --    GFR Estimated Creatinine Clearance: 85.6 mL/min (by C-G formula based on Cr of 1.35). Liver Function Tests:  Recent Labs Lab 05/15/15 0438 05/16/15 0508  AST  --  19  ALT  --  14*  ALKPHOS  --  56  BILITOT  --  0.5  PROT  --  4.8*  ALBUMIN 2.3* 2.1*   No results for input(s): LIPASE, AMYLASE in the last 168 hours. No results for input(s): AMMONIA in the last 168 hours. Coagulation profile No results for input(s): INR, PROTIME in the last 168 hours.  CBC:  Recent Labs Lab 05/13/15 0415  05/14/15 0215 05/16/15 0508 05/17/15 0540 05/18/15 0500  WBC 9.3 7.1 7.4 6.8 6.2  NEUTROABS  --   --   --  4.7 4.3  HGB 9.2* 8.6* 8.4* 8.8* 8.2*  HCT 36.3* 26.5* 27.0* 27.0* 26.2*  MCV 95.3 96.4 97.1 96.4 96.7  PLT 195 214 239 210 203   Cardiac Enzymes: No results for input(s): CKTOTAL, CKMB, CKMBINDEX, TROPONINI in the last 168 hours. BNP: Invalid input(s): POCBNP CBG: No results for input(s): GLUCAP in the last 168 hours. D-Dimer No results for input(s): DDIMER in the last 72 hours. Hgb A1c No results for input(s): HGBA1C in the last 72 hours. Lipid Profile No results for input(s): CHOL, HDL, LDLCALC, TRIG, CHOLHDL, LDLDIRECT in the last 72 hours. Thyroid function studies No results for input(s): TSH, T4TOTAL, T3FREE, THYROIDAB in the last 72 hours.  Invalid input(s): FREET3 Anemia work up No results for input(s): VITAMINB12, FOLATE, FERRITIN, TIBC, IRON, RETICCTPCT in the last 72 hours. Microbiology Recent Results (from the past 240 hour(s))    Urine culture     Status: None   Collection Time: 05/09/15  1:30 PM  Result Value Ref Range Status   Specimen Description URINE, CLEAN CATCH  Final   Special Requests NONE  Final   Culture MULTIPLE SPECIES PRESENT, SUGGEST RECOLLECTION  Final   Report Status 05/10/2015 FINAL  Final  MRSA PCR Screening     Status: Abnormal   Collection Time: 05/09/15  4:05 PM  Result Value Ref Range Status   MRSA by PCR POSITIVE (A) NEGATIVE Final    Comment:        The GeneXpert MRSA Assay (FDA approved for NASAL specimens only), is one component of a comprehensive MRSA colonization surveillance program. It is not intended to diagnose MRSA infection nor to guide or monitor treatment for MRSA infections. RESULT CALLED TO, READ BACK BY AND VERIFIED WITHBethel Born RN [id number] 05/09/15 A BROWNING      Discharge Instructions:       Discharge Instructions    Call MD for:  persistant nausea and vomiting    Complete by:  As directed      Call MD for:  severe uncontrolled pain    Complete by:  As directed      Call MD for:  temperature >100.4    Complete by:  As directed      Diet - low sodium heart healthy    Complete by:  As directed      Discharge wound care:    Complete by:  As directed   Per wound care RN recommendations.     Increase activity slowly    Complete by:  As directed             Medication List    TAKE these medications        aluminum-magnesium hydroxide-simethicone [id number] MG/5ML Susp  Commonly known as:  MAALOX  Take 30 mLs by mouth 3 (three) times daily as needed (acid reflux). Acid reflux     amiodarone 200 MG tablet  Commonly known as:  PACERONE  Take 1 tablet (200 mg total) by mouth daily.     bisacodyl 10 MG suppository  Commonly known as:  DULCOLAX  Place 1 suppository (10 mg total) rectally daily as needed for moderate constipation.     docusate sodium 100 MG capsule  Commonly known as:  COLACE  Take 1 capsule (100 mg total) by mouth daily.      FLUoxetine 20 MG capsule  Commonly known as:  PROZAC  Take 1 capsule (20 mg total) by mouth daily.     heparin 5000 UNIT/ML injection  Inject 1 mL (5,000 Units total) into the skin every 8 (eight) hours.     hydrocerin Crea  Apply 1 application topically daily.     mupirocin ointment 2 %  Commonly known as:  BACTROBAN  Place 1 application into the nose 2 (two) times daily.     ondansetron 4 MG disintegrating tablet  Commonly known as:  ZOFRAN ODT  Take 1 tablet (4 mg total) by mouth every 8 (eight) hours as needed for nausea or vomiting.     phenol 1.4 % Liqd  Commonly known as:  CHLORASEPTIC  Use as directed 1 spray in the mouth or throat as needed for throat irritation / pain.     potassium chloride SA 20 MEQ tablet  Commonly known as:  K-DUR,KLOR-CON  Take 1 tablet (20 mEq total) by mouth 3 (three) times daily.     traZODone 50 MG tablet  Commonly known as:  DESYREL  Take 1 tablet (50 mg total) by mouth at bedtime.          Time coordinating discharge: 35 minutes.  Signed:  Toneisha Savary  Pager [phone] [facility] 05/19/2015, 1:07 PM

## 2015-05-19 NOTE — Progress Notes (Signed)
OT NOTE  Pt with pending d/c to CIR today if patient accepts. Ot to hold treatment at this time.   Jeri Modena   OTR/L Pager: [phone] Office: [phone] .

## 2015-05-19 NOTE — Progress Notes (Signed)
Inpatient Rehabilitation  Met with patient who reported that he is feeling pressured to come to rehab and gives the impression that he no longer has an apartment for his discharge disposition.  I have discussed with Dr. Naaman Plummer and we feel the best disposition is now SNF.  I have alerted RN CM and CSW.  We will sign off.    Carmelia Roller., CCC/SLP Admission Coordinator  [facility]  Cell [phone]

## 2015-05-20 DIAGNOSIS — F322 Major depressive disorder, single episode, severe without psychotic features: Secondary | ICD-10-CM

## 2015-05-20 LAB — BASIC METABOLIC PANEL
ANION GAP: 6 (ref 5–15)
BUN: 8 mg/dL (ref 6–20)
CALCIUM: 9 mg/dL (ref 8.9–10.3)
CO2: 25 mmol/L (ref 22–32)
Chloride: 107 mmol/L (ref 101–111)
Creatinine, Ser: 1.24 mg/dL (ref 0.61–1.24)
GFR calc Af Amer: >60 mL/min (ref >60)
GLUCOSE: 103 mg/dL — AB (ref 65–99)
POTASSIUM: 3.6 mmol/L (ref 3.5–5.1)
SODIUM: 138 mmol/L (ref 135–145)

## 2015-05-20 MED ORDER — DOXYCYCLINE HYCLATE 100 MG PO TABS: 100.0000 mg | ORAL_TABLET | Freq: Two times a day (BID) | ORAL | Status: DC

## 2015-05-20 MED ORDER — ALPRAZOLAM 0.5 MG PO TABS
0.5000 mg | ORAL_TABLET | Freq: Once | ORAL | Status: AC
  Administered 2015-05-20: 0.5 mg via ORAL
  Filled 2015-05-20: qty 1

## 2015-05-20 NOTE — Progress Notes (Signed)
Progress Note   Even Polio [id number] DOB: 08-11-1950 DOA: 05/09/2015 PCP: No primary care provider on file.   Brief Narrative:   Scott Shelton is an 65 y.o. male no significant PMH who was admitted 05/09/15 after suffering from a syncopal episode associated with hypoxia and hypothermia. Upon initial evaluation, he was found to have acute kidney injury with a creatinine of 5.82, hyperkalemia with a potassium of 7.3, hyponatremia with a sodium of 109 and mild leukocytosis with a WBC of 11.5. Of note, the patient also reported a one year history of a lower extremity eruption that had progressively worsened to involve the entirety of both legs.  Assessment/Plan:   Principal problems:  Shock, presumed septic + hypovolemic - Resolved - weaned off pressors 05/13/15.  Severe sepsis secondary to RLE cellulitis - Initially treated with Zosyn/vancomycin. Antibiotics narrowed to Rocephin 05/09/15 and received Rocephin until 2-09.  -off antibiotics, will ask wound care consult to follow  - Lower extremities evaluated by wound care nurse 05/12/15. Continue dressing changes per recommendations. Will ask wound care to reevaluate.  - D-dimer is elevated at 3.82, lower extremity Dopplers negative for DVT but calf veins poorly visualized. - Blood cultures negative.  Newly diagnosed Atrial Fib/LVH - Continue amiodarone PO 200 mg BID. - Currently in NSR. - CHADS2Vasc score is 0-1, low risk.  Acute renal failure - Resolving. Serum creatinine has improved with hydration with his current GFR up to 85. -renal function stable.   Depression with suicidal ideation  - Air cabin crew at bedside. - Seen by psychiatrist today. Prozac and trazodone started. - The patient continues to voice suicidal thoughts. Was evaluated by Psych 2-10. Patient voice suicidal thought to nurse last night. Psych re consulted.   Hypokalemia  - Continue to supplement.  Hyponatremia - Hypovolemic - resolved.   Ileus -  CT abd 05/12/15  c/w ileus.  - Gen Surgery following.  - Diet advanced to heart healthy 05/17/15. Tolerating diet.   Anemia in of setting renal failure - Hgb stable.     MRSA [place] - Continue decontamination therapy. Continue contact isolation.  Morbid obesity - BMI 44.87 - Status post PT/OT evaluations. Evaluating for CIR. Lack of insurance may limit options.  DVT Prophylaxis - Heparin ordered.   Family Communication/Anticipated D/C date and plan/Code Status   Family Communication: No family at the bedside. Disposition Plan: May be limited with lack of insurance but CIR versus SNF recommended.  We are now pursuing SNF options. Transfer to regular bed.  Anticipated D/C date:  Awaiting SNF, psych consulted again.  Code Status: Full code.   IV Access:    CVC left IJ placed 05/10/15   Procedures and diagnostic studies:   Dg Chest 2 View  05/09/2015  CLINICAL DATA:  Speech syncope, lightheadedness EXAM: CHEST  2 VIEW COMPARISON:  None. FINDINGS: There is mild elevation of the right diaphragm. There is no focal parenchymal opacity. There is no pleural effusion or pneumothorax. The heart and mediastinal contours are unremarkable. The osseous structures are unremarkable. IMPRESSION: No active cardiopulmonary disease. Electronically Signed   By: Kathreen Devoid   On: 05/09/2015 12:30   US Renal  05/09/2015  CLINICAL DATA:  Acute renal failure. EXAM: RENAL / URINARY TRACT ULTRASOUND COMPLETE COMPARISON:  None. FINDINGS: Right Kidney: Length: 11 cm. Echogenicity within normal limits. No mass or hydronephrosis visualized. Left Kidney: Length: 12.8 cm. Echogenicity within normal limits. No mass or hydronephrosis visualized. Bladder: Decompressed secondary to Foley catheter. IMPRESSION: No renal  abnormality seen. Electronically Signed   By: Marijo Conception, M.D.   On: 05/09/2015 16:00   Dg Chest Port 1 View  05/10/2015  CLINICAL DATA:  Central line placement, acute respiratory failure EXAM:  PORTABLE CHEST 1 VIEW COMPARISON:  05/09/2015 FINDINGS: Cardiomediastinal silhouette is stable. No segmental infiltrate or pulmonary edema. Mild infrahilar atelectasis or bronchitic changes. There is left IJ central line with tip in distal SVC. No pneumothorax. IMPRESSION: No infiltrate or pulmonary edema. No pneumothorax. Left IJ central line in place. Mild infrahilar atelectasis or bronchitic changes. Electronically Signed   By: Lahoma Crocker M.D.   On: 05/10/2015 14:24     Medical Consultants:    Dr. Louretta Shorten: Psychiatry  Dr.Matthew Donne Hazel: CCS  Dr.Jay Posey Pronto: Nephrology  Dr.Robert S Byrum: PCCM  Dr. Aretta Nip: Physical Medicine and Rehabilitation  Anti-Infectives:    Zosyn 05/09/15---> 05/09/15  Vancomycin 05/09/15---> 05/09/15  Rocephin 05/09/15 ---> 05/18/15    Subjective:   Scott Shelton is considering CIR now, he is not sure if he will have where to go at discharge.    Objective:    Filed Vitals:   05/20/15 0300 05/20/15 0400 05/20/15 0430 05/20/15 0500  BP:  103/68    Pulse: 72 73  78  Temp:   98.3 F (36.8 C)   TempSrc:   Oral   Resp: 16 16  17   Height:      Weight:    150.9 kg (332 lb 10.8 oz)  SpO2: 97% 94%  96%    Intake/Output Summary (Last 24 hours) at 05/20/15 0941 Last data filed at 05/19/15 2239  Gross per 24 hour  Intake    480 ml  Output    150 ml  Net    330 ml   Filed Weights   05/17/15 0500 05/18/15 0500 05/20/15 0500  Weight: 150.1 kg (330 lb 14.6 oz) 150.4 kg (331 lb 9.2 oz) 150.9 kg (332 lb 10.8 oz)    Exam: Gen:  NAD Psych: Angry affect Cardiovascular:  RRR, No M/R/G Respiratory:  Lungs CTAB Gastrointestinal:  Abdomen soft, NT/ND, + BS Extremities:  Wrapped in [employer]   Data Reviewed:    Labs: Basic Metabolic Panel:  Recent Labs Lab 05/15/15 0438 05/16/15 0508 05/17/15 0540 05/18/15 0500 05/20/15 0815  NA 138 135 136 139 138  K 3.5 3.2* 3.2* 3.1* 3.6  CL 99* 95* 97* 105 107  CO2 28 28 29 27 25   GLUCOSE 88  91 96 101* 103*  BUN 26* 17 12 10 8   CREATININE 1.71* 1.53* 1.39* 1.35* 1.24  CALCIUM 9.1 8.8* 9.3 8.7* 9.0  MG 2.4  --  1.8  --   --   PHOS 2.7  --   --   --   --    GFR Estimated Creatinine Clearance: 93.4 mL/min (by C-G formula based on Cr of 1.24). Liver Function Tests:  Recent Labs Lab 05/15/15 0438 05/16/15 0508  AST  --  19  ALT  --  14*  ALKPHOS  --  56  BILITOT  --  0.5  PROT  --  4.8*  ALBUMIN 2.3* 2.1*   CBC:  Recent Labs Lab 05/14/15 0215 05/16/15 0508 05/17/15 0540 05/18/15 0500  WBC 7.1 7.4 6.8 6.2  NEUTROABS  --   --  4.7 4.3  HGB 8.6* 8.4* 8.8* 8.2*  HCT 26.5* 27.0* 27.0* 26.2*  MCV 96.4 97.1 96.4 96.7  PLT 214 239 210 203    Microbiology No results found for this  or any previous visit (from the past 240 hour(s)).   Medications:   . amiodarone  200 mg Oral Daily  . docusate sodium  100 mg Oral Daily  . FLUoxetine  20 mg Oral Daily  . heparin  5,000 Units Subcutaneous 3 times per day  . hydrocerin   Topical Daily  . mupirocin ointment  1 application Nasal BID  . potassium chloride  20 mEq Oral TID  . traZODone  50 mg Oral QHS   Continuous Infusions: . sodium chloride 30 mL/hr at 05/18/15 2058    Time spent: 35 minutes    LOS: 11 days   Niel Hummer A  [facility] Pager [phone].   Please refer to [email or web address], password [email or web address] to get updated schedule on who will round on this patient, as hospitalists switch teams weekly. If 7PM-7AM, please contact night-coverage at [email or web address], password [email or web address] for any overnight needs.  05/20/2015, 9:41 AM

## 2015-05-20 NOTE — Progress Notes (Signed)
Pt asked for this nurse to come in and speak to him about his current situation, frustrations, and concerns with his care. He confided in me that his financial situation dealing with his health concerns and his family. 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 He also stated that since he became sick and could not take care of his father that his brother stepped in to provide care but refuses to give or help out pt. Pt stated Thursday evening to this writer that he believed  that if he left he would end up on the streets or living out of his [place]. He then confessed that he had been contemplating the thought of suicide due to his situation. This evening pt continued to speak out about current situation that he believes will happen to him when he leaves. Pt states that he has been too focused on his situation that he will have to deal with outside of here that has kept him from focus on what's happening in front of him and that's why he may have seen so off put. Pt states that he is open to all ideas that are given to him but that he would like providers to understand the situation he currently feels he is in. This Probation officer thinks that this patient may benefit from an interdisciplinary meeting amongst providers.

## 2015-05-20 NOTE — Clinical Social Work Placement (Signed)
   CLINICAL SOCIAL WORK PLACEMENT  NOTE  Date:  05/20/2015  Patient Details  Name: Scott Shelton MRN: [id number] Date of Birth: [birth year]  Clinical Social Work is seeking post-discharge placement for this patient at the [place] level of care (*CSW will initial, date and re-position this form in  chart as items are completed):      Patient/family provided with [place] Work Department's list of facilities offering this level of care within the geographic area requested by the patient (or if unable, by the patient's family).      Patient/family informed of their freedom to choose among providers that offer the needed level of care, that participate in Medicare, Medicaid or managed care program needed by the patient, have an available bed and are willing to accept the patient.      Patient/family informed of [facility]'s ownership interest in [facility] and [facility], as well as of the fact that they are under no obligation to receive care at these facilities.  PASRR submitted to EDS on 05/20/15     PASRR number received on 05/20/15     Existing PASRR number confirmed on       FL2 transmitted to all facilities in geographic area requested by pt/family on 05/20/15     FL2 transmitted to all facilities within larger geographic area on 05/20/15     Patient informed that his/her managed care company has contracts with or will negotiate with certain facilities, including the following:            Patient/family informed of bed offers received.  Patient chooses bed at       Physician recommends and patient chooses bed at      Patient to be transferred to   on  .  Patient to be transferred to facility by       Patient family notified on   of transfer.  Name of family member notified:        PHYSICIAN       Additional Comment:    _______________________________________________ Matilde Bash, [place] 05/20/2015, 10:31  AM

## 2015-05-20 NOTE — Progress Notes (Signed)
05/20/2015 Patient right  central line was remove at 1305 it was held for 25 mins 4x4 and a vasaline gauze was apply to the site and pressure was apply. [facility] RN.

## 2015-05-20 NOTE — Clinical Social Work Note (Signed)
Clinical Social Work Assessment  Patient Details  Name: Scott Shelton MRN: [id number] Date of Birth: Aug 31, 1950  Date of referral:  05/20/15               Reason for consult:  Facility Placement                Permission sought to share information with:  Facility Art therapist granted to share information::  Yes, Verbal Permission Granted  Name::        Agency::     Relationship::  SNFs  Contact Information:     Housing/Transportation Living arrangements for the past 2 months:  Apartment Source of Information:  Patient Patient Interpreter Needed:  None Criminal Activity/Legal Involvement Pertinent to Current Situation/Hospitalization:  No - Comment as needed Significant Relationships:  Parents Lives with:  Parents Do you feel safe going back to the place where you live?  Yes Need for family participation in patient care:  No (Coment)  Care giving concerns:  Pt has no support system.   Social Worker assessment / plan:  Met with Pt to discuss d/c plan.  Pt stated that he had been caring for his elderly father for several years until recently.  Pt explained that he had neglected his health due to not being able to afford insurance and that he began to have serious issues with SOB and dizziness.  He was admitted into the hospital after his father was placed in a SNF locally.  Since that time, Pt's brothers have taken over his father's affairs and don't want to support Pt.  Pt stated that he has no where to go and has no money to support himself.  Pt is agreeable to SNF, as he recognizes the problems with his health, particularly his wounds.  He is agreeable to an extended[place]ounty search.  Employment status:  Retired Forensic scientist:  Self Pay (Medicaid Pending) PT Recommendations:  Not assessed at this time Information / Referral to community resources:     Patient/Family's Response to care:  Pt seemed ok with the care he is receiving.  Patient/Family's  Understanding of and Emotional Response to Diagnosis, Current Treatment, and Prognosis:  Pt feels that he needs continued medical care and welcomes SNF as an opportunity for him to get better.  Emotional Assessment Appearance:  Appears stated age Attitude/Demeanor/Rapport:   (calm) Affect (typically observed):  Accepting Orientation:  Oriented to Self, Oriented to Place, Oriented to  Time Alcohol / Substance use:  Never Used Psych involvement (Current and /or in the community):  Yes (Comment) (evaluated by psych in this stay)  Discharge Needs  Concerns to be addressed:  Lack of Support Readmission within the last 30 days:  No Current discharge risk:  Inadequate Financial Supports, Lack of support system Barriers to Discharge:  Family Issues   Scott Shelton, [place] 05/20/2015, 10:27 AM

## 2015-05-20 NOTE — Consult Note (Signed)
[facility] Face-to-Face Psychiatry Consult   Reason for Consult:  Depression and suicide ideation Referring Physician:  Dr. Darnelle Catalan Patient Identification: Scott Shelton MRN:  [id number] Principal Diagnosis: Septic shock [facility]) Diagnosis:   Patient Active Problem List   Diagnosis Date Noted  . MDD (major depressive disorder), single episode, severe , no psychosis (HCC) [F32.2] 05/19/2015  . Adjustment disorder with mixed anxiety and depressed mood [F43.23] 05/19/2015  . Cellulitis of right lower extremity [L03.115]   . LVH (left ventricular hypertrophy) [I51.7]   . Acute renal failure (HCC) [N17.9]   . Septic shock (HCC) [A41.9, R65.21]   . Atrial fibrillation (HCC) [I48.91]   . Ileus (HCC) [K56.7]   . Hypokalemia [E87.6]   . ARF (acute renal failure) (HCC) [N17.9]   . AKI (acute kidney injury) (HCC) [N17.9] 05/09/2015  . Hyperkalemia [E87.5] 05/09/2015  . Lactic acidosis [E87.2] 05/09/2015  . Hypotension [I95.9] 05/09/2015  . Hypothermia [T68.XXXA] 05/09/2015  . Hyponatremia [E87.1] 05/09/2015  . Syncope [R55] 05/09/2015  . GERD (gastroesophageal reflux disease) [K21.9]   . Cellulitis [L03.90]   . Elevated troponin [R79.89]     Total Time spent with patient: 30 min  Subjective:   Scott Shelton is a 65 y.o. male patient admitted with cellulitis of the legs.  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 Patient lost his health insurance before 2011 secondary to lost a job, reportedly affordable care Act insurance is not affordable for him. Patient older brother who is the power of attorney to his father has been involved with his care. Patient now feels he has no family or financial support and feels like he cannot go back to work. He has not been to do with his life except  die.. Patient has no sphincter intention or plans about how you want to die. Patient denies previous history of mental health condition at the depression, anxiety or substance abuse and also no history of suicidal homicidal ideations intentions or plans. Patient has no evidence of psychosis. Patient is willing to obtain medication for depression and sleep. Patient has been working with the case Research officer, political party regarding his financial situation and family situation.  Patient seen again in follow-up today on 05/20/2015. His situation was reviewed in detail he still feels somewhat hopeless about his future although better than he did yesterday. He states that financially "my back is up against a wall." He doesn't know where he is going from here. His legs are healing from cellulitis and he is being transferred to the floor today. He knows that Child psychotherapist is working on getting him a placement in a skilled nursing facility. On Monday he has an interview on the phone with Social Security to get his benefits. He states that these things don't work out he may become suicidal again but he is not suicidal today. He slept well with the addition of trazodone and I explained to him that Prozac will take some time to work but we will continue to check on his situation. He denies active suicidal plan or intent today   Past Psychiatric History: none reported.  Risk to Self: Is patient at risk for suicide?: No Risk to Others:   Prior Inpatient Therapy:   Prior Outpatient Therapy:    Past Medical History:  Past Medical History  Diagnosis Date  . GERD (gastroesophageal reflux disease)     Past Surgical History  Procedure Laterality Date  . Cataract extraction     Family History: No family history on file.   Family Psychiatric  History:He was took care ohis father since 2013 and his wife before 2011  Social History:  History  Alcohol Use No     History  Drug Use No    Social History   Social  History  . Marital Status: Single    Spouse Name: N/A  . Number of Children: N/A  . Years of Education: N/A   Social History Main Topics  . Smoking status: Never Smoker   . Smokeless tobacco: None  . Alcohol Use: No  . Drug Use: No  . Sexual Activity: Not Asked   Other Topics Concern  . None   Social History Narrative  . None   Additional Social History:    Allergies:  No Known Allergies  Labs:  Results for orders placed or performed during the hospital encounter of 05/09/15 (from the past 48 hour(s))  Basic metabolic panel     Status: Abnormal   Collection Time: 05/20/15  8:15 AM  Result Value Ref Range   Sodium 138 135 - 145 mmol/L   Potassium 3.6 3.5 - 5.1 mmol/L   Chloride 107 101 - 111 mmol/L   CO2 25 22 - 32 mmol/L   Glucose, Bld 103 (H) 65 - 99 mg/dL   BUN 8 6 - 20 mg/dL   Creatinine, Ser 1.24 0.61 - 1.24 mg/dL   Calcium 9.0 8.9 - 10.3 mg/dL   GFR calc non Af Amer >60 >60 mL/min   GFR calc Af Amer >60 >60 mL/min    Comment: (NOTE) The eGFR has been calculated using the CKD EPI equation. This calculation has not been validated in all clinical situations. eGFR's persistently <60 mL/min signify possible Chronic Kidney Disease.    Anion gap 6 5 - 15    Current Facility-Administered Medications  Medication Dose Route Frequency Provider Last Rate Last Dose  . acetaminophen (TYLENOL) tablet 650 mg  650 mg Oral Q4H PRN Donita Brooks, NP   650 mg at 05/19/15 0045  . alum & mag hydroxide-simeth (MAALOX/MYLANTA) 200-200-20 MG/5ML suspension 15 mL  15 mL Oral Q6H PRN Vishal Mungal, MD   15 mL at 05/11/15 1021  . amiodarone (PACERONE) tablet 200 mg  200 mg Oral Daily Allie Bossier, MD   200 mg at 05/20/15 1026  . bisacodyl (DULCOLAX) suppository 10 mg  10 mg Rectal Daily PRN Collene Gobble, MD   10 mg at 05/11/15 1541  . docusate sodium (COLACE) capsule 100 mg  100 mg Oral Daily Collene Gobble, MD   100 mg at 05/13/15 0944  . fentaNYL (SUBLIMAZE) injection 12.5-25  mcg  12.5-25 mcg Intravenous Q4H PRN Rigoberto Noel, MD   25 mcg at 05/10/15 2322  . FLUoxetine (PROZAC) capsule 20 mg  20 mg Oral Daily Ambrose Finland, MD   20 mg at 05/20/15 1026  . heparin injection 5,000 Units  5,000 Units Subcutaneous 3 times per day Donita Brooks, NP   5,000 Units at 05/20/15 0539  . hydrocerin (EUCERIN) cream   Topical Daily Collene Gobble, MD      . mupirocin ointment (BACTROBAN) 2 % 1 application  1 application Nasal BID Cherene Altes, MD   1 application at 28/36/62 1028  . ondansetron (ZOFRAN) injection  4 mg  4 mg Intravenous Q6H PRN Donita Brooks, NP   4 mg at 05/11/15 1332  . phenol (CHLORASEPTIC) mouth spray 1 spray  1 spray Mouth/Throat PRN Donnie Mesa, MD      . traZODone (DESYREL) tablet 50 mg  50 mg Oral QHS Ambrose Finland, MD   50 mg at 05/19/15 2248    Musculoskeletal: Strength & Muscle Tone: within normal limits Gait & Station: unable to stand Patient leans: N/A  Psychiatric Specialty Exam: ROS leg swellings, denied nausea, vomiting, abdominal pain, shortness of breath or chest pain   Blood pressure 103/68, pulse 78, temperature 98.3 F (36.8 C), temperature source Oral, resp. rate 17, height 6' (1.829 m), weight 150.9 kg (332 lb 10.8 oz), SpO2 96 %.Body mass index is 45.11 kg/(m^2).  General Appearance: Casual, calm and cooperative   Eye Contact::  Good  Speech:  Clear and Coherent soft  Volume:  Normal  Mood:  Depressed  Affect:  Appropriate and Congruent  Thought Process:  Coherent and Goal Directed  Orientation:  Full (Time, Place, and Person)  Thought Content:  Rumination  Suicidal Thoughts:  No but states he might become so again if he does not get a placement or his finances do not get worked out   Homicidal Thoughts:  No  Memory:  Immediate;   Good Recent;   Good  Judgement:  Intact  Insight:  Fair  Psychomotor Activity:  Decreased  Concentration:  Good  Recall:  Good  Fund of Knowledge:Good  Language: Good   Akathisia:  Negative  Handed:  Right  AIMS (if indicated):     Assets:  Communication Skills Desire for Improvement Financial Resources/Insurance Housing Leisure Time Resilience Social Support Talents/Skills Transportation Vocational/Educational  ADL's:  Impaired  Cognition: WNL  Sleep:      Treatment Plan Summary: Daily contact with patient to assess and evaluate symptoms and progress in treatment and Medication management  Patient contract for safety right now recommending continuing the sitter due to his fluctuation and thought about suicidality Patient has no previous history of mental illness and no history of suicidal ideations or intentions or plans or behaviors. Patient is willing to take psychiatric medications as recommended Will continue fluoxetine 20 mg daily for depression and anxiety and trazodone 50 mg at bedtime and for insomnia   Disposition:  Patient does not meet criteria for psychiatric inpatient admission. Supportive therapy provided about ongoing stressors.  Levonne Spiller, MD 05/20/2015 12:58 PM

## 2015-05-20 NOTE — Progress Notes (Signed)
Scott Shelton [id number]:[id number]  Transfer Data: 05/20/2015 2:17 PM  Attending Provider: Elmarie Shiley, MD  PCP:No primary care provider on file.  Code Status: Full  Scott Shelton is a 65 y.o. male patient transferred from [place]  -No acute distress noted.  -No complaints of shortness of breath.  -No complaints of chest pain.  Cardiac Monitoring:  Box # 14 in place.  Cardiac monitor yields:normal sinus rhythm.  Blood pressure 103/68, pulse 78, temperature 98.3 F (36.8 C), temperature source Oral, resp. rate 17, height 6' (1.829 m), weight 150.9 kg (332 lb 10.8 oz), SpO2 96 %.  ?  IV Fluids: IV in place, occlusive dsg intact without redness, IV cath forearm right, condition patent and no redness  none.  Allergies: Review of patient's allergies indicates no known allergies.  Past Medical History:  has a past medical history of GERD (gastroesophageal reflux disease).  Past Surgical History:  has past surgical history that includes Cataract extraction.  Social History:  reports that he has never smoked. He does not have any smokeless tobacco history on file. He reports that he does not drink alcohol or use illicit drugs.    Patient/Family orientated to room. Information packet given to patient/family. Admission inpatient armband information verified with patient/family to include name and date of birth and placed on patient arm. Side rails up x 2, fall assessment and education completed with patient/family. Patient/family able to verbalize understanding of risk associated with falls and verbalized understanding to call for assistance before getting out of bed. Call light within reach. Patient/family able to voice and demonstrate understanding of unit orientation instructions.  Will continue to evaluate and treat per MD orders.

## 2015-05-20 NOTE — NC FL2 (Signed)
[place] LEVEL OF CARE SCREENING TOOL     IDENTIFICATION  Patient Name: Scott Shelton Birthdate: 1950/06/27 Sex: male Admission Date (Current Location): 05/09/2015  [facility] and [place] Number:  Herbalist and Address:  [facility] [place], [place] [address] [place], [place] [address]      Provider Number: [id number]  Attending Physician Name and Address:  Elmarie Shiley, MD  Relative Name and Phone Number:       Current Level of Care: Hospital Recommended Level of Care: [place] Prior Approval Number:    Date Approved/Denied:   PASRR Number: [id number]  Discharge Plan: SNF    Current Diagnoses: Patient Active Problem List   Diagnosis Date Noted  . MDD (major depressive disorder), single episode, severe , no psychosis ([place]) 05/19/2015  . Adjustment disorder with mixed anxiety and depressed mood 05/19/2015  . Cellulitis of right lower extremity   . LVH (left ventricular hypertrophy)   . Acute renal failure ([place])   . Septic shock ([place])   . Atrial fibrillation ([place])   . Ileus ([place])   . Hypokalemia   . ARF (acute renal failure) ([place])   . AKI (acute kidney injury) ([place]) 05/09/2015  . Hyperkalemia 05/09/2015  . Lactic acidosis 05/09/2015  . Hypotension 05/09/2015  . Hypothermia 05/09/2015  . Hyponatremia 05/09/2015  . Syncope 05/09/2015  . GERD (gastroesophageal reflux disease)   . Cellulitis   . Elevated troponin     Orientation RESPIRATION BLADDER Height & Weight     Self, Time, Situation  Normal Continent Weight: (!) 332 lb 10.8 oz (150.9 kg) Height:  6' (182.9 cm)  BEHAVIORAL SYMPTOMS/MOOD NEUROLOGICAL BOWEL NUTRITION STATUS      Continent  (Heart healthy)  AMBULATORY STATUS COMMUNICATION OF NEEDS Skin   Limited Assist Verbally Other (Comment), PU Stage and Appropriate Care (Will add nonadherent with bismuth (Xeroform) to attempt to dry area and the antibacterial will be helpful as well. Continue to  wrap legs with kerlix and ACE wraps from first metatarsal head to the patellar notch.)   PU Stage 2 Dressing: Daily                   Personal Care Assistance Level of Assistance  Dressing     Dressing Assistance: Limited assistance     Functional Limitations Info             SPECIAL CARE FACTORS FREQUENCY                       Contractures      Additional Factors Info  Isolation Precautions         Isolation Precautions Info: MRSA     Current Medications (05/20/2015):  This is the current hospital active medication list Current Facility-Administered Medications  Medication Dose Route Frequency Provider Last Rate Last Dose  . acetaminophen (TYLENOL) tablet 650 mg  650 mg Oral Q4H PRN Donita Brooks, NP   650 mg at 05/19/15 0045  . alum & mag hydroxide-simeth (MAALOX/MYLANTA) 200-200-20 MG/5ML suspension 15 mL  15 mL Oral Q6H PRN Vishal Mungal, MD   15 mL at 05/11/15 1021  . amiodarone (PACERONE) tablet 200 mg  200 mg Oral Daily Allie Bossier, MD   200 mg at 05/20/15 1026  . bisacodyl (DULCOLAX) suppository 10 mg  10 mg Rectal Daily PRN Collene Gobble, MD   10 mg at 05/11/15 1541  . docusate sodium (COLACE)  capsule 100 mg  100 mg Oral Daily Collene Gobble, MD   100 mg at 05/13/15 0944  . fentaNYL (SUBLIMAZE) injection 12.5-25 mcg  12.5-25 mcg Intravenous Q4H PRN Rigoberto Noel, MD   25 mcg at 05/10/15 2322  . FLUoxetine (PROZAC) capsule 20 mg  20 mg Oral Daily Ambrose Finland, MD   20 mg at 05/20/15 1026  . heparin injection 5,000 Units  5,000 Units Subcutaneous 3 times per day Donita Brooks, NP   5,000 Units at 05/20/15 0539  . hydrocerin (EUCERIN) cream   Topical Daily Collene Gobble, MD      . mupirocin ointment (BACTROBAN) 2 % 1 application  1 application Nasal BID Cherene Altes, MD   1 application at [id number]  . ondansetron (ZOFRAN) injection 4 mg  4 mg Intravenous Q6H PRN Donita Brooks, NP   4 mg at 05/11/15 1332  . phenol (CHLORASEPTIC)  mouth spray 1 spray  1 spray Mouth/Throat PRN Donnie Mesa, MD      . traZODone (DESYREL) tablet 50 mg  50 mg Oral QHS Ambrose Finland, MD   50 mg at 05/19/15 2248     Discharge Medications: Please see discharge summary for a list of discharge medications.  Relevant Imaging Results:  Relevant Lab Results:   Additional Information    Moshe Cipro Berneice Heinrich, LCSW

## 2015-05-21 DIAGNOSIS — Z872 Personal history of diseases of the skin and subcutaneous tissue: Secondary | ICD-10-CM

## 2015-05-21 HISTORY — DX: Personal history of diseases of the skin and subcutaneous tissue: Z87.2

## 2015-05-21 LAB — CBC
HCT: 26.9 % — ABNORMAL LOW (ref 39.0–52.0)
Hemoglobin: 8.2 g/dL — ABNORMAL LOW (ref 13.0–17.0)
MCH: 29.9 pg (ref 26.0–34.0)
MCHC: 30.5 g/dL (ref 30.0–36.0)
MCV: 98.2 fL (ref 78.0–100.0)
PLATELETS: 203 10*3/uL (ref 150–400)
RBC: 2.74 MIL/uL — ABNORMAL LOW (ref 4.22–5.81)
RDW: 14.7 % (ref 11.5–15.5)
WBC: 5.4 10*3/uL (ref 4.0–10.5)

## 2015-05-21 LAB — BASIC METABOLIC PANEL
ANION GAP: 10 (ref 5–15)
BUN: 10 mg/dL (ref 6–20)
CALCIUM: 9.3 mg/dL (ref 8.9–10.3)
CO2: 22 mmol/L (ref 22–32)
Chloride: 110 mmol/L (ref 101–111)
Creatinine, Ser: 1.27 mg/dL — ABNORMAL HIGH (ref 0.61–1.24)
GFR calc Af Amer: >60 mL/min (ref >60)
GFR, EST NON AFRICAN AMERICAN: 59 mL/min — AB (ref >60)
GLUCOSE: 96 mg/dL (ref 65–99)
Potassium: 3.9 mmol/L (ref 3.5–5.1)
Sodium: 142 mmol/L (ref 135–145)

## 2015-05-21 NOTE — Consult Note (Signed)
[facility] Consult   Reason for Consult:  Depression and suicide ideation Referring Physician:  Dr. Rockne Menghini Patient Identification: Scott Shelton MRN:  [id number] Principal Diagnosis: Septic shock [facility]) Diagnosis:   Patient Active Problem List   Diagnosis Date Noted  . MDD (major depressive disorder), single episode, severe , no psychosis ([place]) [F32.2] 05/19/2015  . Adjustment disorder with mixed anxiety and depressed mood [F43.23] 05/19/2015  . Cellulitis of right lower extremity [L03.115]   . LVH (left ventricular hypertrophy) [I51.7]   . Acute renal failure ([place]) [N17.9]   . Septic shock ([place]) [A41.9, R65.21]   . Atrial fibrillation ([place]) [I48.91]   . Ileus ([place]) [K56.7]   . Hypokalemia [E87.6]   . ARF (acute renal failure) ([place]) [N17.9]   . AKI (acute kidney injury) ([place]) [N17.9] 05/09/2015  . Hyperkalemia [E87.5] 05/09/2015  . Lactic acidosis [E87.2] 05/09/2015  . Hypotension [I95.9] 05/09/2015  . Hypothermia [T68.XXXA] 05/09/2015  . Hyponatremia [E87.1] 05/09/2015  . Syncope [R55] 05/09/2015  . GERD (gastroesophageal reflux disease) [K21.9]   . Cellulitis [L03.90]   . Elevated troponin [R79.89]     Total Time spent with patient: 30 min  Subjective:   Scott Shelton is a 65 y.o. male patient admitted with cellulitis of the legs.  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 Patient lost his health insurance before 2011 secondary to lost a job, reportedly affordable care Act insurance is not affordable for him. Patient older brother who is the power of attorney to his father has been involved with his care. Patient now feels he has no family or financial support and feels like he cannot go back to work. He has not been to do with his life  except die.. Patient has no sphincter intention or plans about how you want to die. Patient denies previous history of mental health condition at the depression, anxiety or substance abuse and also no history of suicidal homicidal ideations intentions or plans. Patient has no evidence of psychosis. Patient is willing to obtain medication for depression and sleep. Patient has been working with the case Metallurgist regarding his financial situation and family situation.  Patient seen again in follow-up today on 05/20/2015. His situation was reviewed in detail he still feels somewhat hopeless about his future although better than he did yesterday. He states that financially "my back is up against a wall." He doesn't know where he is going from here. His legs are healing from cellulitis and he is being transferred to the floor today. He knows that Education officer, museum is working on getting him a placement in a skilled nursing facility. On Monday he has an interview on the phone with Social Security to get his benefits. He states that these things don't work out he may become suicidal again but he is not suicidal today. He slept well with the addition of trazodone and I explained to him that Prozac will take some time to work but we will continue to check on his situation. He denies active suicidal plan or intent today  Patient seen again today on 05/21/15. He states that he is feeling better. He slept well last night. His mood has improved and he denies suicidal ideation. He is feeling better about going to a  SNF.   Past Psychiatric History: none reported.  Risk to Self: Is patient at risk for suicide?: No Risk to Others:   Prior Inpatient Therapy:   Prior Outpatient Therapy:    Past Medical History:  Past Medical History  Diagnosis Date  . GERD (gastroesophageal reflux disease)     Past Surgical History  Procedure Laterality Date  . Cataract extraction     Family History: No family history on  file.   Family Psychiatric  History:He was took care ohis father since 2013 and his wife before 2011  Social History:  History  Alcohol Use No     History  Drug Use No    Social History   Social History  . Marital Status: Single    Spouse Name: N/A  . Number of Children: N/A  . Years of Education: N/A   Social History Main Topics  . Smoking status: Never Smoker   . Smokeless tobacco: None  . Alcohol Use: No  . Drug Use: No  . Sexual Activity: Not Asked   Other Topics Concern  . None   Social History Narrative  . None   Additional Social History:    Allergies:  No Known Allergies  Labs:  Results for orders placed or performed during the hospital encounter of 05/09/15 (from the past 48 hour(s))  Basic metabolic panel     Status: Abnormal   Collection Time: 05/20/15  8:15 AM  Result Value Ref Range   Sodium 138 135 - 145 mmol/L   Potassium 3.6 3.5 - 5.1 mmol/L   Chloride 107 101 - 111 mmol/L   CO2 25 22 - 32 mmol/L   Glucose, Bld 103 (H) 65 - 99 mg/dL   BUN 8 6 - 20 mg/dL   Creatinine, Ser 1.24 0.61 - 1.24 mg/dL   Calcium 9.0 8.9 - 10.3 mg/dL   GFR calc non Af Amer >60 >60 mL/min   GFR calc Af Amer >60 >60 mL/min    Comment: (NOTE) The eGFR has been calculated using the CKD EPI equation. This calculation has not been validated in all clinical situations. eGFR's persistently <60 mL/min signify possible Chronic Kidney Disease.    Anion gap 6 5 - 15  CBC     Status: Abnormal   Collection Time: 05/21/15  3:45 AM  Result Value Ref Range   WBC 5.4 4.0 - 10.5 K/uL   RBC 2.74 (L) 4.22 - 5.81 MIL/uL   Hemoglobin 8.2 (L) 13.0 - 17.0 g/dL   HCT 26.9 (L) 39.0 - 52.0 %   MCV 98.2 78.0 - 100.0 fL   MCH 29.9 26.0 - 34.0 pg   MCHC 30.5 30.0 - 36.0 g/dL   RDW 14.7 11.5 - 15.5 %   Platelets 203 150 - 400 K/uL  Basic metabolic panel     Status: Abnormal   Collection Time: 05/21/15  3:45 AM  Result Value Ref Range   Sodium 142 135 - 145 mmol/L   Potassium 3.9 3.5 -  5.1 mmol/L   Chloride 110 101 - 111 mmol/L   CO2 22 22 - 32 mmol/L   Glucose, Bld 96 65 - 99 mg/dL   BUN 10 6 - 20 mg/dL   Creatinine, Ser 1.27 (H) 0.61 - 1.24 mg/dL   Calcium 9.3 8.9 - 10.3 mg/dL   GFR calc non Af Amer 59 (L) >60 mL/min   GFR calc Af Amer >60 >60 mL/min    Comment: (NOTE) The eGFR has been calculated using the  CKD EPI equation. This calculation has not been validated in all clinical situations. eGFR's persistently <60 mL/min signify possible Chronic Kidney Disease.    Anion gap 10 5 - 15    Current Facility-Administered Medications  Medication Dose Route Frequency Provider Last Rate Last Dose  . acetaminophen (TYLENOL) tablet 650 mg  650 mg Oral Q4H PRN Donita Brooks, NP   650 mg at 05/19/15 0045  . alum & mag hydroxide-simeth (MAALOX/MYLANTA) 200-200-20 MG/5ML suspension 15 mL  15 mL Oral Q6H PRN Vishal Mungal, MD   15 mL at 05/11/15 1021  . amiodarone (PACERONE) tablet 200 mg  200 mg Oral Daily Allie Bossier, MD   200 mg at 05/21/15 1610  . bisacodyl (DULCOLAX) suppository 10 mg  10 mg Rectal Daily PRN Collene Gobble, MD   10 mg at 05/11/15 1541  . docusate sodium (COLACE) capsule 100 mg  100 mg Oral Daily Collene Gobble, MD   100 mg at 05/13/15 0944  . fentaNYL (SUBLIMAZE) injection 12.5-25 mcg  12.5-25 mcg Intravenous Q4H PRN Rigoberto Noel, MD   25 mcg at 05/10/15 2322  . FLUoxetine (PROZAC) capsule 20 mg  20 mg Oral Daily Ambrose Finland, MD   20 mg at 05/21/15 9604  . heparin injection 5,000 Units  5,000 Units Subcutaneous 3 times per day Donita Brooks, NP   5,000 Units at 05/21/15 0604  . hydrocerin (EUCERIN) cream   Topical Daily Collene Gobble, MD      . mupirocin ointment (BACTROBAN) 2 % 1 application  1 application Nasal BID Cherene Altes, MD   1 application at 54/09/81 [phone]  . ondansetron (ZOFRAN) injection 4 mg  4 mg Intravenous Q6H PRN Donita Brooks, NP   4 mg at 05/11/15 1332  . phenol (CHLORASEPTIC) mouth spray 1 spray  1 spray  Mouth/Throat PRN Donnie Mesa, MD      . traZODone (DESYREL) tablet 50 mg  50 mg Oral QHS Ambrose Finland, MD   50 mg at 05/20/15 2134    Musculoskeletal: Strength & Muscle Tone: within normal limits Gait & Station: unable to stand Patient leans: N/A  Psychiatric Specialty Exam: ROS leg swellings, denied nausea, vomiting, abdominal pain, shortness of breath or chest pain   Blood pressure 117/57, pulse 72, temperature 98 F (36.7 C), temperature source Oral, resp. rate 20, height 6' (1.829 m), weight 150.9 kg (332 lb 10.8 oz), SpO2 97 %.Body mass index is 45.11 kg/(m^2).  General Appearance: Casual, calm and cooperative   Eye Contact::  Good  Speech:  Clear and Coherent soft  Volume:  Normal  Mood:  Fairly good  Affect:  Appropriate and Congruent  Thought Process:  Coherent and Goal Directed  Orientation:  Full (Time, Place, and Person)  Thought Content:  Rumination  Suicidal Thoughts:  No  Homicidal Thoughts:  No  Memory:  Immediate;   Good Recent;   Good  Judgement:  Intact  Insight:  Fair  Psychomotor Activity:  Decreased  Concentration:  Good  Recall:  Good  Fund of Knowledge:Good  Language: Good  Akathisia:  Negative  Handed:  Right  AIMS (if indicated):     Assets:  Communication Skills Desire for Improvement Financial Resources/Insurance Housing Leisure Time Resilience Social Support Talents/Skills Transportation Vocational/Educational  ADL's:  Impaired  Cognition: WNL  Sleep:      Treatment Plan Summary: Daily contact with patient to assess and evaluate symptoms and progress in treatment and Medication management  Patient contract  for safety  And denies suicidal ideation, he no longer needs a sitter Patient has no previous history of mental illness and no history of suicidal ideations or intentions or plans or behaviors. Patient is willing to take psychiatric medications as recommended Will continue fluoxetine 20 mg daily for depression and  anxiety and trazodone 50 mg at bedtime and for insomnia   Disposition:  Patient does not meet criteria for psychiatric inpatient admission. Supportive therapy provided about ongoing stressors.  Levonne Spiller, MD 05/21/2015 4:03 PM

## 2015-05-21 NOTE — Progress Notes (Addendum)
SW made the following attempt for SNF placement today:  [facility]--declined [facility]--declined [facility]--declined [facility]--left a message [facility] a message [facility]--left a message  Pt to transfer to 5W.    Bernita Raisin, [place] Social Work [phone]

## 2015-05-21 NOTE — Progress Notes (Signed)
PROGRESS NOTE  Scott Shelton [id number] DOB: 26-Nov-1950 DOA: 05/09/2015 PCP: No primary care provider on file.  HPI: Scott Shelton is an 65 y.o. male no significant PMH who was admitted 05/09/15 after suffering from a syncopal episode associated with hypoxia and hypothermia. Upon initial evaluation, he was found to have acute kidney injury with a creatinine of 5.82, hyperkalemia with a potassium of 7.3, hyponatremia with a sodium of 109 and mild leukocytosis with a WBC of 11.5. Of note, the patient also reported a one year history of a lower extremity eruption that had progressively worsened to involve the entirety of both legs.  Subjective / 24 H Interval events - Patient watching the TV, doesn't allow me to turn it down because he is "watching" - Denies any chest pain or shortness of breath but answers my questions minimally without further elaboration  Assessment/Plan: Principal Problem:   Septic shock ([place]) Active Problems:   AKI (acute kidney injury) ([place])   Hyperkalemia   Lactic acidosis   Hypotension   Hypothermia   Hyponatremia   Syncope   Cellulitis   Elevated troponin   ARF (acute renal failure) ([facility])   Atrial fibrillation ([facility])   Ileus ([facility])   Hypokalemia   Cellulitis of right lower extremity   LVH (left ventricular hypertrophy)   Acute renal failure ([facility])   MDD (major depressive disorder), single episode, severe , no psychosis ([place])   Adjustment disorder with mixed anxiety and depressed mood    Shock, presumed septic + hypovolemic - Resolved - weaned off pressors 05/13/15.  Severe sepsis secondary to RLE cellulitis - Initially treated with Zosyn/vancomycin. Antibiotics narrowed to Rocephin 05/09/15 and received Rocephin until 2-09.  - off antibiotics, will ask wound care consult to follow  - Lower extremities evaluated by wound care nurse 05/12/15. Continue dressing changes per recommendations. Will ask wound care to reevaluate.  - D-dimer is elevated at 3.82,  lower extremity Dopplers negative for DVT but calf veins poorly visualized. - Blood cultures negative.  Newly diagnosed Atrial Fib/LVH - Continue amiodarone PO 200 mg BID. - Currently in NSR. - CHADS2Vasc score is 0-1, low risk.  Acute renal failure - Resolving. Serum creatinine has improved with hydration with his current GFR up to 85. -renal function stable.   Depression with suicidal ideation  - Air cabin crew at bedside. - discussed with Dr. Harrington Challenger today. As long as patient needs a Air cabin crew he could not go to skilled nursing facility and may need inpatient psychiatry.  Hypokalemia  - Continue to supplement.  Hyponatremia - Hypovolemic - resolved.   Ileus - CT abd 05/12/15 c/w ileus.  - Gen Surgery following.  - Diet advanced to heart healthy 05/17/15. Tolerating diet.   Anemia in of setting renal failure - Hgb stable.    [name] - Continue decontamination therapy. Continue contact isolation.  Morbid obesity - BMI 44.87 - Status post PT/OT evaluations. Evaluating for CIR. Lack of insurance may limit options.    Diet: Diet Heart Room service appropriate?: Yes; Fluid consistency:: Thin Diet - low sodium heart healthy Fluids: none DVT Prophylaxis: heparin  Code Status: Full Code Family Communication: no family bedside  Disposition Plan: SNF when ready  Barriers to discharge: psych eval  Consultants:  Psychiatry   Surgery  Nephrology  Pulmonology  Procedures:  None    Antibiotics  Zosyn 05/09/15---> 05/09/15  Vancomycin 05/09/15---> 05/09/15  Rocephin 05/09/15 ---> 05/18/15   Studies  No results found.  Objective  Filed Vitals:  05/20/15 0800 05/20/15 1000 05/20/15 2108 05/21/15 0450  BP:   122/49 102/53  Pulse: 76 76 80 75  Temp:   98.7 F (37.1 C) 98.8 F (37.1 C)  TempSrc:   Oral Oral  Resp: 21 18 18 19   Height:      Weight:      SpO2: 98% 97% 98% 100%    Intake/Output Summary (Last 24 hours) at 05/21/15 1159 Last  data filed at 05/21/15 0900  Gross per 24 hour  Intake    700 ml  Output      0 ml  Net    700 ml   Filed Weights   05/17/15 0500 05/18/15 0500 05/20/15 0500  Weight: 150.1 kg (330 lb 14.6 oz) 150.4 kg (331 lb 9.2 oz) 150.9 kg (332 lb 10.8 oz)    Exam:  GENERAL: NAD  HEENT: no scleral icterus, PERRL  NECK: supple, no LAD  LUNGS: CTA biL, no wheezing  HEART: RRR without MRG  ABDOMEN: soft, non tender  EXTREMITIES: no clubbing / cyanosis  NEUROLOGIC: non focal  PSYCHIATRIC: normal mood and affect  SKIN: no rashes  Data Reviewed: Basic Metabolic Panel:  Recent Labs Lab 05/15/15 0438 05/16/15 0508 05/17/15 0540 05/18/15 0500 05/20/15 0815 05/21/15 0345  NA 138 135 136 139 138 142  K 3.5 3.2* 3.2* 3.1* 3.6 3.9  CL 99* 95* 97* 105 107 110  CO2 28 28 29 27 25 22   GLUCOSE 88 91 96 101* 103* 96  BUN 26* 17 12 10 8 10   CREATININE 1.71* 1.53* 1.39* 1.35* 1.24 1.27*  CALCIUM 9.1 8.8* 9.3 8.7* 9.0 9.3  MG 2.4  --  1.8  --   --   --   PHOS 2.7  --   --   --   --   --    Liver Function Tests:  Recent Labs Lab 05/15/15 0438 05/16/15 0508  AST  --  19  ALT  --  14*  ALKPHOS  --  56  BILITOT  --  0.5  PROT  --  4.8*  ALBUMIN 2.3* 2.1*   No results for input(s): LIPASE, AMYLASE in the last 168 hours. No results for input(s): AMMONIA in the last 168 hours. CBC:  Recent Labs Lab 05/16/15 0508 05/17/15 0540 05/18/15 0500 05/21/15 0345  WBC 7.4 6.8 6.2 5.4  NEUTROABS  --  4.7 4.3  --   HGB 8.4* 8.8* 8.2* 8.2*  HCT 27.0* 27.0* 26.2* 26.9*  MCV 97.1 96.4 96.7 98.2  PLT 239 210 203 203   Cardiac Enzymes: No results for input(s): CKTOTAL, CKMB, CKMBINDEX, TROPONINI in the last 168 hours. BNP (last 3 results) No results for input(s): BNP in the last 8760 hours.  ProBNP (last 3 results) No results for input(s): PROBNP in the last 8760 hours.  CBG: No results for input(s): GLUCAP in the last 168 hours.  No results found for this or any previous visit  (from the past 240 hour(s)).   Scheduled Meds: . amiodarone  200 mg Oral Daily  . docusate sodium  100 mg Oral Daily  . FLUoxetine  20 mg Oral Daily  . heparin  5,000 Units Subcutaneous 3 times per day  . hydrocerin   Topical Daily  . mupirocin ointment  1 application Nasal BID  . traZODone  50 mg Oral QHS   Continuous Infusions:   Scott Board, MD [facility] Pager [phone]. If 7 PM - 7 AM, please contact night-coverage at [email or web address], password [facility]  05/21/2015, 11:59 AM  LOS: 12 days

## 2015-05-21 NOTE — Progress Notes (Signed)
Pt refused to ambulate as ordered, will continue to monitor

## 2015-05-22 NOTE — Progress Notes (Signed)
Occupational Therapy Treatment Patient Details Name: Scott Shelton MRN: [id number] DOB: May 23, 1950 Today's Date: 05/22/2015    History of present illness 65 year old male with no PMH. 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 1 year history of BLE edema and wounds. Suffered syncopal episode 1/31 and found to be hypotensive with acute renal failure, hypernatremia, hyperkalemia.   OT comments  Pt progressing. Education provided in session. Updated d/c recommendation to SNF. It pt goes home, recommend HHOT.   Follow Up Recommendations  SNF    Equipment Recommendations  Other (comment) (AE)    Recommendations for Other Services      Precautions / Restrictions Precautions Precautions: Fall Precaution Comments: B LE wounds with wraps Restrictions Weight Bearing Restrictions: No       Mobility Bed Mobility Overal bed mobility: Modified Independent Bed Mobility: Supine to Sit;Sit to Supine     Supine to sit: Modified independent (Device/Increase time) Sit to supine: Modified independent (Device/Increase time)      Transfers Overall transfer level: Needs assistance Equipment used: Rolling walker (2 wheeled)   Sit to Stand: Supervision         General transfer comment: cues for hand placement/technique    Balance    Min guard given for ambulation with RW.                               ADL Overall ADL's : Needs assistance/impaired Eating/Feeding: Sitting;Independent Eating/Feeding Details (indicate cue type and reason): drank water Grooming: Applying deodorant;Set up;Supervision/safety;Standing;Sitting;Oral care;Brushing hair (washed face)    Upper Body Bathing: washed his hair with Min assist at sink.   Lower Body Bathing: Set up;Supervison/ safety;Sit to/from stand     Upper Body Dressing Details (indicate cue type and reason): OT assisted in managing back gown, but pt also able to assist. Lower  Body Dressing: Total assistance;Bed level Lower Body Dressing Details (indicate cue type and reason): donning/doffing socks Toilet Transfer: Min guard;Ambulation;RW;BSC (larger size BSC)           Functional mobility during ADLs: Rolling walker;Min guard General ADL Comments: Educated on safety and energy conservation techniques. discussed AE. Recommended someone be with him for bathing.Recommended elevating LEs.      Vision                     Perception     Praxis      Cognition  Awake/Alert Behavior During Therapy: WFL for tasks assessed/performed Overall Cognitive Status: Within Functional Limits for tasks assessed                       Extremity/Trunk Assessment               Exercises     Shoulder Instructions       General Comments      Pertinent Vitals/ Pain       Pain Assessment: 0-10 Pain Score:  (0-1) Pain Location: LEs Pain Intervention(s): Monitored during session  Home Living                                          Prior Functioning/Environment              Frequency Min 2X/week     Progress Toward  Goals  OT Goals(current goals can now be found in the care plan section)  Progress towards OT goals: Progressing toward goals  Acute Rehab OT Goals Patient Stated Goal: to wash my hair OT Goal Formulation: With patient Time For Goal Achievement: 05/30/15 Potential to Achieve Goals: Good ADL Goals Pt Will Perform Grooming: with modified independence;standing (3 activities) Pt Will Perform Lower Body Bathing: with modified independence;with adaptive equipment;sit to/from stand Pt Will Perform Lower Body Dressing: with modified independence;with adaptive equipment;sit to/from stand Pt Will Transfer to Toilet: with modified independence;ambulating Pt Will Perform Toileting - Clothing Manipulation and hygiene: with modified independence;sit to/from stand Pt Will Perform Tub/Shower Transfer: Tub  transfer;with modified independence;ambulating;shower seat Additional ADL Goal #1: Pt will generalize energy conservation strategies in ADL independently.  Plan Discharge plan remains appropriate    Co-evaluation                 End of Session Equipment Utilized During Treatment: Gait belt;Rolling walker   Activity Tolerance Patient tolerated treatment well   Patient Left in bed;with call bell/phone within reach;with family/visitor present   Nurse Communication          Time: [id number] OT Time Calculation (min): 33 min  Charges: OT General Charges $OT Visit: 1 Procedure OT Treatments $Self Care/Home Management : 23-37 mins  Benito Mccreedy OTR/L [id number] 05/22/2015, 4:18 PM

## 2015-05-22 NOTE — Progress Notes (Signed)
Chaplain was paged by nurse that Pt wanted to see a Chaplain. Chaplain was working a high priority case. After leaving that case Chaplain visited the pt. The Pt ws concerned about his readiness to go home. He told his story of his broken relationships with family and friends. As we were preparing to pray his phone rang. It was his friend who he had a broken relationship with. He asked tha he hold on as we prayed. He was excited with the possibilities of mending the relationship. Chaplain excused himself in order for them to have privacy.   05/22/15 1900  Clinical Encounter Type  Visited With Patient  Visit Type Spiritual support  Referral From Nurse  Spiritual Encounters  Spiritual Needs Prayer;Emotional  Stress Factors  Patient Stress Factors Family relationships;Lack of caregivers  .

## 2015-05-22 NOTE — Progress Notes (Addendum)
CSW discussed case with supervisor and Market researcher- pt is NOT appropriate for LOG SNF placement.  CSW spoke with pt and explained that he does not qualify for charity placement (min assist walking 100 ft)- pt was very disappointed with this news and states he had been depending on being placed- he states he had a friend take his Lucianne Lei to store it for him and that he had another friend take his apartment keys so he could go by and check mail- CSW encouraged the patient to start making phone calls about regaining that property since at this point it looked like home with home health was the only option (pt could qualify for charity home health services)  Pt reports that he doesn't understand why [facility] did not accept him and that he was never given an explanation as to why he wasn't going there- per rehab notes it was the patient who felt he was being pushed into CIR and had refused to go but patient is stating he wanted to go and would still want to go- CSW spoke with rehab coordinator who will review case to see if they are willing to reconsider patient- per coordinator who worked with patient last week the patient became resistant to rehab and stated that he was being forced to sign paperwork against his will and this is what stopped the patient from being admitted to [facility].  Patient also has concerns about home situation- states that he does not know how he would get food and if he could get food he doesn't know how he would manage to afford it.  Pt does not have any reported income at this time and was surviving off of $2000/month from his father while he was the primary caretaker- no patient states he has nothing.   Pt is working on getting spousal benefits from his widow but states that wouldn't happen till late march and that he needs a lot of paperwork that he currently doesn't have access too (marriage license etc).   CSW provided patient with food resources as well as transportation resources.  RNCM  informed of plan for home health- anticipate DC home tomorrow.  Pt brother, [name], at bedside during second interview and hopes to help patient some [place].  Pt is very anxious about returning home but did not express suicidal ideation at this time.   Domenica Reamer, [place] Social Worker [phone]

## 2015-05-22 NOTE — Progress Notes (Signed)
CM made referral to [facility] for home health services, RN, SW,Aide. Per Dr.Aronson  LOG services x 4 weeks. CSW/ JENNA to f/u with obtaining LOG papers. CM to f/u with disposition needs.

## 2015-05-22 NOTE — Progress Notes (Signed)
PROGRESS NOTE  Scott Shelton [id number] DOB: 1950/08/23 DOA: 05/09/2015 PCP: No primary care provider on file.  HPI: Scott Shelton is an 65 y.o. male no significant PMH who was admitted 05/09/15 after suffering from a syncopal episode associated with hypoxia and hypothermia. Upon initial evaluation, he was found to have acute kidney injury with a creatinine of 5.82, hyperkalemia with a potassium of 7.3, hyponatremia with a sodium of 109 and mild leukocytosis with a WBC of 11.5. Of note, the patient also reported a one year history of a lower extremity eruption that had progressively worsened to involve the entirety of both legs.  Subjective / 24 H Interval events - no chest pain, shortness of breath, no abdominal pain, nausea or vomiting.  - Denies any suicidal or homicidal ideation  Assessment/Plan: Principal Problem:   Septic shock ([place]) Active Problems:   AKI (acute kidney injury) ([place])   Hyperkalemia   Lactic acidosis   Hypotension   Hypothermia   Hyponatremia   Syncope   Cellulitis   Elevated troponin   ARF (acute renal failure) (HCC)   Atrial fibrillation (HCC)   Ileus (HCC)   Hypokalemia   Cellulitis of right lower extremity   LVH (left ventricular hypertrophy)   Acute renal failure (HCC)   MDD (major depressive disorder), single episode, severe , no psychosis ([place])   Adjustment disorder with mixed anxiety and depressed mood   Shock, presumed septic + hypovolemic - Resolved - weaned off pressors 05/13/15.  Severe sepsis secondary to RLE cellulitis - Initially treated with Zosyn/vancomycin. Antibiotics narrowed to Rocephin 05/09/15 and received Rocephin until 2-09.  - off antibiotics, will ask wound care consult to follow  - Lower extremities evaluated by wound care nurse 05/12/15. Continue dressing changes per recommendations. Will ask wound care to reevaluate.  - D-dimer is elevated at 3.82, lower extremity Dopplers negative for DVT but calf veins poorly  visualized. - Blood cultures negative.  Newly diagnosed Atrial Fib/LVH - Continue amiodarone PO 200 mg BID. - Currently in NSR. - CHADS2Vasc score is 0-1, low risk.  Acute renal failure - Resolving. Serum creatinine has improved with hydration with his current GFR up to 85. -renal function stable.   Depression with suicidal ideation  - discussed with Dr. Harrington Shelton 2/12. No need for a sitter, patient can contract for safety  Hypokalemia  - Continue to supplement.  Hyponatremia - Hypovolemic - resolved.   Ileus - CT abd 05/12/15 c/w ileus.  - Gen Surgery following.  - Diet advanced to heart healthy 05/17/15. Tolerating diet.   Anemia in of setting renal failure - Hgb stable.    MRSA Pollinger - Continue decontamination therapy. Continue contact isolation.  Morbid obesity - BMI 44.87 - Status post PT/OT evaluations, physical therapy recommending SNF, however patient has no insurance, and will be unable to discharge to SNF per social worker - Will likely need home health on discharge    Diet: Diet Heart Room service appropriate?: Yes; Fluid consistency:: Thin Diet - low sodium heart healthy Fluids: none DVT Prophylaxis: heparin  Code Status: Full Code Family Communication: no family bedside  Disposition Plan: home 1 day  Consultants:  Psychiatry   Surgery  Nephrology  Pulmonology  Procedures:  None    Antibiotics  Zosyn 05/09/15---> 05/09/15  Vancomycin 05/09/15---> 05/09/15  Rocephin 05/09/15 ---> 05/18/15   Studies  No results found.  Objective  Filed Vitals:   05/21/15 1450 05/21/15 2137 05/22/15 0437 05/22/15 1452  BP: 117/57 117/58 130/54 109/52  Pulse:  72 74 76 78  Temp: 98 F (36.7 C) 99.3 F (37.4 C) 99.3 F (37.4 C) 98.3 F (36.8 C)  TempSrc: Oral Oral Oral Oral  Resp: 20 18 18 18   Height:      Weight:   150 kg (330 lb 11 oz)   SpO2: 97% 98% 96% 98%    Intake/Output Summary (Last 24 hours) at 05/22/15 1459 Last data filed at  05/22/15 0830  Gross per 24 hour  Intake    440 ml  Output    775 ml  Net   -335 ml   Filed Weights   05/18/15 0500 05/20/15 0500 05/22/15 0437  Weight: 150.4 kg (331 lb 9.2 oz) 150.9 kg (332 lb 10.8 oz) 150 kg (330 lb 11 oz)    Exam:  GENERAL: NAD  HEENT: no scleral icterus, PERRL  NECK: supple, no LAD  LUNGS: CTA biL, no wheezing  HEART: RRR without MRG  ABDOMEN: soft, non tender  EXTREMITIES: no clubbing / cyanosis  NEUROLOGIC: non focal   Data Reviewed: Basic Metabolic Panel:  Recent Labs Lab 05/16/15 0508 05/17/15 0540 05/18/15 0500 05/20/15 0815 05/21/15 0345  NA 135 136 139 138 142  K 3.2* 3.2* 3.1* 3.6 3.9  CL 95* 97* 105 107 110  CO2 28 29 27 25 22   GLUCOSE 91 96 101* 103* 96  BUN 17 12 10 8 10   CREATININE 1.53* 1.39* 1.35* 1.24 1.27*  CALCIUM 8.8* 9.3 8.7* 9.0 9.3  MG  --  1.8  --   --   --    Liver Function Tests:  Recent Labs Lab 05/16/15 0508  AST 19  ALT 14*  ALKPHOS 56  BILITOT 0.5  PROT 4.8*  ALBUMIN 2.1*   No results for input(s): LIPASE, AMYLASE in the last 168 hours. No results for input(s): AMMONIA in the last 168 hours. CBC:  Recent Labs Lab 05/16/15 0508 05/17/15 0540 05/18/15 0500 05/21/15 0345  WBC 7.4 6.8 6.2 5.4  NEUTROABS  --  4.7 4.3  --   HGB 8.4* 8.8* 8.2* 8.2*  HCT 27.0* 27.0* 26.2* 26.9*  MCV 97.1 96.4 96.7 98.2  PLT 239 210 203 203     Scheduled Meds: . amiodarone  200 mg Oral Daily  . docusate sodium  100 mg Oral Daily  . FLUoxetine  20 mg Oral Daily  . heparin  5,000 Units Subcutaneous 3 times per day  . hydrocerin   Topical Daily  . traZODone  50 mg Oral QHS   Continuous Infusions:   Scott Board, MD [facility] Pager [phone]. If 7 PM - 7 AM, please contact night-coverage at [email or web address], password [facility] 05/22/2015, 2:59 PM  LOS: 13 days

## 2015-05-22 NOTE — Progress Notes (Signed)
I received a request from [facility], CSW to look at pt's case again as pt. Did not want to come to CIR on Friday. Per Eliezer Lofts, the  acute team is currently working to try for DC home this afternoon.  I will discuss case with rehab team.  It appears that pt. may now be too high level for intensive rehab program.  If pt. remains in house overnight, we will follow up tomorrow.    [facility] Admissions Coordinator Cell [phone] Office [phone]

## 2015-05-22 NOTE — Progress Notes (Signed)
Physical Therapy Treatment Patient Details Name: Scott Shelton MRN: [id number] DOB: 09-07-1950 Today's Date: 05/22/2015    History of Present Illness 65 year old male with no PMH. 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 1 year history of BLE edema and wounds. Suffered syncopal episode 1/31 and found to be hypotensive with acute renal failure, hypernatremia, hyperkalemia.    PT Comments    Patient is progressing slowly towards his goals and with ambulation. He fatigues quickly. Patient was not a candidate for CIR as he does not have support at discharge. Recommend SNF at this time to increase functional independence and increase activity level.   Follow Up Recommendations  SNF     Equipment Recommendations  Rolling walker with 5" wheels (Wide RW)    Recommendations for Other Services       Precautions / Restrictions Precautions Precautions: Fall Precaution Comments: B LE wounds w/ wraps    Mobility  Bed Mobility Overal bed mobility: Needs Assistance Bed Mobility: Supine to Sit     Supine to sit: Min guard     General bed mobility comments: Relys on bed rails  Transfers Overall transfer level: Needs assistance Equipment used: Rolling walker (2 wheeled)   Sit to Stand: Min guard         General transfer comment: Pt continues to pull up on rolling walker despite cues to push from bed  Ambulation/Gait Ambulation/Gait assistance: Min guard Ambulation Distance (Feet): 100 Feet Assistive device: Rolling walker (2 wheeled) Gait Pattern/deviations: Step-through pattern;Decreased step length - right;Decreased step length - left;Wide base of support Gait velocity: decreased Gait velocity interpretation: Below normal speed for age/gender General Gait Details: Continued with dyspnea 2/4 with ambulation. Limited by fatigue. Would benefit from wide RW for support   Stairs            Wheelchair Mobility     Modified Rankin (Stroke Patients Only)       Balance                                    Cognition Arousal/Alertness: Awake/alert Behavior During Therapy: WFL for tasks assessed/performed Overall Cognitive Status: Within Functional Limits for tasks assessed                      Exercises      General Comments        Pertinent Vitals/Pain Pain Assessment: 0-10 Pain Score: 1  Pain Location: R foot Pain Descriptors / Indicators: Sore Pain Intervention(s): Monitored during session    Home Living                      Prior Function            PT Goals (current goals can now be found in the care plan section) Progress towards PT goals: Progressing toward goals    Frequency  Min 3X/week    PT Plan Discharge plan needs to be updated    Co-evaluation             End of Session   Activity Tolerance: Patient limited by fatigue Patient left: in bed;with bed alarm set     Time: [id number] PT Time Calculation (min) (ACUTE ONLY): 18 min  Charges:  $Gait Training: 8-22 mins  G Codes:      Jacqualyn Posey 05/22/2015, 9:37 AM  .Lonni Fix

## 2015-05-23 MED ORDER — ONDANSETRON 4 MG PO TBDP: 4.0000 mg | ORAL_TABLET | Freq: Three times a day (TID) | ORAL | Status: DC | PRN

## 2015-05-23 MED ORDER — TRAZODONE HCL 50 MG PO TABS: 50.0000 mg | ORAL_TABLET | Freq: Every day | ORAL | Status: DC

## 2015-05-23 MED ORDER — MUPIROCIN 2 % EX OINT: 1.0000 "application " | TOPICAL_OINTMENT | Freq: Two times a day (BID) | CUTANEOUS | Status: DC

## 2015-05-23 MED ORDER — AMIODARONE HCL 200 MG PO TABS: 200.0000 mg | ORAL_TABLET | Freq: Every day | ORAL | Status: DC

## 2015-05-23 MED ORDER — BISACODYL 10 MG RE SUPP: 10.0000 mg | Freq: Every day | RECTAL | Status: DC | PRN

## 2015-05-23 MED ORDER — HYDROCERIN EX CREA: 1.0000 "application " | TOPICAL_CREAM | Freq: Every day | CUTANEOUS | Status: DC

## 2015-05-23 MED ORDER — FLUOXETINE HCL 20 MG PO CAPS: 20.0000 mg | ORAL_CAPSULE | Freq: Every day | ORAL | Status: DC

## 2015-05-23 NOTE — Discharge Instructions (Signed)
Follow with wound care center in 1 week  Please get a complete blood count and chemistry panel checked by your Primary MD at your next visit, and again as instructed by your Primary MD. Please get your medications reviewed and adjusted by your Primary MD.  Please request your Primary MD to go over all Hospital Tests and Procedure/Radiological results at the follow up, please get all Hospital records sent to your Prim MD by signing hospital release before you go home.  If you had Pneumonia of Lung problems at the Hospital: Please get a 2 view Chest X ray done in 6-8 weeks after hospital discharge or sooner if instructed by your Primary MD.  If you have Congestive Heart Failure: Please call your Cardiologist or Primary MD anytime you have any of the following symptoms:  1) 3 pound weight gain in 24 hours or 5 pounds in 1 week  2) shortness of breath, with or without a dry hacking cough  3) swelling in the hands, feet or stomach  4) if you have to sleep on extra pillows at night in order to breathe  Follow cardiac low salt diet and 1.5 lit/day fluid restriction.  If you have diabetes Accuchecks 4 times/day, Once in AM empty stomach and then before each meal. Log in all results and show them to your primary doctor at your next visit. If any glucose reading is under 80 or above 300 call your primary MD immediately.  If you have Seizure/Convulsions/Epilepsy: Please do not drive, operate heavy machinery, participate in activities at heights or participate in high speed sports until you have seen by Primary MD or a Neurologist and advised to do so again.  If you had Gastrointestinal Bleeding: Please ask your Primary MD to check a complete blood count within one week of discharge or at your next visit. Your endoscopic/colonoscopic biopsies that are pending at the time of discharge, will also need to followed by your Primary MD.  Get Medicines reviewed and adjusted. Please take all your  medications with you for your next visit with your Primary MD  Please request your Primary MD to go over all hospital tests and procedure/radiological results at the follow up, please ask your Primary MD to get all Hospital records sent to his/her office.  If you experience worsening of your admission symptoms, develop shortness of breath, life threatening emergency, suicidal or homicidal thoughts you must seek medical attention immediately by calling 911 or calling your MD immediately  if symptoms less severe.  You must read complete instructions/literature along with all the possible adverse reactions/side effects for all the Medicines you take and that have been prescribed to you. Take any new Medicines after you have completely understood and accpet all the possible adverse reactions/side effects.   Do not drive or operate heavy machinery when taking Pain medications.   Do not take more than prescribed Pain, Sleep and Anxiety Medications  Special Instructions: If you have smoked or chewed Tobacco  in the last 2 yrs please stop smoking, stop any regular Alcohol  and or any Recreational drug use.  Wear Seat belts while driving.  Please note You were cared for by a hospitalist during your hospital stay. If you have any questions about your discharge medications or the care you received while you were in the hospital after you are discharged, you can call the unit and asked to speak with the hospitalist on call if the hospitalist that took care of you is not available. Once  you are discharged, your primary care physician will handle any further medical issues. Please note that NO REFILLS for any discharge medications will be authorized once you are discharged, as it is imperative that you return to your primary care physician (or establish a relationship with a primary care physician if you do not have one) for your aftercare needs so that they can reassess your need for medications and monitor your  lab values.  You can reach the hospitalist office at phone [phone] or fax [phone]   If you do not have a primary care physician, you can call [phone] for a physician referral.  Activity: As tolerated with Full fall precautions use walker/cane & assistance as needed  Diet: heart healthy  Disposition Home

## 2015-05-23 NOTE — Care Management Note (Signed)
Case Management Note  Patient Details  Name: Scott Shelton MRN: [id number] Date of Birth: 15-May-1950  Subjective/Objective:                 Patient to discharge to home, with [facility] through [facility]. [facility] able o provide RN and PT but not [facility]. [facility] providing rolling walker through charity. Patient received pamphlet from [place]. CM explained to patient that they may use the on site pharmacy to fill prescriptions given to them at discharge. Patient aware that the [facility] pharmacy will not fill narcotics or pain medications prior to the patient being seen by one of their physicians.  Patient aware that they must be seen as a patient prior to the pharmacy filling the prescriptions a second time. Patient also provided with transportation resources for people over 77 years old.    Action/Plan:  DC to home today w/ HH and DME Expected Discharge Date:                  Expected Discharge Plan:  [place]  In-House Referral:  Financial Counselor  Discharge planning Services  CM Consult  Post Acute Care Choice:  Home Health, Durable Medical Equipment Choice offered to:     DME Arranged:  Walker rolling DME Agency:  [place]. (through [place])  [place]:  RN, Social Work, PT [place] Agency:  [place] ([place] services x  4WEEKS per [place])  Status of Service:  Completed, signed off  Medicare Important Message Given:    Date Medicare IM Given:    Medicare IM give by:    Date Additional Medicare IM Given:    Additional Medicare Important Message give by:     If discussed at [place] of Stay Meetings, dates discussed:    Additional Comments:  Carles Collet, RN 05/23/2015, 11:08 AM

## 2015-05-23 NOTE — Progress Notes (Signed)
   05/23/15 1100  Clinical Encounter Type  Visited With Patient  Visit Type Follow-up;Psychological support;Spiritual support;Social support  Referral From Chaplain;Nurse  Consult/Referral To Chaplain  Spiritual Encounters  Spiritual Needs Prayer;Ritual  Stress Factors  Patient Stress Factors Exhausted;Family relationships;Financial concerns;Health changes  Family Stress Factors Not reviewed   [place] stopped by to see Pt. And to follow up after his sleepless night. Pt. Spoke about stressful family relationships and said that he was worried about his overall health. He hopes to go to another skilled nursing place for rehab. He says, that he" isn't ready to be on his own yet, due to his health". Chaplain provided emotional care via prayer and will continue to check in on this Pt.  Thanks,

## 2015-05-23 NOTE — Progress Notes (Signed)
NURSING PROGRESS NOTE  Scott Shelton [id number] Discharge Data: 05/23/2015 3:29 PM Attending Provider: Caren Griffins, MD PCP:No primary care provider on file.   [name] to be D/C'd Home per MD order.    All IV's will be discontinued and monitored for bleeding.  All belongings will be returned to patient for patient to take home.  Last Documented Vital Signs:  Blood pressure 105/49, pulse 69, temperature 98.4 F (36.9 C), temperature source Oral, resp. rate 18, height 6' (1.829 m), weight 151.6 kg (334 lb 3.5 oz), SpO2 97 %.  Joslyn Hy, MSN, RN, [employer]

## 2015-05-23 NOTE — Progress Notes (Signed)
Patient will discharge to home Anticipated discharge date: 2/14 Family notified: pt brother Transportation by [employer]- scheduled for 2:45pm  CSW signing off.  Domenica Reamer, [place] Social Worker [phone]

## 2015-05-23 NOTE — Discharge Summary (Signed)
Physician Discharge Summary  Scott Shelton [id number] DOB: [birth year] DOA: 05/09/2015  PCP: No primary care provider on file.  Admit date: 05/09/2015 Discharge date: 05/23/2015  Time spent: > 30 minutes  Recommendations for Outpatient Follow-up:  1. Follow up with Wound care as scheduled as below 2. Home health on discharge   Discharge Diagnoses:  Principal Problem:   Septic shock ([place]) Active Problems:   AKI (acute kidney injury) ([place])   Hyperkalemia   Lactic acidosis   Hypotension   Hypothermia   Hyponatremia   Syncope   Cellulitis   Elevated troponin   ARF (acute renal failure) ([facility])   Atrial fibrillation ([facility])   Ileus ([facility])   Hypokalemia   Cellulitis of right lower extremity   LVH (left ventricular hypertrophy)   Acute renal failure ([facility])   MDD (major depressive disorder), single episode, severe , no psychosis ([place])   Adjustment disorder with mixed anxiety and depressed mood  Discharge Condition: stable  Diet recommendation: heart healthy  Filed Weights   05/20/15 0500 05/22/15 0437 05/23/15 0500  Weight: 150.9 kg (332 lb 10.8 oz) 150 kg (330 lb 11 oz) 151.6 kg (334 lb 3.5 oz)    History of present illness:  See H&P, Labs, Consult and Test reports for all details in brief, patient is an 65 y.o. male no significant PMH who was admitted 05/09/15 after suffering from a syncopal episode associated with hypoxia and hypothermia. Upon initial evaluation, he was found to have acute kidney injury with a creatinine of 5.82, hyperkalemia with a potassium of 7.3, hyponatremia with a sodium of 109 and mild leukocytosis with a WBC of 11.5. Of note, the patient also reported a one year history of a lower extremity eruption that had progressively worsened to involve the entirety of both legs.  Hospital Course:  Severe sepsis with shock secondary to RLE cellulitis - Initially treated with Zosyn/vancomycin as well as pressor support. Antibiotics narrowed to Rocephin 05/09/15 and  received Rocephin until 2-09 when he completed the treatment. He has been off antibiotics since 2/9, clinically stable and without fever/chills leukocytosis. Wound care consulted and followed as well. Continue dressing changes per recommendations. Blood cultures negative. Newly diagnosed Atrial Fib/LVH - Continue amiodarone PO 200 mg BID.Currently in NSR. CHADS2Vasc score is 0-1, low risk. Acute renal failure - Resolving. Serum creatinine has improved with hydration Depression with suicidal ideation - patient initially endorsing suicidal ideation, given poor social support, psychiatry was consulted and patient was started on Fluoxetine and trazodone, he was reevaluated by psychiatry on 2/12 and that time the sitter was recommended to be discontinued and patient can contract for safety and does not meet inpatient psychiatric criteria. Hyponatremia - Hypovolemic - resolved.  Ileus - CT abd 05/12/15 c/w ileus. Gen Surgery consulted, ileus resolved, tolerating diet Anemia in of setting renal failure - Hgb stable.   MRSA [place] - Continue decontamination therapy. Continue contact isolation. Morbid obesity - BMI 44.87 - Status post PT/OT evaluations, physical therapy recommending SNF, however patient has no insurance, and will be unable to discharge to SNF per social worker and case management. He was discharged home with home health.  Procedures:  None    Consultations:  Psychiatry   General surgery  Nephrology  Pulmonology  Discharge Exam: Filed Vitals:   05/22/15 1452 05/22/15 2127 05/23/15 0500 05/23/15 0620  BP: 109/52 120/62  105/49  Pulse: 78 79  69  Temp: 98.3 F (36.8 C) 98.6 F (37 C)  98.4 F (36.9 C)  TempSrc: Oral Oral  Oral  Resp: 18   18  Height:      Weight:   151.6 kg (334 lb 3.5 oz)   SpO2: 98% 98%  97%   General: NAD Cardiovascular: RRR Respiratory: CTA biL  Discharge Instructions Activity:  As tolerated   Get Medicines reviewed and adjusted: Please  take all your medications with you for your next visit with your Primary MD  Please request your Primary MD to go over all hospital tests and procedure/radiological results at the follow up, please ask your Primary MD to get all Hospital records sent to his/her office.  If you experience worsening of your admission symptoms, develop shortness of breath, life threatening emergency, suicidal or homicidal thoughts you must seek medical attention immediately by calling 911 or calling your MD immediately if symptoms less severe.  You must read complete instructions/literature along with all the possible adverse reactions/side effects for all the Medicines you take and that have been prescribed to you. Take any new Medicines after you have completely understood and accpet all the possible adverse reactions/side effects.   Do not drive when taking Pain medications.   Do not take more than prescribed Pain, Sleep and Anxiety Medications  Special Instructions: If you have smoked or chewed Tobacco in the last 2 yrs please stop smoking, stop any regular Alcohol and or any Recreational drug use.  Wear Seat belts while driving.  Please note  You were cared for by a hospitalist during your hospital stay. Once you are discharged, your primary care physician will handle any further medical issues. Please note that NO REFILLS for any discharge medications will be authorized once you are discharged, as it is imperative that you return to your primary care physician (or establish a relationship with a primary care physician if you do not have one) for your aftercare needs so that they can reassess your need for medications and monitor your lab values. Discharge Instructions    Call MD for:  persistant nausea and vomiting    Complete by:  As directed      Call MD for:  severe uncontrolled pain    Complete by:  As directed      Call MD for:  temperature >100.4    Complete by:  As directed      Diet - low  sodium heart healthy    Complete by:  As directed      Discharge wound care:    Complete by:  As directed   Per wound care RN recommendations.     Increase activity slowly    Complete by:  As directed             Medication List    TAKE these medications        aluminum-magnesium hydroxide-simethicone [id number] MG/5ML Susp  Commonly known as:  MAALOX  Take 30 mLs by mouth 3 (three) times daily as needed (acid reflux). Acid reflux     amiodarone 200 MG tablet  Commonly known as:  PACERONE  Take 1 tablet (200 mg total) by mouth daily.     bisacodyl 10 MG suppository  Commonly known as:  DULCOLAX  Place 1 suppository (10 mg total) rectally daily as needed for moderate constipation.     docusate sodium 100 MG capsule  Commonly known as:  COLACE  Take 1 capsule (100 mg total) by mouth daily.     FLUoxetine 20 MG capsule  Commonly known as:  PROZAC  Take 1  capsule (20 mg total) by mouth daily.     hydrocerin Crea  Apply 1 application topically daily.     mupirocin ointment 2 %  Commonly known as:  BACTROBAN  Place 1 application into the nose 2 (two) times daily.     ondansetron 4 MG disintegrating tablet  Commonly known as:  ZOFRAN ODT  Take 1 tablet (4 mg total) by mouth every 8 (eight) hours as needed for nausea or vomiting.     traZODone 50 MG tablet  Commonly known as:  DESYREL  Take 1 tablet (50 mg total) by mouth at bedtime.           Follow-up Information    Follow up with [facility]. Go on 05/29/2015.   Why:  11:30. Arrive 10 minutes prior to your appointment time, bring photo ID. You may fill your meds here at discharge.   Contact information:   [address] [place] [employer] [phone]      Follow up with [facility]              On 06/14/2015.   Why:  at 8:00 am with Dr Con Memos.   Contact information:   [address]  [phone] [id number]      Follow up with [facility].   Why:  will deliver rolling walker to room prior to discharge   Contact information:   [address] [place] [address] [phone]       Follow up with [facility].   Why:  Home health PT and RN to start 1-2 days after discharge   Contact information:   [address] [place] [address] [phone]       The results of significant diagnostics from this hospitalization (including imaging, microbiology, ancillary and laboratory) are listed below for reference.    Significant Diagnostic Studies: Ct Abdomen Pelvis Wo Contrast  05/12/2015  CLINICAL DATA:  65 year old male with small bowel obstruction on plain radiographs. Initial encounter. EXAM: CT ABDOMEN AND PELVIS WITHOUT CONTRAST TECHNIQUE: Multidetector CT imaging of the abdomen and pelvis was performed following the standard protocol without IV contrast. COMPARISON:  Abdominal films 0453 hours today and earlier. FINDINGS: Consolidation with some air bronchograms in both posterior lower lobes, posterior basal segments. No associated pleural effusion. Elevation of the diaphragm. There is a superimposed small 3 mm lung nodule in the right lower lobe on series 3, image 11. No pericardial effusion. Enteric tube in place, terminates in the body of the stomach. No acute osseous abnormality identified. Benign vertebral body hemangioma in the right L5 level. Large body habitus. Foley catheter in the urinary bladder which is largely decompressed. Small volume gas within the bladder. No pelvic free fluid. Fluid in the rectum and sigmoid colon. Fluid in the largely decompressed left colon and splenic flexure. Gas distention of the transverse colon and hepatic hepatic flexure. There is fluid in the right colon. There is a trace amount of contrast layering in the cecum. The appendix is normal. The terminal ileum is decompressed. The distal small  bowel is decompressed with a gradual transition to dilated air and fluid containing mid small bowel loops. There is occasional oral contrast in the distal decompressed an the mildly to moderately dilated mid small bowel loops. The largest small bowel loops are 5 cm diameter, and proximal loops contain dense oral contrast along  with air. The stomach is dilated with oral contrast and some air. The duodenum is mildly dilated. There is a small volume of free fluid about the spleen, about proximal jejunal loops in the left upper quadrant, and layering along the left gutter. No pneumoperitoneum. Numerous fatty gallstones.  No pericholecystic inflammation. Negative noncontrast liver, spleen, pancreas, adrenal glands, and kidneys are within normal limits for age. Aortoiliac calcified atherosclerosis noted. No lymphadenopathy identified. There is nonspecific ventral abdominal wall fat stranding along the left flank and about the umbilicus. There is no abdominal wall hernia identified. IMPRESSION: 1. Dilated stomach and small bowel loops with a gradual transition to decompressed ileum. Fluid and some gas throughout the colon. Small volume mesenteric and left abdominal free fluid. No free air. 2. Constellation of findings favors inflammatory related ileus over a mechanical bowel obstruction. 3. Enteric tube in place, terminates in the mid stomach. 4. Bilateral lower lobe consolidation, either pneumonia or severe atelectasis. No associated pleural effusion. 5. Nonspecific ventral and left flank body wall edema or inflammation. Query cellulitis. No abdominal wall hernia identified. 6. Cholelithiasis without CT evidence of acute cholecystitis. 7. Calcified aortic atherosclerosis. Electronically Signed   By: Genevie Ann M.D.   On: 05/12/2015 15:39   Dg Chest 2 View  05/09/2015  CLINICAL DATA:  Speech syncope, lightheadedness EXAM: CHEST  2 VIEW COMPARISON:  None. FINDINGS: There is mild elevation of the right diaphragm. There is no  focal parenchymal opacity. There is no pleural effusion or pneumothorax. The heart and mediastinal contours are unremarkable. The osseous structures are unremarkable. IMPRESSION: No active cardiopulmonary disease. Electronically Signed   By: Kathreen Devoid   On: 05/09/2015 12:30   Dg Abd 1 View  05/11/2015  CLINICAL DATA:  Evaluate nasogastric tube placement EXAM: ABDOMEN - 1 VIEW COMPARISON:  May 11, 2015 FINDINGS: Nasogastric tube is identified with distal tip in the stomach. There are multiple air-filled dilated small bowel loops. Left central venous line is identified with distal tip in the superior vena cava. IMPRESSION: Nasogastric tube distal tip in the stomach. Small bowel obstruction. Electronically Signed   By: Abelardo Diesel M.D.   On: 05/11/2015 17:43   US Renal  05/09/2015  CLINICAL DATA:  Acute renal failure. EXAM: RENAL / URINARY TRACT ULTRASOUND COMPLETE COMPARISON:  None. FINDINGS: Right Kidney: Length: 11 cm. Echogenicity within normal limits. No mass or hydronephrosis visualized. Left Kidney: Length: 12.8 cm. Echogenicity within normal limits. No mass or hydronephrosis visualized. Bladder: Decompressed secondary to Foley catheter. IMPRESSION: No renal abnormality seen. Electronically Signed   By: Marijo Conception, M.D.   On: 05/09/2015 16:00   Dg Chest Port 1 View  05/10/2015  CLINICAL DATA:  Central line placement, acute respiratory failure EXAM: PORTABLE CHEST 1 VIEW COMPARISON:  05/09/2015 FINDINGS: Cardiomediastinal silhouette is stable. No segmental infiltrate or pulmonary edema. Mild infrahilar atelectasis or bronchitic changes. There is left IJ central line with tip in distal SVC. No pneumothorax. IMPRESSION: No infiltrate or pulmonary edema. No pneumothorax. Left IJ central line in place. Mild infrahilar atelectasis or bronchitic changes. Electronically Signed   By: Lahoma Crocker M.D.   On: 05/10/2015 14:24   Dg Abd Portable 1v-small Bowel Obstruction Protocol-initial, 8 Hr  Delay  05/12/2015  CLINICAL DATA:  NG tube evaluation. EXAM: PORTABLE ABDOMEN - 1 VIEW COMPARISON:  05/11/2015. FINDINGS: NG tube is noted coiled in stomach. Contrast is noted in the stomach. Persistent severely distended loops of small bowel and possibly large bowel again noted. Mild bowel  wall thickening in noted in a right lower quadrant bowel loop. This can be seen with inflammatory or ischemic change. No free air identified . IMPRESSION: 1. NG tube noted coiled stomach. Contrast is noted within the stomach. 2. Persistent severely distended loops of small bowel and possibly large bowel again noted. No interim improvement. Thickening of bowel wall is noted a right lower quadrant bowel loop. This can be seen with inflammatory or ischemic change. No free air identified. Electronically Signed   By: Marcello Moores  Register   On: 05/12/2015 07:15   Dg Abd Portable 1v  05/11/2015  CLINICAL DATA:  65 year old with acute onset of mid and left upper and lower quadrant abdominal pain which began this morning, associated with bilious vomiting. EXAM: PORTABLE ABDOMEN - 1 VIEW COMPARISON:  None. FINDINGS: Gaseous distention of multiple loops of small bowel in the abdomen and upper pelvis. Gas within normal caliber colon. Moderate gaseous distention of the stomach. No suggestion of free air on the supine image. IMPRESSION: Small bowel obstruction. Electronically Signed   By: Evangeline Dakin M.D.   On: 05/11/2015 16:02    Labs: Basic Metabolic Panel:  Recent Labs Lab 05/17/15 0540 05/18/15 0500 05/20/15 0815 05/21/15 0345  NA 136 139 138 142  K 3.2* 3.1* 3.6 3.9  CL 97* 105 107 110  CO2 29 27 25 22   GLUCOSE 96 101* 103* 96  BUN 12 10 8 10   CREATININE 1.39* 1.35* 1.24 1.27*  CALCIUM 9.3 8.7* 9.0 9.3  MG 1.8  --   --   --    CBC:  Recent Labs Lab 05/17/15 0540 05/18/15 0500 05/21/15 0345  WBC 6.8 6.2 5.4  NEUTROABS 4.7 4.3  --   HGB 8.8* 8.2* 8.2*  HCT 27.0* 26.2* 26.9*  MCV 96.4 96.7 98.2  PLT 210  203 203    Signed:  Evelyn Aguinaldo  [facility] 05/23/2015, 3:46 PM

## 2015-05-27 ENCOUNTER — Inpatient Hospital Stay (HOSPITAL_COMMUNITY)
Admission: EM | Admit: 2015-05-27 | Discharge: 2015-06-02 | DRG: 175 | Disposition: A | Discharge status: Skilled Nursing Facility | Payer: Self-pay | Attending: Internal Medicine | Admitting: Internal Medicine

## 2015-05-27 ENCOUNTER — Emergency Department (HOSPITAL_COMMUNITY): Payer: Self-pay

## 2015-05-27 ENCOUNTER — Encounter (HOSPITAL_COMMUNITY): Payer: Self-pay

## 2015-05-27 DIAGNOSIS — Z8614 Personal history of Methicillin resistant Staphylococcus aureus infection: Secondary | ICD-10-CM

## 2015-05-27 DIAGNOSIS — A047 Enterocolitis due to Clostridium difficile: Secondary | ICD-10-CM | POA: Diagnosis present

## 2015-05-27 DIAGNOSIS — K219 Gastro-esophageal reflux disease without esophagitis: Secondary | ICD-10-CM | POA: Diagnosis present

## 2015-05-27 DIAGNOSIS — Z79899 Other long term (current) drug therapy: Secondary | ICD-10-CM

## 2015-05-27 DIAGNOSIS — I2699 Other pulmonary embolism without acute cor pulmonale: Secondary | ICD-10-CM

## 2015-05-27 DIAGNOSIS — J9601 Acute respiratory failure with hypoxia: Secondary | ICD-10-CM | POA: Diagnosis present

## 2015-05-27 DIAGNOSIS — I272 Other secondary pulmonary hypertension: Secondary | ICD-10-CM | POA: Diagnosis present

## 2015-05-27 DIAGNOSIS — F329 Major depressive disorder, single episode, unspecified: Secondary | ICD-10-CM | POA: Diagnosis present

## 2015-05-27 DIAGNOSIS — N183 Chronic kidney disease, stage 3 unspecified: Secondary | ICD-10-CM | POA: Insufficient documentation

## 2015-05-27 DIAGNOSIS — I890 Lymphedema, not elsewhere classified: Secondary | ICD-10-CM | POA: Insufficient documentation

## 2015-05-27 DIAGNOSIS — N179 Acute kidney failure, unspecified: Secondary | ICD-10-CM | POA: Diagnosis present

## 2015-05-27 DIAGNOSIS — R600 Localized edema: Secondary | ICD-10-CM

## 2015-05-27 DIAGNOSIS — I878 Other specified disorders of veins: Secondary | ICD-10-CM | POA: Diagnosis present

## 2015-05-27 DIAGNOSIS — R55 Syncope and collapse: Secondary | ICD-10-CM | POA: Diagnosis present

## 2015-05-27 DIAGNOSIS — I480 Paroxysmal atrial fibrillation: Secondary | ICD-10-CM | POA: Diagnosis present

## 2015-05-27 DIAGNOSIS — E876 Hypokalemia: Secondary | ICD-10-CM | POA: Diagnosis present

## 2015-05-27 DIAGNOSIS — A0472 Enterocolitis due to Clostridium difficile, not specified as recurrent: Secondary | ICD-10-CM | POA: Insufficient documentation

## 2015-05-27 DIAGNOSIS — Z6841 Body Mass Index (BMI) 40.0 and over, adult: Secondary | ICD-10-CM

## 2015-05-27 DIAGNOSIS — R911 Solitary pulmonary nodule: Secondary | ICD-10-CM | POA: Diagnosis present

## 2015-05-27 LAB — COMPREHENSIVE METABOLIC PANEL
ALK PHOS: 79 U/L (ref 38–126)
ALT: 27 U/L (ref 17–63)
AST: 20 U/L (ref 15–41)
Albumin: 3.1 g/dL — ABNORMAL LOW (ref 3.5–5.0)
Anion gap: 11 (ref 5–15)
BILIRUBIN TOTAL: 0.6 mg/dL (ref 0.3–1.2)
BUN: 15 mg/dL (ref 6–20)
CALCIUM: 9.7 mg/dL (ref 8.9–10.3)
CO2: 23 mmol/L (ref 22–32)
CREATININE: 1.48 mg/dL — AB (ref 0.61–1.24)
Chloride: 107 mmol/L (ref 101–111)
GFR, EST AFRICAN AMERICAN: 57 mL/min — AB (ref >60)
GFR, EST NON AFRICAN AMERICAN: 49 mL/min — AB (ref >60)
Glucose, Bld: 103 mg/dL — ABNORMAL HIGH (ref 65–99)
Potassium: 4 mmol/L (ref 3.5–5.1)
Sodium: 141 mmol/L (ref 135–145)
TOTAL PROTEIN: 5.7 g/dL — AB (ref 6.5–8.1)

## 2015-05-27 LAB — CBC WITH DIFFERENTIAL/PLATELET
BASOS ABS: 0 10*3/uL (ref 0.0–0.1)
BASOS PCT: 1 %
EOS ABS: 0.1 10*3/uL (ref 0.0–0.7)
Eosinophils Relative: 2 %
HCT: 29.6 % — ABNORMAL LOW (ref 39.0–52.0)
HEMOGLOBIN: 8.9 g/dL — AB (ref 13.0–17.0)
Lymphocytes Relative: 21 %
Lymphs Abs: 1.2 10*3/uL (ref 0.7–4.0)
MCH: 30.2 pg (ref 26.0–34.0)
MCHC: 30.1 g/dL (ref 30.0–36.0)
MCV: 100.3 fL — ABNORMAL HIGH (ref 78.0–100.0)
MONOS PCT: 14 %
Monocytes Absolute: 0.8 10*3/uL (ref 0.1–1.0)
NEUTROS PCT: 62 %
Neutro Abs: 3.5 10*3/uL (ref 1.7–7.7)
Platelets: 265 10*3/uL (ref 150–400)
RBC: 2.95 MIL/uL — ABNORMAL LOW (ref 4.22–5.81)
RDW: 15.6 % — ABNORMAL HIGH (ref 11.5–15.5)
WBC: 5.6 10*3/uL (ref 4.0–10.5)

## 2015-05-27 LAB — URINALYSIS, ROUTINE W REFLEX MICROSCOPIC
GLUCOSE, UA: NEGATIVE mg/dL
HGB URINE DIPSTICK: NEGATIVE
Ketones, ur: 15 mg/dL — AB
Leukocytes, UA: NEGATIVE
Nitrite: NEGATIVE
PROTEIN: 30 mg/dL — AB
Specific Gravity, Urine: 1.034 — ABNORMAL HIGH (ref 1.005–1.030)
pH: 5.5 (ref 5.0–8.0)

## 2015-05-27 LAB — PROTIME-INR
INR: 1.31 (ref 0.00–1.49)
Prothrombin Time: 16.4 seconds — ABNORMAL HIGH (ref 11.6–15.2)

## 2015-05-27 LAB — URINE MICROSCOPIC-ADD ON
Bacteria, UA: NONE SEEN
RBC / HPF: NONE SEEN RBC/hpf (ref 0–5)
WBC, UA: NONE SEEN WBC/hpf (ref 0–5)

## 2015-05-27 LAB — I-STAT TROPONIN, ED: Troponin i, poc: 0.13 ng/mL (ref 0.00–0.08)

## 2015-05-27 LAB — GLUCOSE, CAPILLARY: Glucose-Capillary: 79 mg/dL (ref 65–99)

## 2015-05-27 LAB — I-STAT CG4 LACTIC ACID, ED: LACTIC ACID, VENOUS: 0.96 mmol/L (ref 0.5–2.0)

## 2015-05-27 LAB — BRAIN NATRIURETIC PEPTIDE: B Natriuretic Peptide: 140.4 pg/mL — ABNORMAL HIGH (ref 0.0–100.0)

## 2015-05-27 LAB — AMMONIA: AMMONIA: 15 umol/L (ref 9–35)

## 2015-05-27 MED ORDER — ASPIRIN 81 MG PO CHEW: 324.0000 mg | CHEWABLE_TABLET | ORAL | Status: AC

## 2015-05-27 MED ORDER — ONDANSETRON HCL 4 MG/2ML IJ SOLN
4.0000 mg | Freq: Four times a day (QID) | INTRAMUSCULAR | Status: DC | PRN
  Administered 2015-05-27: 4 mg via INTRAVENOUS
  Filled 2015-05-27: qty 2

## 2015-05-27 MED ORDER — HEPARIN BOLUS VIA INFUSION
6000.0000 [IU] | Freq: Once | INTRAVENOUS | Status: AC
  Administered 2015-05-27: 6000 [IU] via INTRAVENOUS
  Filled 2015-05-27: qty 6000

## 2015-05-27 MED ORDER — SENNOSIDES-DOCUSATE SODIUM 8.6-50 MG PO TABS: 1.0000 | ORAL_TABLET | Freq: Every evening | ORAL | Status: DC | PRN

## 2015-05-27 MED ORDER — ALUM & MAG HYDROXIDE-SIMETH 200-200-20 MG/5ML PO SUSP
30.0000 mL | ORAL | Status: DC | PRN
  Administered 2015-05-27 – 2015-05-31 (×3): 30 mL via ORAL
  Filled 2015-05-27 (×4): qty 30

## 2015-05-27 MED ORDER — FLUOXETINE HCL 20 MG PO CAPS: 20.0000 mg | ORAL_CAPSULE | Freq: Every day | ORAL | Status: DC

## 2015-05-27 MED ORDER — TRAZODONE HCL 50 MG PO TABS
50.0000 mg | ORAL_TABLET | Freq: Every day | ORAL | Status: DC
  Administered 2015-05-30 – 2015-06-02 (×4): 50 mg via ORAL
  Filled 2015-05-27 (×8): qty 1

## 2015-05-27 MED ORDER — IOHEXOL 350 MG/ML SOLN
80.0000 mL | Freq: Once | INTRAVENOUS | Status: AC | PRN
  Administered 2015-05-27: 100 mL via INTRAVENOUS

## 2015-05-27 MED ORDER — SODIUM CHLORIDE 0.9 % IV BOLUS (SEPSIS)
1000.0000 mL | Freq: Once | INTRAVENOUS | Status: AC
  Administered 2015-05-27: 1000 mL via INTRAVENOUS

## 2015-05-27 MED ORDER — HEPARIN (PORCINE) IN NACL 100-0.45 UNIT/ML-% IJ SOLN
2500.0000 [IU]/h | INTRAMUSCULAR | Status: DC
  Administered 2015-05-27 – 2015-05-28 (×2): 2000 [IU]/h via INTRAVENOUS
  Administered 2015-05-28: 2100 [IU]/h via INTRAVENOUS
  Administered 2015-05-29 (×2): 2300 [IU]/h via INTRAVENOUS
  Administered 2015-05-30: 2500 [IU]/h via INTRAVENOUS
  Administered 2015-05-30: 2300 [IU]/h via INTRAVENOUS
  Administered 2015-05-30 – 2015-05-31 (×2): 2500 [IU]/h via INTRAVENOUS
  Filled 2015-05-27 (×15): qty 250

## 2015-05-27 MED ORDER — SODIUM CHLORIDE 0.9 % IV SOLN: 250.0000 mL | INTRAVENOUS | Status: DC | PRN

## 2015-05-27 MED ORDER — ASPIRIN 300 MG RE SUPP: 300.0000 mg | RECTAL | Status: AC

## 2015-05-27 NOTE — ED Provider Notes (Signed)
CSN: [id number]     Arrival date & time 05/27/15  1521 History   First MD Initiated Contact with Patient 05/27/15 1552     Chief Complaint  Patient presents with  . Near Syncope  . Shortness of Breath     (Consider location/radiation/quality/duration/timing/severity/associated sxs/prior Treatment) HPI   Patient is a 65 year old male with past medical history significant for recent hospitalization for septic shock discharged on Tuesday. He has history of recent renal failure, hypercholesterolemia, hypotension, cellulitis of his right lower extremity, acute renal failure, A. fib and major depressive disorder.  Patient was living a very poor conditions prior to  recent hospitalization. At that time he went into acute renal failure required ICU admission for septic shock from cellulitis of bilateral lower extremities.  Patient reports that he has been following instructions since discharge at his house. He's been working with physical therapy. He reports today he's been increasingly short of breath. He reports short walks that he been able to do yesterday he was unable to do today.  Patient had shortness of breath with the short walks. He was letting his neighbor in after walking to the door, and teh neighbor watched him syncopized after walking to the door.  Patient is 83% on room air.  No fevers. Patient does have recent immobilization during auscultation as well as decreased mobility given his cellulitis and morbid obesity. Past Medical History  Diagnosis Date  . GERD (gastroesophageal reflux disease)    Past Surgical History  Procedure Laterality Date  . Cataract extraction     History reviewed. No pertinent family history. Social History  Substance Use Topics  . Smoking status: Never Smoker   . Smokeless tobacco: None  . Alcohol Use: No    Review of Systems  Constitutional: Positive for activity change and fatigue. Negative for fever.  HENT: Negative for congestion, ear  discharge and hearing loss.   Respiratory: Positive for shortness of breath. Negative for cough.   Cardiovascular: Negative for chest pain.  Gastrointestinal: Negative for abdominal pain.  Neurological: Negative for dizziness and numbness.  Psychiatric/Behavioral: Negative for agitation.  All other systems reviewed and are negative.     Allergies  Review of patient's allergies indicates no known allergies.  Home Medications   Prior to Admission medications   Medication Sig Start Date End Date Taking? Authorizing Provider  amiodarone (PACERONE) 200 MG tablet Take 1 tablet (200 mg total) by mouth daily. Patient not taking: Reported on 05/27/2015 05/23/15   Caren Griffins, MD  bisacodyl (DULCOLAX) 10 MG suppository Place 1 suppository (10 mg total) rectally daily as needed for moderate constipation. Patient not taking: Reported on 05/27/2015 05/23/15   Caren Griffins, MD  docusate sodium (COLACE) 100 MG capsule Take 1 capsule (100 mg total) by mouth daily. Patient not taking: Reported on 05/27/2015 05/19/15   Venetia Maxon Rama, MD  FLUoxetine (PROZAC) 20 MG capsule Take 1 capsule (20 mg total) by mouth daily. Patient not taking: Reported on 05/27/2015 05/23/15   Caren Griffins, MD  hydrocerin (EUCERIN) CREA Apply 1 application topically daily. Patient not taking: Reported on 05/27/2015 05/23/15   Caren Griffins, MD  mupirocin ointment (BACTROBAN) 2 % Place 1 application into the nose 2 (two) times daily. Patient not taking: Reported on 05/27/2015 05/23/15   Caren Griffins, MD  ondansetron (ZOFRAN ODT) 4 MG disintegrating tablet Take 1 tablet (4 mg total) by mouth every 8 (eight) hours as needed for nausea or vomiting. Patient not taking: Reported  on 05/27/2015 05/23/15   Caren Griffins, MD  traZODone (DESYREL) 50 MG tablet Take 1 tablet (50 mg total) by mouth at bedtime. Patient not taking: Reported on 05/27/2015 05/23/15   Caren Griffins, MD   BP 92/56 mmHg  Pulse 75  Temp(Src) 98.2  F (36.8 C)  Resp 19  Ht 6' (1.829 m)  Wt 330 lb 11 oz (150 kg)  BMI 44.84 kg/m2  SpO2 100% Physical Exam  Constitutional: He is oriented to person, place, and time.  Morbidly obese  HENT:  Head: Normocephalic.  Mouth/Throat: Oropharynx is clear and moist.  Eyes: Conjunctivae are normal. Right eye exhibits no discharge. Left eye exhibits no discharge.  Neck: No tracheal deviation present.  Cardiovascular: Normal rate.   Pulmonary/Chest: Effort normal. No stridor. No respiratory distress.  Hypoxic to 86% on room air.  Abdominal: Soft. There is no tenderness. There is no guarding.  Musculoskeletal: Normal range of motion.  Bilateral chronically swollen and erythematous bilateral legs with chronic lymphedema and cellulitic changes.  Neurological: He is oriented to person, place, and time. No cranial nerve deficit.  Skin: Skin is warm and dry. Rash noted. He is not diaphoretic.  Psychiatric: He has a normal mood and affect.  Nursing note and vitals reviewed.   ED Course  Procedures (including critical care time) Labs Review Labs Reviewed  COMPREHENSIVE METABOLIC PANEL - Abnormal; Notable for the following:    Glucose, Bld 103 (*)    Creatinine, Ser 1.48 (*)    Total Protein 5.7 (*)    Albumin 3.1 (*)    GFR calc non Af Amer 49 (*)    GFR calc Af Amer 57 (*)    All other components within normal limits  CBC WITH DIFFERENTIAL/PLATELET - Abnormal; Notable for the following:    RBC 2.95 (*)    Hemoglobin 8.9 (*)    HCT 29.6 (*)    MCV 100.3 (*)    RDW 15.6 (*)    All other components within normal limits  BRAIN NATRIURETIC PEPTIDE - Abnormal; Notable for the following:    B Natriuretic Peptide 140.4 (*)    All other components within normal limits  PROTIME-INR - Abnormal; Notable for the following:    Prothrombin Time 16.4 (*)    All other components within normal limits  URINALYSIS, ROUTINE W REFLEX MICROSCOPIC (NOT AT [facility]) - Abnormal; Notable for the following:     APPearance CLOUDY (*)    Specific Gravity, Urine 1.034 (*)    Bilirubin Urine SMALL (*)    Ketones, ur 15 (*)    Protein, ur 30 (*)    All other components within normal limits  URINE MICROSCOPIC-ADD ON - Abnormal; Notable for the following:    Squamous Epithelial / LPF 0-5 (*)    Crystals URIC ACID CRYSTALS (*)    All other components within normal limits  I-STAT TROPOININ, ED - Abnormal; Notable for the following:    Troponin i, poc 0.13 (*)    All other components within normal limits  AMMONIA  HEPARIN LEVEL (UNFRACTIONATED)  CBC  I-STAT CG4 LACTIC ACID, ED    Imaging Review Dg Chest 2 View  05/27/2015  CLINICAL DATA:  Chest pain.  Syncope today. EXAM: CHEST  2 VIEW COMPARISON:  05/10/2015 FINDINGS: The cardiomediastinal contours are unchanged, heart at the upper limits in size. Previous left central line is no longer seen. Pulmonary vasculature is normal. No consolidation, pleural effusion, or pneumothorax. No acute osseous abnormalities are seen. IMPRESSION:  No acute pulmonary process. Electronically Signed   By: Jeb Levering M.D.   On: 05/27/2015 18:00   Ct Angio Chest Pe W/cm &/or Wo Cm  05/27/2015  ADDENDUM REPORT: 05/27/2015 19:53 ADDENDUM: Positive for acute PE with CT evidence of right heart strain (RV/LV Ratio = 1.17) consistent with at least submassive (intermediate risk) PE. The presence of right heart strain has been associated with an increased risk of morbidity and mortality. Please activate Code PE by paging [phone]. Electronically Signed   By: Ilona Sorrel M.D.   On: 05/27/2015 19:53  05/27/2015  CLINICAL DATA:  Hypoxia.  Syncope.  Shortness of breath. EXAM: CT ANGIOGRAPHY CHEST WITH CONTRAST TECHNIQUE: Multidetector CT imaging of the chest was performed using the standard protocol during bolus administration of intravenous contrast. Multiplanar CT image reconstructions and MIPs were obtained to evaluate the vascular anatomy. CONTRAST:  114mL OMNIPAQUE IOHEXOL 350  MG/ML SOLN COMPARISON:  No prior chest CT. Chest radiograph from earlier today. 05/12/2015 CT abdomen. FINDINGS: Mediastinum/Nodes: The study is high quality for the evaluation of pulmonary embolism. There is a large burden of acute pulmonary embolism throughout the bilateral lobar, segmental and subsegmental pulmonary arterial tree involving all lung lobes. No main pulmonary artery or saddle pulmonary emboli. There is new dilatation of the main pulmonary artery (3.8 cm). Atherosclerotic nonaneurysmal thoracic aorta. There is new dilatation of the right ventricle and right atrium, with slight reflux of contrast into the intrahepatic IVC and hepatic veins. No significant pericardial fluid/thickening. Normal visualized thyroid. Normal esophagus. No pathologically enlarged axillary, mediastinal or hilar lymph nodes. Lungs/Pleura: No pneumothorax. No pleural effusion. Right lower lobe solid 4 mm pulmonary nodule (series 407/ image 57). No acute consolidative airspace disease or lung masses. Mosaic attenuation throughout both lungs is almost certainly due to mosaic profusion due to pulmonary vascular disease. Upper abdomen: Unremarkable. Musculoskeletal: No aggressive appearing focal osseous lesions. Mild-to-moderate degenerative changes in the thoracic spine. Review of the MIP images confirms the above findings. IMPRESSION: 1. Acute large burden bilateral pulmonary embolism involving lobar, segmental and subsegmental branches of all lung lobes. No saddle pulmonary emboli. 2. New dilation of the main pulmonary artery indicating acute pulmonary arterial hypertension. 3. Evidence of right heart strain as described. 4. Right lower lobe 4 mm pulmonary nodule. If the patient is at high risk for bronchogenic carcinoma, follow-up chest CT at 1 year is recommended. If the patient is at low risk, no follow-up is needed. This recommendation follows the consensus statement: Guidelines for Management of Small Pulmonary Nodules  Detected on CT Scans: A Statement from the [employer] as published in Radiology 2005; 237:395-400. Critical Value/emergent results were called by telephone at the time of interpretation on 05/27/2015 at 7:42 pm to Dr. Zenovia Jarred , who verbally acknowledged these results. Electronically Signed: By: Ilona Sorrel M.D. On: 05/27/2015 19:44   I have personally reviewed and evaluated these images and lab results as part of my medical decision-making.   EKG Interpretation   Date/Time:  Saturday May 27 2015 15:21:58 EST Ventricular Rate:  87 PR Interval:  178 QRS Duration: 90 QT Interval:  351 QTC Calculation: 422 R Axis:   69 Text Interpretation:  Sinus rhythm Atrial premature complex Borderline T  wave abnormalities no acute ischemia Confirmed by Gerald Leitz  [phone]) on 05/27/2015 3:23:59 PM      MDM   Final diagnoses:  None    Patient is a 65 year old male with morbid obesity, recent admission for septic shock from cellulitis. Patient  is presenting with syncope and hypoxia and acute shortness of breath starting today. Patient reports exercise intolerance acutely.  No chest pain. Reports he is unable to catch his breath after short walks which is different than yesterday. Neighbor witnessed syncope. We will workup for sepsis, including UA, CT angio  for pulmonary embolism.   CRITICAL CARE Performed by: Gardiner Sleeper Total critical care time: 60 minutes Critical care time was exclusive of separately billable procedures and treating other patients. Critical care was necessary to treat or prevent imminent or life-threatening deterioration. Critical care was time spent personally by me on the following activities: development of treatment plan with patient and/or surrogate as well as nursing, discussions with consultants, evaluation of patient's response to treatment, examination of patient, obtaining history from patient or surrogate, ordering and performing  treatments and interventions, ordering and review of laboratory studies, ordering and review of radiographic studies, pulse oximetry and re-evaluation of patient's condition.   Patient reassessed several times for vital sign abnormalities.  9:03 PM Large burden of clot on PE study. Patient still hemodynamically stable. Consulted crit care. Will admit overnight to critical care.   Kimbria Camposano Julio Alm, MD 05/27/15 2103

## 2015-05-27 NOTE — ED Notes (Signed)
Pt returns from xray

## 2015-05-27 NOTE — Progress Notes (Signed)
ANTICOAGULATION CONSULT NOTE - Initial Consult  Pharmacy Consult for Heparin Indication: pulmonary embolus  No Known Allergies  Patient Measurements: Height: 6' (182.9 cm) Weight: (!) 330 lb 11 oz (150 kg) IBW/kg (Calculated) : 77.6 Heparin Dosing Weight: 113 kg  Vital Signs: Temp: 98.2 F (36.8 C) (02/18 1528) BP: 107/58 mmHg (02/18 1957) Pulse Rate: 110 (02/18 1957)  Labs:  Recent Labs  05/27/15 1655  HGB 8.9*  HCT 29.6*  PLT 265  LABPROT 16.4*  INR 1.31  CREATININE 1.48*    Estimated Creatinine Clearance: 78 mL/min (by C-G formula based on Cr of 1.48).   Medical History: Past Medical History  Diagnosis Date  . GERD (gastroesophageal reflux disease)     Medications:   (Not in a hospital admission) Scheduled:  Infusions:   Assessment: 65yo male presents with SOB and near syncopal event. Pharmacy is consulted to dose heparin for PE. Acute large burden d/l PE involving lobar, segmental and subsegmental branches of all lobes.  Goal of Therapy:  Heparin level 0.3-0.7 units/ml Monitor platelets by anticoagulation protocol: Yes   Plan:  Give 6000 units bolus x 1 Start heparin infusion at 2000 units/hr Check anti-Xa level in 6 hours and daily while on heparin Continue to monitor H&H and platelets  Andrey Cota. Diona Foley, PharmD, [place] Clinical Pharmacist Pager [phone] 05/27/2015,8:06 PM

## 2015-05-27 NOTE — H&P (Signed)
PULMONARY / CRITICAL CARE MEDICINE   Name: Upton Kostecki MRN: [id number] DOB: [birth year]    ADMISSION DATE:  05/27/2015 CONSULTATION DATE:  05/27/15  REFERRING MD:  Dr. Thomasene Lot  CHIEF COMPLAINT:  PE  HISTORY OF PRESENT ILLNESS:   Mr. Velador is a 65 y/o man with recent hospitalization for septic shock likely secondary to LE cellulitis.  He was discharged on 05/23/15.  He had been doing well since his discharge until the afternoon of admission when he noted that he was acutely short of breath after walking from his chair in the living room to the kitchen.  He became acutely short of breath again after answering the door (a neighbor who was bringing him a plate of food) and had a syncopal episode.  The neighbor then called EMS.  He was hypoxic when EMS arrive (83% on RA per nursing note).  He was placed on nasal canula and given some IV fluids.  In the ED a CTA was done which showed bilateral PEs in the segmental arteries.  He has not had any further syncope.  He is now satting well on 2-3L of O2 by nasal canula.  He is not hypertensive or tachycardic.  He does have a history of a-fib and is not currently on any medications although he was on amiodarone while hospitalized.  He also has a history of significant depression and was started on SSRI by psychiatry during his previous admission.    Of note he does report that he noticed his legs starting to swell more the day prior to admission although they are swollen at baseline.  He has not had any increased leg pain and denies any chest pain.   PAST MEDICAL HISTORY :  He  has a past medical history of GERD (gastroesophageal reflux disease).  Obesity LE cellulitis   PAST SURGICAL HISTORY: He  has past surgical history that includes Cataract extraction.  No Known Allergies  No current facility-administered medications on file prior to encounter.   Current Outpatient Prescriptions on File Prior to Encounter  Medication Sig  . amiodarone  (PACERONE) 200 MG tablet Take 1 tablet (200 mg total) by mouth daily. (Patient not taking: Reported on 05/27/2015)  . bisacodyl (DULCOLAX) 10 MG suppository Place 1 suppository (10 mg total) rectally daily as needed for moderate constipation. (Patient not taking: Reported on 05/27/2015)  . docusate sodium (COLACE) 100 MG capsule Take 1 capsule (100 mg total) by mouth daily. (Patient not taking: Reported on 05/27/2015)  . FLUoxetine (PROZAC) 20 MG capsule Take 1 capsule (20 mg total) by mouth daily. (Patient not taking: Reported on 05/27/2015)  . hydrocerin (EUCERIN) CREA Apply 1 application topically daily. (Patient not taking: Reported on 05/27/2015)  . mupirocin ointment (BACTROBAN) 2 % Place 1 application into the nose 2 (two) times daily. (Patient not taking: Reported on 05/27/2015)  . ondansetron (ZOFRAN ODT) 4 MG disintegrating tablet Take 1 tablet (4 mg total) by mouth every 8 (eight) hours as needed for nausea or vomiting. (Patient not taking: Reported on 05/27/2015)  . traZODone (DESYREL) 50 MG tablet Take 1 tablet (50 mg total) by mouth at bedtime. (Patient not taking: Reported on 05/27/2015)    FAMILY HISTORY:  Father with advanced dementia  SOCIAL HISTORY: He  reports that he has never smoked. He does not have any smokeless tobacco history on file. He reports that he does not drink alcohol or use illicit drugs.  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  REVIEW OF SYSTEMS:   A review of 14 systems was negative except as stated in the HPI.   SUBJECTIVE:  65 y/o man admitted with submassive PE after syncopal episode and acute onset of shortness of breath.  VITAL SIGNS: BP 92/56 mmHg  Pulse 75  Temp(Src) 98.2 F (36.8 C)  Resp 19  Ht 6' (1.829 m)  Wt 150 kg (330 lb 11 oz)  BMI 44.84 kg/m2  SpO2 100%  HEMODYNAMICS:    VENTILATOR SETTINGS:    INTAKE / OUTPUT:    PHYSICAL EXAMINATION: Physical Exam  Constitutional: He is oriented to person, place,  and time. He appears well-developed and well-nourished.  HENT:  Head: Normocephalic and atraumatic.  Nose: Nose normal.  Mouth/Throat: Oropharynx is clear and moist.  Eyes: Conjunctivae and EOM are normal. Pupils are equal, round, and reactive to light.  Neck: Normal range of motion. Neck supple.  Cardiovascular: Intact distal pulses.   Irregularly irregular. Rate 70-90.  No MRG.   Pulmonary/Chest: Effort normal and breath sounds normal. No stridor.  Becomes short of breath with conversation.   Abdominal: Soft. Bowel sounds are normal. He exhibits no distension. There is no tenderness. There is no guarding.  Musculoskeletal:  LE swelling bilaterally.  Both legs dressed in ace bandages.  Neurological: He is alert and oriented to person, place, and time. He has normal reflexes.  Skin: Skin is warm and dry.  Psychiatric: He has a normal mood and affect. His behavior is normal.     LABS:  BMET  Recent Labs Lab 05/21/15 0345 05/27/15 1655  NA 142 141  K 3.9 4.0  CL 110 107  CO2 22 23  BUN 10 15  CREATININE 1.27* 1.48*  GLUCOSE 96 103*    Electrolytes  Recent Labs Lab 05/21/15 0345 05/27/15 1655  CALCIUM 9.3 9.7    CBC  Recent Labs Lab 05/21/15 0345 05/27/15 1655  WBC 5.4 5.6  HGB 8.2* 8.9*  HCT 26.9* 29.6*  PLT 203 265    Coag's  Recent Labs Lab 05/27/15 1655  INR 1.31    Sepsis Markers  Recent Labs Lab 05/27/15 1711  LATICACIDVEN 0.96    ABG No results for input(s): PHART, PCO2ART, PO2ART in the last 168 hours.  Liver Enzymes  Recent Labs Lab 05/27/15 1655  AST 20  ALT 27  ALKPHOS 79  BILITOT 0.6  ALBUMIN 3.1*    Cardiac Enzymes No results for input(s): TROPONINI, PROBNP in the last 168 hours.  Glucose No results for input(s): GLUCAP in the last 168 hours.  Imaging Dg Chest 2 View  05/27/2015  CLINICAL DATA:  Chest pain.  Syncope today. EXAM: CHEST  2 VIEW COMPARISON:  05/10/2015 FINDINGS: The cardiomediastinal contours are  unchanged, heart at the upper limits in size. Previous left central line is no longer seen. Pulmonary vasculature is normal. No consolidation, pleural effusion, or pneumothorax. No acute osseous abnormalities are seen. IMPRESSION: No acute pulmonary process. Electronically Signed   By: Jeb Levering M.D.   On: 05/27/2015 18:00   Ct Angio Chest Pe W/cm &/or Wo Cm  05/27/2015  ADDENDUM REPORT: 05/27/2015 19:53 ADDENDUM: Positive for acute PE with CT evidence of right heart strain (RV/LV Ratio = 1.17) consistent with at least submassive (intermediate risk) PE. The presence of right heart strain has been associated with an increased risk of morbidity and mortality. Please activate Code PE by paging [phone]. Electronically Signed   By: Ilona Sorrel M.D.   On: 05/27/2015 19:53  05/27/2015  CLINICAL DATA:  Hypoxia.  Syncope.  Shortness of breath. EXAM: CT ANGIOGRAPHY CHEST WITH CONTRAST TECHNIQUE: Multidetector CT imaging of the chest was performed using the standard protocol during bolus administration of intravenous contrast. Multiplanar CT image reconstructions and MIPs were obtained to evaluate the vascular anatomy. CONTRAST:  198mL OMNIPAQUE IOHEXOL 350 MG/ML SOLN COMPARISON:  No prior chest CT. Chest radiograph from earlier today. 05/12/2015 CT abdomen. FINDINGS: Mediastinum/Nodes: The study is high quality for the evaluation of pulmonary embolism. There is a large burden of acute pulmonary embolism throughout the bilateral lobar, segmental and subsegmental pulmonary arterial tree involving all lung lobes. No main pulmonary artery or saddle pulmonary emboli. There is new dilatation of the main pulmonary artery (3.8 cm). Atherosclerotic nonaneurysmal thoracic aorta. There is new dilatation of the right ventricle and right atrium, with slight reflux of contrast into the intrahepatic IVC and hepatic veins. No significant pericardial fluid/thickening. Normal visualized thyroid. Normal esophagus. No  pathologically enlarged axillary, mediastinal or hilar lymph nodes. Lungs/Pleura: No pneumothorax. No pleural effusion. Right lower lobe solid 4 mm pulmonary nodule (series 407/ image 57). No acute consolidative airspace disease or lung masses. Mosaic attenuation throughout both lungs is almost certainly due to mosaic profusion due to pulmonary vascular disease. Upper abdomen: Unremarkable. Musculoskeletal: No aggressive appearing focal osseous lesions. Mild-to-moderate degenerative changes in the thoracic spine. Review of the MIP images confirms the above findings. IMPRESSION: 1. Acute large burden bilateral pulmonary embolism involving lobar, segmental and subsegmental branches of all lung lobes. No saddle pulmonary emboli. 2. New dilation of the main pulmonary artery indicating acute pulmonary arterial hypertension. 3. Evidence of right heart strain as described. 4. Right lower lobe 4 mm pulmonary nodule. If the patient is at high risk for bronchogenic carcinoma, follow-up chest CT at 1 year is recommended. If the patient is at low risk, no follow-up is needed. This recommendation follows the consensus statement: Guidelines for Management of Small Pulmonary Nodules Detected on CT Scans: A Statement from the [place] as published in Radiology 2005; 237:395-400. Critical Value/emergent results were called by telephone at the time of interpretation on 05/27/2015 at 7:42 pm to Dr. Zenovia Jarred , who verbally acknowledged these results. Electronically Signed: By: Ilona Sorrel M.D. On: 05/27/2015 19:44     STUDIES:  CTA 2/18.  Bilateral PEs in segmental arteries.  Increased  R:L heart ratio suggestive of right heart strain.   SIGNIFICANT EVENTS: Started on heparin 2/18  LINES/TUBES: PIV  DISCUSSION: 64 y/o man with submassive PE and syncopal event prior to presentation.  HR and BP now normal.  Still requiring O2 by nasal canula (does not use oxygen at home).  With increased LE edema over the  last day and relative immobility high risk for DVT as well.  While I do not think he needs lytic therapy emergently I do think he is at significant risk for decompensation over the next 24-48 hours and think he should be monitored closely in the ICU setting.   ASSESSMENT / PLAN:  PULMONARY A: PE P:   On heparin gtt - pharmacy monitoring O2 by [place] to maintain sats >88%. If decompensates will consult VIR for EKOS vs. Consider systemic lytic therapy.  CARDIOVASCULAR A:  A-fib P:  Not currently on amiodarone or anticoagulation on admission Prescribed amiodarone at previous discharge but had not yet had filled. Will hold on any meds for now.  Currently HR is 70-90.  RENAL A:   AKI P:   Cr 1.5 on admission up  from 1.25 at d/c last week. Will follow electrolytes and kidney function.   GASTROINTESTINAL A:   GERD, Nausea P:   Zofran IV PRN for nausea Maalox PRN for GERD per pt request.  HEMATOLOGIC A:   PE - provoked P:  Will need at least 6 months anticoagulation after discharge.  Given a-fib and obesity would consider lifelong.   INFECTIOUS A:   H/O LE cellulitis P:   Wound care consult placed for continued care of leg wounds.    FAMILY  - Updates: No family at bedside.  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   - Inter-disciplinary family meet or Palliative Care meeting due by: day 7   I spent 35 minutes of critical care time in the care of this patient separate from procedures which are documented elsewhere   Reginia Forts MD, PhD Pulmonary and [place] Pager: [phone]  05/27/2015, 9:09 PM

## 2015-05-28 ENCOUNTER — Other Ambulatory Visit (HOSPITAL_COMMUNITY): Payer: Self-pay

## 2015-05-28 ENCOUNTER — Encounter (HOSPITAL_COMMUNITY): Payer: Self-pay

## 2015-05-28 DIAGNOSIS — I2699 Other pulmonary embolism without acute cor pulmonale: Principal | ICD-10-CM

## 2015-05-28 DIAGNOSIS — E669 Obesity, unspecified: Secondary | ICD-10-CM

## 2015-05-28 LAB — BASIC METABOLIC PANEL
Anion gap: 6 (ref 5–15)
BUN: 14 mg/dL (ref 6–20)
CALCIUM: 9 mg/dL (ref 8.9–10.3)
CO2: 25 mmol/L (ref 22–32)
Chloride: 110 mmol/L (ref 101–111)
Creatinine, Ser: 1.48 mg/dL — ABNORMAL HIGH (ref 0.61–1.24)
GFR calc Af Amer: 57 mL/min — ABNORMAL LOW (ref >60)
GFR, EST NON AFRICAN AMERICAN: 49 mL/min — AB (ref >60)
GLUCOSE: 99 mg/dL (ref 65–99)
POTASSIUM: 3.5 mmol/L (ref 3.5–5.1)
Sodium: 141 mmol/L (ref 135–145)

## 2015-05-28 LAB — HEPARIN LEVEL (UNFRACTIONATED)
HEPARIN UNFRACTIONATED: 0.26 [IU]/mL — AB (ref 0.30–0.70)
Heparin Unfractionated: 0.4 IU/mL (ref 0.30–0.70)
Heparin Unfractionated: 0.31 IU/mL (ref 0.30–0.70)

## 2015-05-28 LAB — CBC
HEMATOCRIT: 27.3 % — AB (ref 39.0–52.0)
Hemoglobin: 8.2 g/dL — ABNORMAL LOW (ref 13.0–17.0)
MCH: 30.4 pg (ref 26.0–34.0)
MCHC: 30 g/dL (ref 30.0–36.0)
MCV: 101.1 fL — AB (ref 78.0–100.0)
PLATELETS: 236 10*3/uL (ref 150–400)
RBC: 2.7 MIL/uL — ABNORMAL LOW (ref 4.22–5.81)
RDW: 15.8 % — AB (ref 11.5–15.5)
WBC: 5.3 10*3/uL (ref 4.0–10.5)

## 2015-05-28 LAB — MRSA PCR SCREENING: MRSA by PCR: POSITIVE — AB

## 2015-05-28 MED ORDER — HEPARIN BOLUS VIA INFUSION
1000.0000 [IU] | Freq: Once | INTRAVENOUS | Status: AC
  Administered 2015-05-28: 1000 [IU] via INTRAVENOUS
  Filled 2015-05-28: qty 1000

## 2015-05-28 MED ORDER — CHLORHEXIDINE GLUCONATE CLOTH 2 % EX PADS
6.0000 | MEDICATED_PAD | Freq: Every day | CUTANEOUS | Status: AC
  Administered 2015-05-28 – 2015-06-01 (×5): 6 via TOPICAL

## 2015-05-28 MED ORDER — AMIODARONE HCL 200 MG PO TABS
200.0000 mg | ORAL_TABLET | Freq: Every day | ORAL | Status: DC
  Administered 2015-05-28 – 2015-06-02 (×6): 200 mg via ORAL
  Filled 2015-05-28 (×6): qty 1

## 2015-05-28 MED ORDER — CHLORHEXIDINE GLUCONATE 0.12 % MT SOLN
15.0000 mL | Freq: Two times a day (BID) | OROMUCOSAL | Status: DC
  Administered 2015-05-28 – 2015-06-02 (×9): 15 mL via OROMUCOSAL
  Filled 2015-05-28 (×8): qty 15

## 2015-05-28 MED ORDER — MUPIROCIN 2 % EX OINT
1.0000 "application " | TOPICAL_OINTMENT | Freq: Two times a day (BID) | CUTANEOUS | Status: AC
  Administered 2015-05-28 – 2015-06-01 (×9): 1 via NASAL
  Filled 2015-05-28 (×4): qty 22

## 2015-05-28 NOTE — Progress Notes (Signed)
Chaplain responded to request from patient for spiritual care support.  Patient appeared positive, articulate and polite. He stated that he felt appreciative to God for so many things in his life, but admitted to feeling pressed by his current situation. He said that he had a lot to say and it would take a long time; "Where do you want me to start?"  Patient discussed family conflict between his two older brothers and himself including financial disagreements and tensions.  Father is a WWII veteran who was recently placed in long-term care but has some dementia.  Patient admitted that he hadn't taken care of himself because he was focused on caring for his wife of 28 years who died in 08/03/09 and his father. When pressed to focus, he said his greatest prayer was that "God doesn't forget me". He admitted to chaplain feelings of loneliness and lack of support. Chaplain encouraged patient to seek out connections to a church and to focus on working towards better health. Prayer and faith are very important to patient. Chaplain provided positive, emotional regard, prayer, Bible reading, and a Bible for patient to keep and chaplain facilitated storytelling. Patient appreciates Bible reading but can't read himself because he doesn't have his reading glasses.    Chaplain will recommend f/u to floor chaplain. Please call as needed for support.    Luana Shu [phone]    05/28/15 2098-08-03  Clinical Encounter Type  Visited With Patient  Visit Type Initial;Spiritual support;Psychological support  Referral From Patient  Consult/Referral To Chaplain  Spiritual Encounters  Spiritual Needs Prayer;Literature;Emotional  Stress Factors  Patient Stress Factors Family relationships;Financial concerns;Lack of caregivers (lack of social network/support)

## 2015-05-28 NOTE — Progress Notes (Signed)
Utilization Review Completed.Scott Shelton T2/19/2017  

## 2015-05-28 NOTE — Progress Notes (Signed)
ANTICOAGULATION CONSULT NOTE - Follow Up Consult  Pharmacy Consult for heparin Indication: bilateral PEs  No Known Allergies  Patient Measurements: Height: 6' (182.9 cm) Weight: (!) 333 lb 8.9 oz (151.3 kg) IBW/kg (Calculated) : 77.6 Heparin Dosing Weight: 113 kg  Vital Signs: Temp: 97.7 F (36.5 C) (02/19 1227) Temp Source: Oral (02/19 1227) BP: 114/59 mmHg (02/19 1045) Pulse Rate: 72 (02/19 1045)  Labs:  Recent Labs  05/27/15 1655 05/28/15 0450 05/28/15 1219  HGB 8.9* 8.2*  --   HCT 29.6* 27.3*  --   PLT 265 236  --   LABPROT 16.4*  --   --   INR 1.31  --   --   HEPARINUNFRC  --  0.40 0.31  CREATININE 1.48* 1.48*  --     Estimated Creatinine Clearance: 78.4 mL/min (by C-G formula based on Cr of 1.48).   Assessment: 65 yo m admitted with bilateral PEs.  Pharmacy is consulted to dose heparin infusion.  HL this AM was therapeutic at 0.40, repeat HL this afternoon is within range but at lower end at 0.31 on 2000 units/hr. With size of patient and indication of bilateral PEs, will increase infusion slightly to make sure he does not go subtherapeutic.  CBC ok, hgb 8.2- stable, plts 236. No issues per CCM RN.   Goal of Therapy:  Heparin level 0.3-0.7 units/ml Monitor platelets by anticoagulation protocol: Yes   Plan:  - Increase heparin infusion to 2100 units/hr - 6-hr HL @ 2000 - Daily HL, CBC - F/u plans for long term anticoagulation  Jameila Keeny L. Nicole Kindred, PharmD PGY2 Infectious Diseases Pharmacy Resident Pager: [phone] 05/28/2015 1:42 PM

## 2015-05-28 NOTE — Progress Notes (Signed)
ANTICOAGULATION CONSULT NOTE - Follow Up Consult  Pharmacy Consult for Heparin  Indication: pulmonary embolus  No Known Allergies  Patient Measurements: Height: 6' (182.9 cm) Weight: (!) 333 lb 8.9 oz (151.3 kg) IBW/kg (Calculated) : 77.6  Vital Signs: Temp: 97.4 F (36.3 C) (02/19 0356) Temp Source: Oral (02/19 0356) BP: 105/56 mmHg (02/19 0230) Pulse Rate: 71 (02/19 0230)  Labs:  Recent Labs  05/27/15 1655 05/28/15 0450  HGB 8.9* 8.2*  HCT 29.6* 27.3*  PLT 265 236  LABPROT 16.4*  --   INR 1.31  --   HEPARINUNFRC  --  0.40  CREATININE 1.48*  --     Estimated Creatinine Clearance: 78.4 mL/min (by C-G formula based on Cr of 1.48).   Assessment: Heparin for new onset PE, initial heparin level is therapeutic at 0.4  Goal of Therapy:  Heparin level 0.3-0.7 units/ml Monitor platelets by anticoagulation protocol: Yes   Plan:  -Continue heparin at 2000 units/hr -Confirmatory HL at 1200  -Trend Hgb  Narda Bonds 05/28/2015,5:32 AM

## 2015-05-28 NOTE — Progress Notes (Signed)
Name: Scott Shelton MRN: [id number] DOB: [birth year]    ADMISSION DATE:  05/27/2015 CONSULTATION DATE:  05/28/15  REFERRING MD :  EDP  CHIEF COMPLAINT:  PE   SUBJECTIVE:  Remains on 3L /[place]  VITAL SIGNS: Temp:  [97.4 F (36.3 C)-98.2 F (36.8 C)] 98 F (36.7 C) (02/19 0817) Pulse Rate:  [66-110] 77 (02/19 0915) Resp:  [13-27] 18 (02/19 0915) BP: (79-114)/(27-88) 96/48 mmHg (02/19 0915) SpO2:  [83 %-100 %] 100 % (02/19 0915) Weight:  [330 lb 11 oz (150 kg)-334 lb 7 oz (151.7 kg)] 333 lb 8.9 oz (151.3 kg) (02/19 0500)  PHYSICAL EXAMINATION: General:  Obese male in NAD  Neuro:  AAOx4, speech clear, MAE HEENT:  Mm pink/moist, short / thick neck, unable to appreciate JVD Cardiovascular:  s1s2 rrr, no m/r/g Lungs:  Even/non-labored, lungs bilaterally clear Abdomen:  Obese/soft, bsx4 active  Musculoskeletal:  No acute deformities Skin:  BLE wrapped with ACE, edematous / erythematous   Recent Labs Lab 05/27/15 1655 05/28/15 0450  NA 141 141  K 4.0 3.5  CL 107 110  CO2 23 25  BUN 15 14  CREATININE 1.48* 1.48*  GLUCOSE 103* 99    Recent Labs Lab 05/27/15 1655 05/28/15 0450  HGB 8.9* 8.2*  HCT 29.6* 27.3*  WBC 5.6 5.3  PLT 265 236   Dg Chest 2 View  05/27/2015  CLINICAL DATA:  Chest pain.  Syncope today. EXAM: CHEST  2 VIEW COMPARISON:  05/10/2015 FINDINGS: The cardiomediastinal contours are unchanged, heart at the upper limits in size. Previous left central line is no longer seen. Pulmonary vasculature is normal. No consolidation, pleural effusion, or pneumothorax. No acute osseous abnormalities are seen. IMPRESSION: No acute pulmonary process. Electronically Signed   By: Jeb Levering M.D.   On: 05/27/2015 18:00   Ct Angio Chest Pe W/cm &/or Wo Cm  05/27/2015  ADDENDUM REPORT: 05/27/2015 19:53 ADDENDUM: Positive for acute PE with CT evidence of right heart strain (RV/LV Ratio = 1.17) consistent with at least submassive (intermediate risk) PE. The presence of right  heart strain has been associated with an increased risk of morbidity and mortality. Please activate Code PE by paging [phone]. Electronically Signed   By: Ilona Sorrel M.D.   On: 05/27/2015 19:53  05/27/2015  CLINICAL DATA:  Hypoxia.  Syncope.  Shortness of breath. EXAM: CT ANGIOGRAPHY CHEST WITH CONTRAST TECHNIQUE: Multidetector CT imaging of the chest was performed using the standard protocol during bolus administration of intravenous contrast. Multiplanar CT image reconstructions and MIPs were obtained to evaluate the vascular anatomy. CONTRAST:  189mL OMNIPAQUE IOHEXOL 350 MG/ML SOLN COMPARISON:  No prior chest CT. Chest radiograph from earlier today. 05/12/2015 CT abdomen. FINDINGS: Mediastinum/Nodes: The study is high quality for the evaluation of pulmonary embolism. There is a large burden of acute pulmonary embolism throughout the bilateral lobar, segmental and subsegmental pulmonary arterial tree involving all lung lobes. No main pulmonary artery or saddle pulmonary emboli. There is new dilatation of the main pulmonary artery (3.8 cm). Atherosclerotic nonaneurysmal thoracic aorta. There is new dilatation of the right ventricle and right atrium, with slight reflux of contrast into the intrahepatic IVC and hepatic veins. No significant pericardial fluid/thickening. Normal visualized thyroid. Normal esophagus. No pathologically enlarged axillary, mediastinal or hilar lymph nodes. Lungs/Pleura: No pneumothorax. No pleural effusion. Right lower lobe solid 4 mm pulmonary nodule (series 407/ image 57). No acute consolidative airspace disease or lung masses. Mosaic attenuation throughout both lungs is almost certainly due to mosaic profusion  due to pulmonary vascular disease. Upper abdomen: Unremarkable. Musculoskeletal: No aggressive appearing focal osseous lesions. Mild-to-moderate degenerative changes in the thoracic spine. Review of the MIP images confirms the above findings. IMPRESSION: 1. Acute large  burden bilateral pulmonary embolism involving lobar, segmental and subsegmental branches of all lung lobes. No saddle pulmonary emboli. 2. New dilation of the main pulmonary artery indicating acute pulmonary arterial hypertension. 3. Evidence of right heart strain as described. 4. Right lower lobe 4 mm pulmonary nodule. If the patient is at high risk for bronchogenic carcinoma, follow-up chest CT at 1 year is recommended. If the patient is at low risk, no follow-up is needed. This recommendation follows the consensus statement: Guidelines for Management of Small Pulmonary Nodules Detected on CT Scans: A Statement from the [place] as published in Radiology 2005; 237:395-400. Critical Value/emergent results were called by telephone at the time of interpretation on 05/27/2015 at 7:42 pm to Dr. Zenovia Jarred , who verbally acknowledged these results. Electronically Signed: By: Ilona Sorrel M.D. On: 05/27/2015 19:44    STUDIES:  CTA 2/18 >> acute large burden bilateral PE,  Increased R:L heart ratio suggestive of right heart strain. 4 mm RLL nodule  SIGNIFICANT EVENTS: 2/18  Admit with syncope, hypoxia (new), found to have PE.  Started on heparin gtt  LINES/TUBES: PIV  DISCUSSION: 65 y/o man with PMH of recent admit for septic shock (1/31-2/14) in the setting of RLE cellulitis & syncope.  Re-admitted 2/18 with submassive PE and syncopal event prior to presentation. Still requiring O2 by nasal canula (does not use oxygen at home). With increased LE edema over the last day and relative immobility high risk for DVT as well.     ASSESSMENT / PLAN:  PULMONARY A: Submassive PE 4 mm RLL Pulmonary nodule  Acute Hypoxic Respiratory Failure in setting of PE  P:  Heparin gtt per pharmacy, appreciate assisatance O2 by [place] to maintain sats >88%. If decompensation will consult IR for EKOS vs. Consider systemic lytic therapy. Will need follow up for pulmonary nodule  Keep in ICU  overnight, if remains stable, consider transfer out after ECHO review   CARDIOVASCULAR A:  Atrial Fibrillation - dx last admit, d/c on amiodarone but did not fill P:  Resume amiodarone 2/19 ICU monitoring of hemodynamics Heparin gtt as above Case management consult for medication assistance.   RENAL A:  AKI - Cr 1.5 on admission up from 1.25 at d/c last week. P:  Trend BMP / UOP Replace electrolytes as indicated   GASTROINTESTINAL A:  GERD Nausea Morbid Obesity  P:  Zofran IV PRN for nausea Maalox PRN for GERD   HEMATOLOGIC A:  PE - provoked P:  Will need at least 6 months anticoagulation after discharge. Given a-fib and obesity would consider lifelong.  Consult case mgmt for medication assistance - he does not have a car or reliable transportation.  He will need anticoagulation in hand if possible before discharge.    INFECTIOUS A:  H/O LE cellulitis Hx MRSA  P:  Wound care consult placed for continued care of leg wounds.    FAMILY  - Updates: No family at bedside. 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    - Inter-disciplinary family meet or Palliative Care meeting due by: day [place], NP-C [place] [facility] Pgr: [phone] or if no answer [phone] 05/28/2015, 10:00 AM

## 2015-05-28 NOTE — Progress Notes (Signed)
ANTICOAGULATION CONSULT NOTE - Follow Up Consult  Pharmacy Consult for heparin Indication: bilateral PEs  No Known Allergies  Patient Measurements: Height: 6' (182.9 cm) Weight: (!) 333 lb 8.9 oz (151.3 kg) IBW/kg (Calculated) : 77.6 Heparin Dosing Weight: 113 kg  Vital Signs: Temp: 97.9 F (36.6 C) (02/19 1941) Temp Source: Oral (02/19 1941) BP: 93/57 mmHg (02/19 2015) Pulse Rate: 70 (02/19 2015)  Labs:  Recent Labs  05/27/15 1655 05/28/15 0450 05/28/15 1219 05/28/15 2006  HGB 8.9* 8.2*  --   --   HCT 29.6* 27.3*  --   --   PLT 265 236  --   --   LABPROT 16.4*  --   --   --   INR 1.31  --   --   --   HEPARINUNFRC  --  0.40 0.31 0.26*  CREATININE 1.48* 1.48*  --   --     Estimated Creatinine Clearance: 78.4 mL/min (by C-G formula based on Cr of 1.48).   Assessment: 65 yo m admitted with bilateral PEs.  Pharmacy is consulted to dose heparin infusion.   -Heparin level is 0.26 after increase to 2100 units/hr  Goal of Therapy:   Heparin level 0.3-0.7 units/ml Monitor platelets by anticoagulation protocol: Yes   Plan: -Heparin bolus 1000 units and increase to 2300 units/hr -Heparin level in 6 hrs - Daily HL, CBC - F/u plans for long term anticoagulation  Hildred Laser, Pharm D 05/28/2015 9:13 PM

## 2015-05-29 ENCOUNTER — Inpatient Hospital Stay (HOSPITAL_COMMUNITY): Payer: Self-pay

## 2015-05-29 ENCOUNTER — Inpatient Hospital Stay: Payer: Self-pay

## 2015-05-29 DIAGNOSIS — I480 Paroxysmal atrial fibrillation: Secondary | ICD-10-CM

## 2015-05-29 DIAGNOSIS — N289 Disorder of kidney and ureter, unspecified: Secondary | ICD-10-CM

## 2015-05-29 DIAGNOSIS — I8311 Varicose veins of right lower extremity with inflammation: Secondary | ICD-10-CM

## 2015-05-29 DIAGNOSIS — I2699 Other pulmonary embolism without acute cor pulmonale: Secondary | ICD-10-CM

## 2015-05-29 DIAGNOSIS — I8312 Varicose veins of left lower extremity with inflammation: Secondary | ICD-10-CM

## 2015-05-29 LAB — CBC
HEMATOCRIT: 26.9 % — AB (ref 39.0–52.0)
Hemoglobin: 8.1 g/dL — ABNORMAL LOW (ref 13.0–17.0)
MCH: 30.6 pg (ref 26.0–34.0)
MCHC: 30.1 g/dL (ref 30.0–36.0)
MCV: 101.5 fL — ABNORMAL HIGH (ref 78.0–100.0)
PLATELETS: 213 10*3/uL (ref 150–400)
RBC: 2.65 MIL/uL — ABNORMAL LOW (ref 4.22–5.81)
RDW: 15.8 % — AB (ref 11.5–15.5)
WBC: 6.1 10*3/uL (ref 4.0–10.5)

## 2015-05-29 LAB — HEPARIN LEVEL (UNFRACTIONATED): HEPARIN UNFRACTIONATED: 0.62 [IU]/mL (ref 0.30–0.70)

## 2015-05-29 LAB — BASIC METABOLIC PANEL
ANION GAP: 8 (ref 5–15)
BUN: 13 mg/dL (ref 6–20)
CALCIUM: 9.4 mg/dL (ref 8.9–10.3)
CO2: 24 mmol/L (ref 22–32)
Chloride: 108 mmol/L (ref 101–111)
Creatinine, Ser: 1.35 mg/dL — ABNORMAL HIGH (ref 0.61–1.24)
GFR, EST NON AFRICAN AMERICAN: 55 mL/min — AB (ref >60)
Glucose, Bld: 105 mg/dL — ABNORMAL HIGH (ref 65–99)
Potassium: 3.4 mmol/L — ABNORMAL LOW (ref 3.5–5.1)
SODIUM: 140 mmol/L (ref 135–145)

## 2015-05-29 MED ORDER — HYDROCERIN EX CREA: TOPICAL_CREAM | Freq: Two times a day (BID) | CUTANEOUS | Status: DC

## 2015-05-29 MED ORDER — HYDROCERIN EX CREA: TOPICAL_CREAM | Freq: Every day | CUTANEOUS | Status: DC

## 2015-05-29 MED ORDER — HYDROCERIN EX CREA
TOPICAL_CREAM | CUTANEOUS | Status: DC
  Administered 2015-05-29: 12:00:00 via TOPICAL
  Filled 2015-05-29 (×3): qty 113

## 2015-05-29 MED ORDER — POTASSIUM CHLORIDE CRYS ER 20 MEQ PO TBCR
40.0000 meq | EXTENDED_RELEASE_TABLET | Freq: Once | ORAL | Status: AC
  Administered 2015-05-29: 40 meq via ORAL

## 2015-05-29 NOTE — Progress Notes (Signed)
eLink Physician-Brief Progress Note Patient Name: Artemis Carpenito DOB: October 21, 1950 MRN: [id number]   Date of Service  05/29/2015  HPI/Events of Note  K low.  eICU Interventions  Give K.     Intervention Category Major Interventions: Other:  Lennie Dunnigan 05/29/2015, 3:43 AM

## 2015-05-29 NOTE — Progress Notes (Signed)
ANTICOAGULATION CONSULT NOTE - Follow Up Consult  Pharmacy Consult for heparin Indication: bilateral PEs  No Known Allergies  Patient Measurements: Height: 6' (182.9 cm) Weight: (!) 332 lb 14.3 oz (151 kg) IBW/kg (Calculated) : 77.6 Heparin Dosing Weight: 113 kg  Vital Signs: Temp: 98.7 F (37.1 C) (02/20 0350) Temp Source: Oral (02/20 0350) BP: 112/62 mmHg (02/20 0630) Pulse Rate: 65 (02/20 0630)  Labs:  Recent Labs  05/27/15 1655  05/28/15 0450 05/28/15 1219 05/28/15 2006 05/29/15 0232  HGB 8.9*  --  8.2*  --   --  8.1*  HCT 29.6*  --  27.3*  --   --  26.9*  PLT 265  --  236  --   --  213  LABPROT 16.4*  --   --   --   --   --   INR 1.31  --   --   --   --   --   HEPARINUNFRC  --   < > 0.40 0.31 0.26* 0.62  CREATININE 1.48*  --  1.48*  --   --  1.35*  < > = values in this interval not displayed.  Estimated Creatinine Clearance: 85.9 mL/min (by C-G formula based on Cr of 1.35).   Assessment: 65 yo m admitted with SOB and syncope, CTA showed bilateral submassive PE with R heart strain.  Therapeutic on heparin at 2300 units/hr. Hgb down to 8.1, looks like pt has had hgb in 8 range during this admission but historically has higher hgb.  Goal of Therapy:  Heparin level 0.3-0.7 units/ml Monitor platelets by anticoagulation protocol: Yes   Plan: -Continue heparin at 2300 units/hr -Daily HL, CBC -F/u plans for long term anticoagulation    Hughes Better, PharmD, BCPS Clinical Pharmacist Pager: [phone] 05/29/2015 7:48 AM

## 2015-05-29 NOTE — Progress Notes (Signed)
UR Completed. Mikhai Bienvenue, RN, BSN.  [phone] 

## 2015-05-29 NOTE — Progress Notes (Signed)
  Echocardiogram 2D Echocardiogram has been performed.  Donata Clay 05/29/2015, 11:39 AM

## 2015-05-29 NOTE — Progress Notes (Signed)
Name: Scott Shelton MRN: [id number] DOB: 26-Dec-1950    ADMISSION DATE:  05/27/2015 CONSULTATION DATE:  05/28/15  REFERRING MD :  EDP  CHIEF COMPLAINT:  PE  65 y/o man with PMH of recent admit for septic shock (1/31-2/14) in the setting of RLE cellulitis & syncope.  Re-admitted 2/18 with submassive PE and syncopal event prior to presentation. With increased LE edema over the last day and relative immobility high risk for DVT as well.   SUBJECTIVE:  On  No dyspnea or chest pain  VITAL SIGNS: Temp:  [97.5 F (36.4 C)-98.7 F (37.1 C)] 98.2 F (36.8 C) (02/20 1202) Pulse Rate:  [64-88] 72 (02/20 1000) Resp:  [14-20] 18 (02/20 1000) BP: (90-113)/(50-84) 100/53 mmHg (02/20 1000) SpO2:  [80 %-100 %] 93 % (02/20 1000) Weight:  [151 kg (332 lb 14.3 oz)] 151 kg (332 lb 14.3 oz) (02/20 0500)  PHYSICAL EXAMINATION: General:  Obese male in NAD  Neuro:  AAOx4, speech clear, MAE HEENT:  Mm pink/moist, short / thick neck, unable to appreciate JVD Cardiovascular:  s1s2 rrr, no m/r/g Lungs:  Even/non-labored, lungs bilaterally clear Abdomen:  Obese/soft, bsx4 active  Musculoskeletal:  No acute deformities Skin:  BLE wrapped with ACE, edematous / erythematous   Recent Labs Lab 05/27/15 1655 05/28/15 0450 05/29/15 0232  NA 141 141 140  K 4.0 3.5 3.4*  CL 107 110 108  CO2 23 25 24   BUN 15 14 13   CREATININE 1.48* 1.48* 1.35*  GLUCOSE 103* 99 105*    Recent Labs Lab 05/27/15 1655 05/28/15 0450 05/29/15 0232  HGB 8.9* 8.2* 8.1*  HCT 29.6* 27.3* 26.9*  WBC 5.6 5.3 6.1  PLT 265 236 213   Dg Chest 2 View  05/27/2015  CLINICAL DATA:  Chest pain.  Syncope today. EXAM: CHEST  2 VIEW COMPARISON:  05/10/2015 FINDINGS: The cardiomediastinal contours are unchanged, heart at the upper limits in size. Previous left central line is no longer seen. Pulmonary vasculature is normal. No consolidation, pleural effusion, or pneumothorax. No acute osseous abnormalities are seen. IMPRESSION: No  acute pulmonary process. Electronically Signed   By: Jeb Levering M.D.   On: 05/27/2015 18:00   Ct Angio Chest Pe W/cm &/or Wo Cm  05/27/2015  ADDENDUM REPORT: 05/27/2015 19:53 ADDENDUM: Positive for acute PE with CT evidence of right heart strain (RV/LV Ratio = 1.17) consistent with at least submassive (intermediate risk) PE. The presence of right heart strain has been associated with an increased risk of morbidity and mortality. Please activate Code PE by paging [phone]. Electronically Signed   By: Ilona Sorrel M.D.   On: 05/27/2015 19:53  05/27/2015  CLINICAL DATA:  Hypoxia.  Syncope.  Shortness of breath. EXAM: CT ANGIOGRAPHY CHEST WITH CONTRAST TECHNIQUE: Multidetector CT imaging of the chest was performed using the standard protocol during bolus administration of intravenous contrast. Multiplanar CT image reconstructions and MIPs were obtained to evaluate the vascular anatomy. CONTRAST:  162mL OMNIPAQUE IOHEXOL 350 MG/ML SOLN COMPARISON:  No prior chest CT. Chest radiograph from earlier today. 05/12/2015 CT abdomen. FINDINGS: Mediastinum/Nodes: The study is high quality for the evaluation of pulmonary embolism. There is a large burden of acute pulmonary embolism throughout the bilateral lobar, segmental and subsegmental pulmonary arterial tree involving all lung lobes. No main pulmonary artery or saddle pulmonary emboli. There is new dilatation of the main pulmonary artery (3.8 cm). Atherosclerotic nonaneurysmal thoracic aorta. There is new dilatation of the right ventricle and right atrium, with slight reflux of contrast  into the intrahepatic IVC and hepatic veins. No significant pericardial fluid/thickening. Normal visualized thyroid. Normal esophagus. No pathologically enlarged axillary, mediastinal or hilar lymph nodes. Lungs/Pleura: No pneumothorax. No pleural effusion. Right lower lobe solid 4 mm pulmonary nodule (series 407/ image 57). No acute consolidative airspace disease or lung masses.  Mosaic attenuation throughout both lungs is almost certainly due to mosaic profusion due to pulmonary vascular disease. Upper abdomen: Unremarkable. Musculoskeletal: No aggressive appearing focal osseous lesions. Mild-to-moderate degenerative changes in the thoracic spine. Review of the MIP images confirms the above findings. IMPRESSION: 1. Acute large burden bilateral pulmonary embolism involving lobar, segmental and subsegmental branches of all lung lobes. No saddle pulmonary emboli. 2. New dilation of the main pulmonary artery indicating acute pulmonary arterial hypertension. 3. Evidence of right heart strain as described. 4. Right lower lobe 4 mm pulmonary nodule. If the patient is at high risk for bronchogenic carcinoma, follow-up chest CT at 1 year is recommended. If the patient is at low risk, no follow-up is needed. This recommendation follows the consensus statement: Guidelines for Management of Small Pulmonary Nodules Detected on CT Scans: A Statement from the [place] as published in Radiology 2005; 237:395-400. Critical Value/emergent results were called by telephone at the time of interpretation on 05/27/2015 at 7:42 pm to Dr. Zenovia Jarred , who verbally acknowledged these results. Electronically Signed: By: Ilona Sorrel M.D. On: 05/27/2015 19:44    STUDIES:  CTA 2/18 >> acute large burden bilateral PE,  Increased R:L heart ratio suggestive of right heart strain. 4 mm RLL nodule Echo >> RVSP 57  SIGNIFICANT EVENTS: 2/18  Admit with syncope, hypoxia (new), found to have PE.  Started on heparin gtt  LINES/TUBES: PIV    ASSESSMENT / PLAN:  PULMONARY A: Submassive PE 4 mm RLL Pulmonary nodule   P:  Heparin gtt per pharmacy- can transition to coumadin or NoAc , May favor Coumadin given his insurance issues Will need 39m follow up CT  for pulmonary nodule   Will need at least 6 months anticoagulation after discharge. Given a-fib and obesity would consider lifelong.   Case management consult for medication assistance.  He will need anticoagulation in hand if possible before discharge.    CARDIOVASCULAR A:  Atrial Fibrillation - dx last admit, d/c on amiodarone but did not fill P:  Resume amiodarone 2/19   RENAL A:  AKI - Cr 1.5 on admission up from 1.25 at d/c last week. P:  Replace electrolytes as indicated   [facility] to assume care, can transfer to tele [facility] available as needed   [facility] V. MD [phone]  05/29/2015, 2:16 PM

## 2015-05-29 NOTE — Progress Notes (Addendum)
[place] TEAM 1 - Stepdown/ICU TEAM PROGRESS NOTE  Scott Shelton [id number] DOB: Feb 01, 1951 DOA: 05/27/2015 PCP: No primary care provider on file.  Admit HPI / Brief Narrative: 65 y/o man with Hx of admit for septic shock (1/31-2/14) in the setting of RLE cellulitis & syncope. Re-admitted 2/18 with submassive PE and syncopal event prior to presentation.   Significant Events: 2/18 Admit with syncope, hypoxia (new), found to have PE. Started on heparin gtt 2/20 TTE EF 55-60% - no WMA - mod pulm HTN   HPI/Subjective: The pt is resting comfortably in bed tolerating lunch w/o difficulty.  He denies cp, sob, n/v, or abdom pain.    Assessment/Plan:  Submassive PE Cont IV heparin - if decompensates will need EKOS v/s systemic lytic therapy - will need at least 6 months anticoagulation after discharge per PCCM, though given a-fib and obesity will consider lifelong tx - Case mgmt for medication assistance (does not have a car or reliable transportation) - of note pt had negative B LE venous duplex on 05/16/15 during prior hospital stay and was tx w/ Sub Q hep for DVT prophy during that admit   Acute Hypoxic Respiratory Failure in setting of PE  Cont to wean O2 support as able   4 mm RLL Pulmonary nodule  Will need outpt f/u in one year   Atrial Fibrillation Diagnosed during last admit - d/c on amiodarone 200mg  BID but did not fill - currently in NSR on oral amio - follow on tele   Mild hypokalemia  Replace and follow - check Mg in AM   AKI Cr 1.5 on admission, up from 1.25 at most recent prior d/c - improving - follow   GERD  Morbid Obesity - Body mass index is 45.14 kg/(m^2).  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 rate 18, height 6' (1.829 m), weight 151 kg (332 lb 14.3 oz), SpO2 93 %.  Intake/Output Summary (Last 24 hours) at 05/29/15 1335 Last data filed at 05/29/15 1300  Gross per 24 hour  Intake 781.31 ml  Output    495 ml  Net 286.31 ml   Exam: General: No acute respiratory distress Lungs: Clear to auscultation bilaterally without wheezes or crackles - very distant BS th/o  Cardiovascular: Regular rate and rhythm without murmur gallop or rub - distant heart sounds  Abdomen: Nontender, morbidly obese, soft, bowel sounds positive, no rebound, no ascites, no appreciable mass Extremities: severe 3++ B LE edema - dressings intact and dry presently   Data Reviewed: Basic Metabolic Panel:  Recent Labs Lab 05/27/15 1655 05/28/15 0450 05/29/15 0232  NA 141 141 140  K 4.0 3.5 3.4*  CL 107 110 108  CO2 23 25 24   GLUCOSE 103* 99 105*  BUN 15 14 13   CREATININE 1.48* 1.48* 1.35*  CALCIUM 9.7 9.0 9.4    CBC:  Recent Labs Lab 05/27/15 1655 05/28/15 0450 05/29/15 0232  WBC 5.6 5.3 6.1  NEUTROABS 3.5  --   --  HGB 8.9* 8.2* 8.1*  HCT 29.6* 27.3* 26.9*  MCV 100.3* 101.1* 101.5*  PLT 265 236 213    Liver Function Tests:  Recent Labs Lab 05/27/15 1655  AST 20  ALT 27  ALKPHOS 79  BILITOT 0.6  PROT 5.7*  ALBUMIN 3.1*    Recent Labs Lab 05/27/15 1655  AMMONIA 15   Coags:  Recent Labs Lab 05/27/15 1655  INR 1.31   CBG:  Recent Labs Lab 05/27/15 2157  GLUCAP 79    Recent Results (from the past 240 hour(s))  MRSA PCR Screening     Status: Abnormal   Collection Time: 05/27/15 10:11 PM  Result Value Ref Range Status   MRSA by PCR POSITIVE (A) NEGATIVE Final    Comment:        The GeneXpert MRSA Assay (FDA approved for NASAL specimens only),  is one component of a comprehensive MRSA colonization surveillance program. It is not intended to diagnose MRSA infection nor to guide or monitor treatment for MRSA infections. RESULT CALLED TO, READ BACK BY AND VERIFIED WITH: RN Joaquin Bend [id number] @[id number]  THANEY      Studies:   Recent x-ray studies have been reviewed in detail by the Attending Physician  Scheduled Meds:  Scheduled Meds: . amiodarone  200 mg Oral Daily  . chlorhexidine  15 mL Mouth Rinse BID  . Chlorhexidine Gluconate Cloth  6 each Topical Q0600  . hydrocerin   Topical Once per day on Mon Wed Fri  . mupirocin ointment  1 application Nasal BID  . traZODone  50 mg Oral QHS    Time spent on care of this patient: 35 mins   Deaire Mcwhirter T , MD   [facility] Office  [phone] Pager - Text Page per Shea Evans as per below:  On-Call/Text Page:      [email or web address]      password [email or web address]  If 7PM-7AM, please contact night-coverage [email or web address] Password [email or web address] 05/29/2015, 1:35 PM   LOS: 2 days

## 2015-05-29 NOTE — Consult Note (Addendum)
WOC wound consult note Reason for Consult: Consult requested for bilat legs.  Pt has chronic lymphademia with venous stasis skin changes to feet and legs.  No open wounds or drainage at this time. He states home health applies leg wraps twice a week with Eucerin and Ace wraps, and he has been using Xeroform to right foot wound to promote healing; current leg wraps have not been changed in a week. Wound bed: Generalized dry scaly skin which removes easily in patchy areas when rubbed.  Right foot with previous wound is now pink, dry, and healed. Dressing procedure/placement/frequency: Continue present plan of care with Eucerin to assist with removal of nonviable skin, and ace wrap for light compression three times a week.  Previous wound to right anterior foot is now pink dry scar tissue and no further topical treatment is indicated. Pt can resume follow-up with home health after discharge. Discussed plan of care with patient and he verbalized understanding. Please re-consult if further assistance is needed.  Thank-you,  Julien Girt MSN, [place], [place], [place], [place]

## 2015-05-29 NOTE — Care Management Note (Addendum)
Case Management Note  Patient Details  Name: Scott Shelton MRN: [id number] Date of Birth: 03-08-1951  Subjective/Objective:                  Action/Plan:  Pt recently discharged home with home health provided by [facility].  Pt already set up with initial appt at [facility] however CM will call and reschedule due to current hospitalization.  CM contacted [facility] to see if services can assist with transportation to PCP appts.  CM contacted [place] to inform of admit and will request resumption orders prior to discharge.  CM will continue to monitor.     Expected Discharge Date:                  Expected Discharge Plan:  [place]  In-House Referral:     Discharge planning Services  CM Consult  Post Acute Care Choice:  Resumption of Svcs/PTA Provider Choice offered to:  Patient  DME Arranged:    DME Agency:     HH Arranged:  RN, Social Work, PT [place] Agency:  [facility]  Status of Service:  In process, will continue to follow  Medicare Important Message Given:    Date Medicare IM Given:    Medicare IM give by:    Date Additional Medicare IM Given:    Additional Medicare Important Message give by:     If discussed at [place] of Stay Meetings, dates discussed:    Additional Comments: 05/29/2015 Appointment scheduled for 06/02/15 with [place].  CM spoke with [facility] liason and was informed that pt will be able to gain transportation assistance from clinic, pt should inform the clinic during the confirmation appt call that she needs transportaion.  CM will communicate this to pt, Maryclare Labrador, RN 05/29/2015, 8:34 AM

## 2015-05-30 ENCOUNTER — Inpatient Hospital Stay (HOSPITAL_COMMUNITY): Payer: MEDICAID

## 2015-05-30 DIAGNOSIS — R600 Localized edema: Secondary | ICD-10-CM

## 2015-05-30 LAB — HEPARIN LEVEL (UNFRACTIONATED)
HEPARIN UNFRACTIONATED: 0.29 [IU]/mL — AB (ref 0.30–0.70)
HEPARIN UNFRACTIONATED: 0.39 [IU]/mL (ref 0.30–0.70)
Heparin Unfractionated: 0.44 IU/mL (ref 0.30–0.70)

## 2015-05-30 LAB — BASIC METABOLIC PANEL
Anion gap: 8 (ref 5–15)
BUN: 13 mg/dL (ref 6–20)
CO2: 24 mmol/L (ref 22–32)
CREATININE: 1.4 mg/dL — AB (ref 0.61–1.24)
Calcium: 9.4 mg/dL (ref 8.9–10.3)
Chloride: 109 mmol/L (ref 101–111)
GFR, EST NON AFRICAN AMERICAN: 52 mL/min — AB (ref >60)
Glucose, Bld: 97 mg/dL (ref 65–99)
POTASSIUM: 3.2 mmol/L — AB (ref 3.5–5.1)
SODIUM: 141 mmol/L (ref 135–145)

## 2015-05-30 LAB — CBC
HCT: 26.8 % — ABNORMAL LOW (ref 39.0–52.0)
Hemoglobin: 8.1 g/dL — ABNORMAL LOW (ref 13.0–17.0)
MCH: 30.6 pg (ref 26.0–34.0)
MCHC: 30.2 g/dL (ref 30.0–36.0)
MCV: 101.1 fL — ABNORMAL HIGH (ref 78.0–100.0)
PLATELETS: 197 10*3/uL (ref 150–400)
RBC: 2.65 MIL/uL — AB (ref 4.22–5.81)
RDW: 15.7 % — ABNORMAL HIGH (ref 11.5–15.5)
WBC: 5.3 10*3/uL (ref 4.0–10.5)

## 2015-05-30 LAB — MAGNESIUM: MAGNESIUM: 2.2 mg/dL (ref 1.7–2.4)

## 2015-05-30 MED ORDER — POTASSIUM CHLORIDE CRYS ER 20 MEQ PO TBCR
20.0000 meq | EXTENDED_RELEASE_TABLET | Freq: Once | ORAL | Status: AC
  Administered 2015-05-30: 20 meq via ORAL
  Filled 2015-05-30: qty 1

## 2015-05-30 NOTE — Progress Notes (Signed)
05/30/15   Pharmacy- Heparin 2020   Heparin level 0.44  A/P:  65yo male on Heparin 2500 units/hr for bilateral PE.  Heparin level is within goal range of 0.3-0.7 on current rate of 2500 units/hr.  No bleeding noted.   -  Continue current rate -  Watch for s/s of bleeding -  F/U in AM   Gracy Bruins, PharmD [facility]

## 2015-05-30 NOTE — Progress Notes (Signed)
Pt refused to ambulate today, agreed to get up to recliner this afternoon.  Up with one assist.

## 2015-05-30 NOTE — Progress Notes (Signed)
ANTICOAGULATION CONSULT NOTE - Follow Up Consult  Pharmacy Consult for heparin Indication: bilateral PEs  No Known Allergies  Patient Measurements: Height: 6' (182.9 cm) Weight: (!) 332 lb 14.3 oz (151 kg) IBW/kg (Calculated) : 77.6 Heparin Dosing Weight: 113 kg  Vital Signs: Temp: 98.3 F (36.8 C) (02/20 2213) Temp Source: Oral (02/20 2213) BP: 94/50 mmHg (02/20 2213) Pulse Rate: 76 (02/20 2213)  Labs:  Recent Labs  05/27/15 1655 05/28/15 0450  05/28/15 2006 05/29/15 0232 05/30/15 0211  HGB 8.9* 8.2*  --   --  8.1* 8.1*  HCT 29.6* 27.3*  --   --  26.9* 26.8*  PLT 265 236  --   --  213 197  LABPROT 16.4*  --   --   --   --   --   INR 1.31  --   --   --   --   --   HEPARINUNFRC  --  0.40  < > 0.26* 0.62 0.29*  CREATININE 1.48* 1.48*  --   --  1.35*  --   < > = values in this interval not displayed.  Estimated Creatinine Clearance: 85.9 mL/min (by C-G formula based on Cr of 1.35).   Assessment: 65 yo m admitted with SOB and syncope, CTA showed bilateral submassive PE with R heart strain. Heparin level slightly subtherapeutic (0.29) on heparin at 2300 units/hr. Hgb low but stable. No issues with line or bleeding reported per RN.  Goal of Therapy:  Heparin level 0.3-0.7 units/ml Monitor platelets by anticoagulation protocol: Yes   Plan: -Increase heparin to 2500 units/hr -F/u 6 hour heparin level  Sherlon Handing, PharmD, BCPS Clinical pharmacist, pager [phone] 05/30/2015 4:33 AM

## 2015-05-30 NOTE — Progress Notes (Signed)
[facility] PROGRESS NOTE  Celester Scalisi [id number] DOB: Sep 21, 1950 DOA: 05/27/2015 PCP: No primary care provider on file.  Assessment/Plan: 65 y/o man with PMH of recent admit for septic shock (1/31-2/14) in the setting of RLE cellulitis & syncope. Re-admitted 2/18 with submassive PE and syncopal event prior to presentation. With increased LE edema over the last day and relative immobility high risk for DVT as well.   Significant Events: 2/18 Admit with syncope, hypoxia (new), found to have PE. 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 though given a-fib and obesity will consider lifelong tx  - of note pt had negative B LE venous duplex on 05/16/15 during prior hospital stay and was tx w/ Sub Q hep for DVT prophy during that admit  -ECHO; The right ventricular systolic pressure was increased consistent with moderate pulmonary hypertension. -doppler LE Pending  -Awaiting CM , insurance regarding cost of anticoagulation. Transportation might be problem for him for INR  Atrial Fibrillation - dx last admit, d/c on amiodarone but did not fill  AKI - Cr 1.5 on admission up from 1.25 at d/c last week Cr trending down.   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 He was put on oxygen overnight.   Objective: Filed Vitals:   05/29/15 2213 05/30/15 0448  BP: 94/50 99/57  Pulse: 76 89  Temp: 98.3 F (36.8 C) 98.4 F (36.9 C)  Resp: 18 16     Intake/Output Summary (Last 24 hours) at 05/30/15 1247 Last data filed at 05/30/15 0448  Gross per 24 hour  Intake     92 ml  Output    300 ml  Net   -208 ml   Filed Weights   05/28/15 0500 05/29/15 0500 05/30/15 0452  Weight: 151.3 kg (333 lb 8.9 oz) 151 kg (332 lb 14.3 oz) 152.046 kg (335 lb 3.2 oz)    Exam:   General:  NAD  Cardiovascular: S 1 , S 2 RRR  Respiratory: diminished , no wheezing  Abdomen: BS present, soft, nt  Musculoskeletal: Bilateral edema, with dressing   Data Reviewed: Basic Metabolic Panel:  Recent Labs Lab 05/27/15 1655 05/28/15 0450 05/29/15 0232 05/30/15 0211  NA 141 141 140 141  K 4.0 3.5 3.4* 3.2*  CL 107 110 108 109  CO2 23 25 24 24   GLUCOSE 103* 99 105* 97  BUN 15 14 13 13   CREATININE 1.48* 1.48* 1.35* 1.40*  CALCIUM 9.7 9.0 9.4 9.4  MG  --   --   --  2.2   Liver Function Tests:  Recent Labs Lab 05/27/15 1655  AST 20  ALT 27  ALKPHOS 79  BILITOT 0.6  PROT 5.7*  ALBUMIN 3.1*   No results for input(s): LIPASE, AMYLASE in the last 168 hours.  Recent Labs Lab 05/27/15 1655  AMMONIA 15   CBC:  Recent Labs Lab 05/27/15 1655 05/28/15 0450 05/29/15 0232 05/30/15 0211  WBC 5.6 5.3 6.1 5.3  NEUTROABS 3.5  --   --   --   HGB 8.9* 8.2* 8.1* 8.1*  HCT 29.6* 27.3* 26.9* 26.8*  MCV 100.3* 101.1* 101.5* 101.1*  PLT 265 236 213 197   Cardiac Enzymes: No results for input(s): CKTOTAL, CKMB, CKMBINDEX, TROPONINI in the last 168 hours. BNP (last 3 results)  Recent Labs  05/27/15 1655  BNP 140.4*    ProBNP (last 3 results) No results for input(s): PROBNP in the last 8760 hours.  CBG:  Recent Labs Lab 05/27/15 2157  GLUCAP 79    Recent Results (from the past 240 hour(s))  MRSA PCR Screening     Status: Abnormal   Collection Time: 05/27/15 10:11 PM  Result Value Ref Range Status   MRSA by PCR POSITIVE (A) NEGATIVE Final    Comment:        The GeneXpert MRSA Assay (FDA approved for NASAL  specimens only), is one component of a comprehensive MRSA colonization surveillance program. It is not intended to diagnose MRSA infection nor to guide or monitor treatment for MRSA infections. RESULT CALLED TO, READ BACK BY AND VERIFIED WITH: RN Joaquin Bend [id number] @0348  THANEY      Studies: No results found.  Scheduled Meds: . amiodarone  200 mg Oral Daily  . chlorhexidine  15 mL Mouth Rinse BID  . Chlorhexidine Gluconate Cloth  6 each Topical Q0600  . hydrocerin   Topical Once per day on Mon Wed Fri  . mupirocin ointment  1 application Nasal BID  . traZODone  50 mg Oral QHS   Continuous Infusions: . heparin 2,500 Units/hr (05/30/15 0448)    Active Problems:   PE (pulmonary embolism)    Time spent: 25 minutes.     Niel Hummer A  [facility] Pager [phone]. If 7PM-7AM, please contact night-coverage at [email or web address], password [facility] 05/30/2015, 12:47 PM  LOS: 3 days

## 2015-05-30 NOTE — Progress Notes (Signed)
Referral received for Please investigate copay / preauth status of the following medications to be used for tx of PE: Eliquis 10mg  po BID for 7 days then 5mg  po BID  Xarelto 15mg  po BID for 21 days then 20mg  po QD Pradaxa 150mg  po BID  Pt has NO insurance- he would pay out of pocket of any of these drugs- Coumadin would be cheapest option - but would need blood draws- if using any of the above- Xarelto would be best choice with once a day dosing- and could be provided 30 day free card - with pt assistance application.

## 2015-05-30 NOTE — Progress Notes (Signed)
[facility]  Patient Status: Active (receiving services up to time of hospitalization)  [facility] is providing the following services: RN and PT  & SW  If patient discharges after hours, please call [phone].   [name] 05/30/2015, 9:46 AM

## 2015-05-30 NOTE — Progress Notes (Signed)
PT Cancellation Note  Patient Details Name: Scott Shelton MRN: [id number] DOB: Jun 11, 1950   Cancelled Treatment:    Reason Eval/Treat Not Completed: Patient declined, no reason specified pt declined working with PT today due to "needing to take care of business." "I have been trying to get this done all day and i have not had the chance, it comes down to me not having running water when I leave here." Seems very upset with brother in room. Will follow up.   Marguarite Arbour A Ariadna Setter 05/30/2015, 4:06 PM Wray Kearns, [place], DPT [phone]

## 2015-05-30 NOTE — Progress Notes (Signed)
ANTICOAGULATION CONSULT NOTE - Follow Up Consult  Pharmacy Consult for heparin Indication: bilateral PEs  No Known Allergies  Patient Measurements: Height: 6' (182.9 cm) Weight: (!) 335 lb 3.2 oz (152.046 kg) IBW/kg (Calculated) : 77.6 Heparin Dosing Weight: 113 kg  Vital Signs: Temp: 98.4 F (36.9 C) (02/21 0448) Temp Source: Oral (02/21 0448) BP: 99/57 mmHg (02/21 0448) Pulse Rate: 89 (02/21 0448)  Labs:  Recent Labs  05/27/15 1655 05/28/15 0450  05/28/15 2006 05/29/15 0232 05/30/15 0211  HGB 8.9* 8.2*  --   --  8.1* 8.1*  HCT 29.6* 27.3*  --   --  26.9* 26.8*  PLT 265 236  --   --  213 197  LABPROT 16.4*  --   --   --   --   --   INR 1.31  --   --   --   --   --   HEPARINUNFRC  --  0.40  < > 0.26* 0.62 0.29*  CREATININE 1.48* 1.48*  --   --  1.35* 1.40*  < > = values in this interval not displayed.  Estimated Creatinine Clearance: 83.1 mL/min (by C-G formula based on Cr of 1.4).   Assessment: 65 yo m admitted with SOB and syncope, CTA showed bilateral submassive PE with R heart strain. Heparin level therapeutic x1 (0.39) on heparin at 2300 units/hr. Hgb low but stable 8.1, plt wnl. No issues with line or bleeding reported per RN.  Goal of Therapy:  Heparin level 0.3-0.7 units/ml Monitor platelets by anticoagulation protocol: Yes   Plan: -Heparin at 2500 units/hr -6h HL to confirm, Daily HL, CBC -F/u plans for long term anticoagulation  Elicia Lamp, PharmD, [facility] Clinical Pharmacist Pager [phone] 05/30/2015 11:06 AM

## 2015-05-31 ENCOUNTER — Telehealth: Payer: Self-pay

## 2015-05-31 DIAGNOSIS — R600 Localized edema: Secondary | ICD-10-CM

## 2015-05-31 DIAGNOSIS — J9601 Acute respiratory failure with hypoxia: Secondary | ICD-10-CM

## 2015-05-31 DIAGNOSIS — N183 Chronic kidney disease, stage 3 (moderate): Secondary | ICD-10-CM

## 2015-05-31 LAB — CBC
HCT: 27.3 % — ABNORMAL LOW (ref 39.0–52.0)
Hemoglobin: 8.4 g/dL — ABNORMAL LOW (ref 13.0–17.0)
MCH: 31.3 pg (ref 26.0–34.0)
MCHC: 30.8 g/dL (ref 30.0–36.0)
MCV: 101.9 fL — AB (ref 78.0–100.0)
PLATELETS: 178 10*3/uL (ref 150–400)
RBC: 2.68 MIL/uL — ABNORMAL LOW (ref 4.22–5.81)
RDW: 15.8 % — AB (ref 11.5–15.5)
WBC: 4.8 10*3/uL (ref 4.0–10.5)

## 2015-05-31 LAB — HEPARIN LEVEL (UNFRACTIONATED): Heparin Unfractionated: 0.55 IU/mL (ref 0.30–0.70)

## 2015-05-31 LAB — BASIC METABOLIC PANEL
ANION GAP: 6 (ref 5–15)
BUN: 9 mg/dL (ref 6–20)
CALCIUM: 9.5 mg/dL (ref 8.9–10.3)
CO2: 25 mmol/L (ref 22–32)
Chloride: 109 mmol/L (ref 101–111)
Creatinine, Ser: 1.4 mg/dL — ABNORMAL HIGH (ref 0.61–1.24)
GFR calc Af Amer: >60 mL/min (ref >60)
GFR, EST NON AFRICAN AMERICAN: 52 mL/min — AB (ref >60)
GLUCOSE: 99 mg/dL (ref 65–99)
Potassium: 3.3 mmol/L — ABNORMAL LOW (ref 3.5–5.1)
Sodium: 140 mmol/L (ref 135–145)

## 2015-05-31 LAB — PROTIME-INR
INR: 1.21 (ref 0.00–1.49)
PROTHROMBIN TIME: 15.4 s — AB (ref 11.6–15.2)

## 2015-05-31 MED ORDER — WARFARIN VIDEO
Freq: Once | Status: AC
  Administered 2015-05-31: 11:00:00

## 2015-05-31 MED ORDER — WARFARIN - PHARMACIST DOSING INPATIENT
Freq: Every day | Status: DC
  Administered 2015-05-31: 18:00:00

## 2015-05-31 MED ORDER — WARFARIN SODIUM 4 MG PO TABS
4.0000 mg | ORAL_TABLET | Freq: Once | ORAL | Status: AC
  Administered 2015-05-31: 4 mg via ORAL
  Filled 2015-05-31: qty 1

## 2015-05-31 MED ORDER — COUMADIN BOOK
Freq: Once | Status: AC
  Administered 2015-05-31: 11:00:00
  Filled 2015-05-31: qty 1

## 2015-05-31 MED ORDER — FUROSEMIDE 10 MG/ML IJ SOLN
60.0000 mg | Freq: Once | INTRAMUSCULAR | Status: AC
  Administered 2015-05-31: 60 mg via INTRAVENOUS
  Filled 2015-05-31: qty 6

## 2015-05-31 MED ORDER — POTASSIUM CHLORIDE CRYS ER 20 MEQ PO TBCR
40.0000 meq | EXTENDED_RELEASE_TABLET | Freq: Once | ORAL | Status: AC
  Administered 2015-05-31: 40 meq via ORAL
  Filled 2015-05-31: qty 2

## 2015-05-31 MED ORDER — ENOXAPARIN SODIUM 150 MG/ML ~~LOC~~ SOLN
150.0000 mg | Freq: Two times a day (BID) | SUBCUTANEOUS | Status: DC
  Administered 2015-05-31 – 2015-06-02 (×5): 150 mg via SUBCUTANEOUS
  Filled 2015-05-31 (×8): qty 1

## 2015-05-31 MED ORDER — ENOXAPARIN SODIUM 150 MG/ML ~~LOC~~ SOLN
220.0000 mg | SUBCUTANEOUS | Status: DC
  Filled 2015-05-31: qty 1.47

## 2015-05-31 NOTE — Evaluation (Signed)
Physical Therapy Evaluation Patient Details Name: Scott Shelton MRN: [id number] DOB: 1950/05/29 Today's Date: 05/31/2015   History of Present Illness  Pt admitted after syncopal event with bil PE, LE edema.  Pt hospitalized in January with septic shock, R LE cellulitis and syncope.  Clinical Impression  Patient presents with decreased strength, endurance, dyspnea on exertion, balance and overall mobility impacting safety. Mobility assessment limited today by self reported dizziness. Encouraged up in chair for all meals and walking to bathroom. Pt has no support at home and this is second admission in 1 month. Would benefit from ST SNF to maximize independence and mobility prior to return home. If pt refuses, recommend continued HHPT. Will follow acutely.     Follow Up Recommendations SNF    Equipment Recommendations  None recommended by PT    Recommendations for Other Services       Precautions / Restrictions Precautions Precautions: Fall Precaution Comments: watch sats, B LE wounds with wraps Restrictions Weight Bearing Restrictions: No      Mobility  Bed Mobility Overal bed mobility: Needs Assistance Bed Mobility: Supine to Sit     Supine to sit: Min guard;HOB elevated Sit to supine: Min assist   General bed mobility comments: reliant on bed rails. + dizziness.   Transfers Overall transfer level: Needs assistance Equipment used: Rolling walker (2 wheeled) Transfers: Sit to/from Stand Sit to Stand: Min guard Stand pivot transfers: Min guard       General transfer comment: cues for hand placement/technique. min guard for safety.   Ambulation/Gait Ambulation/Gait assistance: Min guard Ambulation Distance (Feet): 20 Feet Assistive device: Rolling walker (2 wheeled) Gait Pattern/deviations: Step-through pattern;Decreased stride length;Wide base of support Gait velocity: decreased Gait velocity interpretation: Below normal speed for age/gender General Gait Details:  Self limited ambulation due to self reports of dizziness. No visible signs. Mostly steady with use of RW. 2/4 DOE.  Stairs            Wheelchair Mobility    Modified Rankin (Stroke Patients Only)       Balance Overall balance assessment: Needs assistance Sitting-balance support: Feet supported;No upper extremity supported Sitting balance-Leahy Scale: Good Sitting balance - Comments: Sat EOB for ~10 mins due to reported dizziness. "it is not getting better" as pt talking about politics, no visible signs of dizziness noted.    Standing balance support: During functional activity Standing balance-Leahy Scale: Poor Standing balance comment: Reliant on BUEs for support on RW.                              Pertinent Vitals/Pain Pain Assessment: No/denies pain Faces Pain Scale: Hurts a little bit Pain Location: R heel Pain Descriptors / Indicators: Sore Pain Intervention(s): Repositioned (floated heel)    Home Living Family/patient expects to be discharged to:: Private residence Living Arrangements: Alone Available Help at Discharge: Other (Comment) (very little support) Type of Home: Apartment Home Access: Stairs to enter Entrance Stairs-Rails: Right;Left;Can reach both Entrance Stairs-Number of Steps: 2+2 Home Layout: One level Home Equipment: Walker - 2 wheels;Bedside commode      Prior Function Level of Independence: Independent with assistive device(s)         Comments: pt was able to sponge bathe, dress, toilet and prepare simple meals in the days since last hospitalization. No reported falls not including 2 syncopal episodes leading to hospital admissions.     Hand Dominance   Dominant Hand: Right  Extremity/Trunk Assessment   Upper Extremity Assessment: Defer to OT evaluation           Lower Extremity Assessment: Generalized weakness RLE Deficits / Details: AROM grossly WFL, strength 4/5, edema throughout with wraps on lower legs LLE  Deficits / Details: AROM grossly WFL, strength 4/5, edema throughout with wraps on lower legs  Cervical / Trunk Assessment: Normal  Communication   Communication: No difficulties  Cognition Arousal/Alertness: Awake/alert Behavior During Therapy: Flat affect Overall Cognitive Status: Within Functional Limits for tasks assessed                      General Comments      Exercises        Assessment/Plan    PT Assessment Patient needs continued PT services  PT Diagnosis Generalized weakness;Difficulty walking   PT Problem List Decreased strength;Cardiopulmonary status limiting activity;Decreased balance;Decreased mobility;Decreased activity tolerance  PT Treatment Interventions Balance training;Gait training;Functional mobility training;Therapeutic activities;Therapeutic exercise;Patient/family education;Stair training   PT Goals (Current goals can be found in the Care Plan section) Acute Rehab PT Goals Patient Stated Goal: to work out his finances PT Goal Formulation: With patient Time For Goal Achievement: 06/14/15 Potential to Achieve Goals: Good    Frequency Min 3X/week   Barriers to discharge Decreased caregiver support;Inaccessible home environment no support at home and steps to enter home    Co-evaluation               End of Session Equipment Utilized During Treatment: Oxygen Activity Tolerance: Patient limited by fatigue;Other (comment) (dizziness.) Patient left: in chair;with call bell/phone within reach Nurse Communication: Mobility status         Time: 1025-1055 PT Time Calculation (min) (ACUTE ONLY): 30 min   Charges:   PT Evaluation $PT Eval Moderate Complexity: 1 Procedure PT Treatments $Therapeutic Activity: 8-22 mins   PT G Codes:        [name] 05/31/2015, 11:34 AM  Wray Kearns, PT, DPT [phone]

## 2015-05-31 NOTE — Telephone Encounter (Signed)
Call received from Marvetta Gibbons, RN CM confirming the patient's appointment at the [facility] on 06/02/15 @ 1200.  She noted that he will need transportation to the appointment and he will need an INR check.  Informed her that the [facility] CM will call him tomorrow to confirm the appointment and to confirm transportation.  Also informed her that he can call the [facility] # [phone]  if he has any questions.

## 2015-05-31 NOTE — Discharge Instructions (Signed)

## 2015-05-31 NOTE — Progress Notes (Addendum)
ANTICOAGULATION CONSULT NOTE - Follow Up Consult  Pharmacy Consult for heparin >> lovenox/warfarin Indication: bilateral PEs  No Known Allergies  Patient Measurements: Height: 6' (182.9 cm) Weight: (!) 333 lb 1.6 oz (151.093 kg) IBW/kg (Calculated) : 77.6 Heparin Dosing Weight: 113 kg  Vital Signs: Temp: 98.5 F (36.9 C) (02/22 0558) Temp Source: Oral (02/22 0558) BP: 132/66 mmHg (02/22 0558) Pulse Rate: 72 (02/22 0558)  Labs:  Recent Labs  05/29/15 0232 05/30/15 0211 05/30/15 1139 05/30/15 1840 05/31/15 0432  HGB 8.1* 8.1*  --   --  8.4*  HCT 26.9* 26.8*  --   --  27.3*  PLT 213 197  --   --  178  HEPARINUNFRC 0.62 0.29* 0.39 0.44 0.55  CREATININE 1.35* 1.40*  --   --  1.40*    Estimated Creatinine Clearance: 82.8 mL/min (by C-G formula based on Cr of 1.4).   Assessment: 65 yo m admitted with SOB and syncope, CTA showed bilateral submassive PE with R heart strain. Heparin level remains therapeutic (0.58) on heparin at 2500 units/hr. Hgb low but stable 8.4, plt wnl. No issues with line or bleeding reported.  Goal of Therapy:  Heparin level 0.3-0.7 units/ml Monitor platelets by anticoagulation protocol: Yes   Plan: -Heparin at 2500 units/hr -Daily HL, CBC -F/u plans for long term anticoagulation  Elicia Lamp, PharmD, BCPS Clinical Pharmacist Pager [phone] 05/31/2015 9:27 AM   ADDENDUM:  Rx consulted to switch to lovenox+warfarin from heparin IV due to pt with no insurance coverage. Will d/c heparin drip and start lovenox 1 hour later - communicated with RN. INR 1.21. Pt is on amiodarone, which may affect the INR. Hg low stable 8.4, plt wnl. No bleed issues per RN. Will need at least 5 days of overlap - Day #1. Wt~151kg, CrCl~82.  Goal of therapy: INR 2-3  Plan: -D/c heparin drip >> start lovenox 1 hour after dc -Lovenox 150mg  [place] q12h - D#1/5 of overlap -Warfarin 4mg  x1 dose tonight -Daily INR, CBC at least q72h -Mon s/sx bleeding  Elicia Lamp,  PharmD, BCPS Clinical Pharmacist Pager [phone] 05/31/2015 10:52 AM

## 2015-05-31 NOTE — Progress Notes (Signed)
   05/31/15 1600  Clinical Encounter Type  Visited With Patient  Visit Type Spiritual support  Referral From Patient  Spiritual Encounters  Spiritual Needs Prayer;Emotional  Stress Factors  Patient Stress Factors Family relationships;Financial concerns;Health changes;Lack of caregivers  Chaplain continued previous visit with patient, who is very anxious about what will happen when he goes home tomorrow. He has serious financial problems, gets little and sporadic support from family, and anticipates that he will end up back in the hospital because he won't be able to get the care he needs once he is home. Prayed with patient. Samika Vetsch, Chaplain

## 2015-05-31 NOTE — Care Management Note (Signed)
Case Management Note Previous CM note initiated by Elenor Quinones RN, CM  Patient Details  Name: Moriah Huffines MRN: [id number] Date of Birth: Jun 17, 1950  Subjective/Objective:                  Action/Plan:  Pt recently discharged home with home health provided by [facility].  Pt already set up with initial appt at [facility] however CM will call and reschedule due to current hospitalization.  CM contacted [facility] to see if services can assist with transportation to PCP appts.  CM contacted [facility] to inform of admit and will request resumption orders prior to discharge.  CM will continue to monitor.     Expected Discharge Date:                  Expected Discharge Plan:  [place]  In-House Referral:     Discharge planning Services  CM Consult  Post Acute Care Choice:  Resumption of Svcs/PTA Provider Choice offered to:  Patient  DME Arranged:    DME Agency:     HH Arranged:  RN, Social Work, PT [place] Agency:  [place]  Status of Service:  In process, will continue to follow  Medicare Important Message Given:    Date Medicare IM Given:    Medicare IM give by:    Date Additional Medicare IM Given:    Additional Medicare Important Message give by:     If discussed at [place] of Stay Meetings, dates discussed:    Additional Comments:   05/31/15- Marvetta Gibbons RN, BSN  Appointment scheduled for 06/02/15 with [facility].  CM spoke with [facility] liason and was informed that pt will be able to gain transportation assistance from clinic, pt should inform the clinic during the confirmation appt call that he needs transportaion.  CM will communicate this to pt, in conversation with pt at bedside- pt states that he usually is transported home vis EMS- CSW can assist with this if brother can not transport pt home- discussed transportation to clinic- pt voices understanding that clinic can assist with this- will make sure clinic has pt's correct phone #- cell [phone]. Also discussed  medication needs- pt states that he does not have the income right now to get medications still has his scripts with him from his last discharge from hospital- pt is eligible for [facility] assistance which has $3 copay per script- pt states that he would not even be able to afford that- will see if pt can be approved for override of copay cost. Pt is to see if someone can go to pharmacy for him to pick up scripts- otherwise may need to use [facility] and pick up scripts for pt prior to pt leaving via EMS. Pt was active with [facility] and agreeable to resume [facility] services- MD aware to resume orders for [facility].  Also provided pt a Medicaid application- per CSW - FC have looked into this for pt on last hospital stay and do not feel pt will qualify- however- explained to pt that he can go to DSS and speak with someone regarding applying.   Dawayne Patricia, RN 05/31/2015, 2:19 PM

## 2015-05-31 NOTE — Evaluation (Signed)
Occupational Therapy Evaluation Patient Details Name: Scott Shelton MRN: [id number] DOB: 1950-05-24 Today's Date: 05/31/2015    History of Present Illness Pt admitted after syncopal event with PE, LE edema.  Pt hospitalized in January with septic shock, R LE cellulitis and syncope.   Clinical Impression   Pt was able to sponge bathe, perform UB dressing, toilet and prepare a simple meal prior to this admission.  He was ambulating with a RW and participating in [place].  Pt presents with decreased activity tolerance, impaired standing balance and obesity limiting ability to perform LB ADL. Pt educated in importance of ambulation and OOB activity. Recommended short bursts of activity with intermittent rest breaks. Educated in pursed lip breathing.  Pt has very poor support at home. Will follow acutely.    Follow Up Recommendations  Supervision/Assistance - 24 hour;SNF (HHOT if pt goes home)    Equipment Recommendations  Other (comment) (AE--pt has no insurance)    Recommendations for Other Services       Precautions / Restrictions Precautions Precautions: Fall Precaution Comments: watch sats, B LE wounds with wraps Restrictions Weight Bearing Restrictions: No      Mobility Bed Mobility Overal bed mobility: Needs Assistance Bed Mobility: Supine to Sit;Sit to Supine     Supine to sit: Min guard Sit to supine: Min assist   General bed mobility comments: reliant on bed rails, assisted LEs back into bed  Transfers Overall transfer level: Needs assistance   Transfers: Sit to/from Stand;Stand Pivot Transfers Sit to Stand: Min guard Stand pivot transfers: Min guard            Balance     Sitting balance-Leahy Scale: Good       Standing balance-Leahy Scale: Poor                              ADL Overall ADL's : Needs assistance/impaired Eating/Feeding: Independent;Sitting   Grooming: Wash/dry hands;Wash/dry face;Sitting;Set up   Upper Body Bathing: Set  up;Sitting   Lower Body Bathing: Sit to/from stand;Min guard   Upper Body Dressing : Set up;Sitting   Lower Body Dressing: Maximal assistance;Sit to/from stand Lower Body Dressing Details (indicate cue type and reason): donning/doffing socks Toilet Transfer: Min guard;Stand-pivot;BSC   Toileting- Clothing Manipulation and Hygiene: Minimal assistance;Sit to/from stand         General ADL Comments: reinforced energy conservation education from previous admission and breathing techniques.     Vision     Perception     Praxis      Pertinent Vitals/Pain Pain Assessment: Faces Faces Pain Scale: Hurts a little bit Pain Location: R heel Pain Descriptors / Indicators: Sore Pain Intervention(s): Repositioned (floated heel)     Hand Dominance Right   Extremity/Trunk Assessment Upper Extremity Assessment Upper Extremity Assessment: Overall WFL for tasks assessed   Lower Extremity Assessment Lower Extremity Assessment: Defer to PT evaluation   Cervical / Trunk Assessment Cervical / Trunk Assessment: Normal   Communication Communication Communication: No difficulties   Cognition Arousal/Alertness: Awake/alert Behavior During Therapy: Flat affect Overall Cognitive Status: Within Functional Limits for tasks assessed                     General Comments       Exercises       Shoulder Instructions      Home Living Family/patient expects to be discharged to:: Private residence Living Arrangements: Alone Available Help at Discharge: Other (Comment) (  very little support) Type of Home: Apartment Home Access: Stairs to enter Entrance Stairs-Number of Steps: 4 Entrance Stairs-Rails: Right;Left;Can reach both Home Layout: One level     Bathroom Shower/Tub: Teacher, early years/pre: Standard     Home Equipment: Environmental consultant - 2 wheels;Bedside commode          Prior Functioning/Environment Level of Independence: Independent with assistive device(s)         Comments: pt was able to sponge bathe, dress, toilet and prepare simple meals in the days since last hospitalization    OT Diagnosis: Generalized weakness   OT Problem List: Decreased strength;Decreased activity tolerance;Impaired balance (sitting and/or standing);Decreased knowledge of use of DME or AE;Cardiopulmonary status limiting activity;Obesity;Pain   OT Treatment/Interventions: Self-care/ADL training;Patient/family education;DME and/or AE instruction;Energy conservation;Therapeutic exercise    OT Goals(Current goals can be found in the care plan section) Acute Rehab OT Goals Patient Stated Goal: to work out his finances OT Goal Formulation: With patient Time For Goal Achievement: 06/14/15 Potential to Achieve Goals: Good ADL Goals Pt Will Perform Grooming: with modified independence;standing (2 activities) Pt Will Perform Lower Body Bathing: with modified independence;with adaptive equipment;sit to/from stand Pt Will Perform Lower Body Dressing: with modified independence;with adaptive equipment;sit to/from stand Pt Will Transfer to Toilet: with modified independence;ambulating Pt Will Perform Toileting - Clothing Manipulation and hygiene: with modified independence;sit to/from stand Pt/caregiver will Perform Home Exercise Program: Increased strength;Both right and left upper extremity;With theraband;Independently (level 2) Additional ADL Goal #1: Pt will generalize energy conservation strategies and breathing techniques in ADL independently.  OT Frequency: Min 2X/week   Barriers to D/C:            Co-evaluation              End of Session Equipment Utilized During Treatment: Oxygen  Activity Tolerance: Patient limited by fatigue Patient left: in bed;with call bell/phone within reach   Time: 0851-0919 OT Time Calculation (min): 28 min Charges:  OT General Charges $OT Visit: 1 Procedure OT Evaluation $OT Eval Moderate Complexity: 1 Procedure G-Codes:     Malka So 05/31/2015, 9:31 AM  [phone]

## 2015-05-31 NOTE — Progress Notes (Signed)
[facility] PROGRESS NOTE  [name] DOB: 01-Oct-1950 DOA: 05/27/2015 PCP: No primary care provider on file.  Assessment/Plan: 65 y/o man with PMH of recent admit for septic shock (1/31-2/14) in the setting of RLE cellulitis & syncope. Re-admitted 2/18 with submassive PE and syncopal event prior to presentation. With increased LE edema over the last day and relative immobility high risk for DVT as well.   Significant Events: 2/18 Admit with syncope, hypoxia (new), found to have PE. 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 though given a-fib and obesity will consider lifelong tx  - of note pt had negative B LE venous duplex on 05/16/15 during prior hospital stay and was tx w/ Sub Q hep for DVT prophy during that admit  -ECHO; The right ventricular systolic pressure was increased consistent with moderate pulmonary hypertension. -doppler LE Pending. -wean oxygen, ambulate, trial of lasix -patient does not have insurance, no transportation, with poor social support, need extensive discharge planning. -i have discussed with patient, he agreed to transition to coumadin, pharmacy to dose coumadin with lovenox bridge, d/c heparin.  Atrial Fibrillation - dx last admit, d/c on amiodarone but did not fill  AKI - Cr 1.5 on admission up from 1.25 at d/c last week Cr trending down.   4 mm RLL Pulmonary nodule  Will need outpt f/u and repeat ct chest in 26months  H/O R LE cellulitis w/ sepsis - severe chronic B LE edema w/ venous stasis dermatitis  Cont wound care per [facility]  Trial of lasix on 2/22   Deconditioning, recent multiple hospitalization and baseline obesity, will need PT.  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  Code Status: Full code Family Communication: care discussed with patient Disposition Plan: need to wean oxygen and get  PT,  might benefit from rehab SNF but patient does not have insurance   Consultants:  CCM admitted patient , transferred to hospitalist service on 2/21  Procedures:  ECHO;   Doppler pending  Antibiotics: none  HPI/Subjective: Still on oxygen, denies sob or chest pain at rest  Objective: Filed Vitals:   05/30/15 2122 05/31/15 0558  BP: 114/57 132/66  Pulse: 66 72  Temp: 98.2 F (36.8 C) 98.5 F (36.9 C)  Resp: 20     Intake/Output Summary (Last 24 hours) at 05/31/15 1032 Last data filed at 05/31/15 0008  Gross per 24 hour  Intake      0 ml  Output    350 ml  Net   -350 ml   Filed Weights   05/29/15 0500 05/30/15 0452 05/31/15 0558  Weight: 151 kg (332 lb 14.3 oz) 152.046 kg (335 lb 3.2 oz) 151.093 kg (333 lb 1.6 oz)    Exam:   General:  NAD  Cardiovascular: S 1 , S 2 RRR  Respiratory: diminished , no wheezing  Abdomen: BS present, soft, nt  Musculoskeletal: Bilateral edema, with dressing   Data Reviewed: Basic Metabolic Panel:  Recent Labs Lab 05/27/15 1655 05/28/15 0450 05/29/15 0232 05/30/15 0211 05/31/15 0432  NA 141 141 140 141 140  K 4.0 3.5 3.4* 3.2* 3.3*  CL 107 110 108 109 109  CO2 23 25 24 24 25   GLUCOSE 103* 99 105* 97 99  BUN 15 14 13 13 9   CREATININE 1.48* 1.48* 1.35* 1.40* 1.40*  CALCIUM 9.7 9.0 9.4 9.4 9.5  MG  --   --   --  2.2  --    Liver Function Tests:  Recent Labs Lab 05/27/15 1655  AST 20  ALT 27  ALKPHOS 79  BILITOT 0.6  PROT 5.7*  ALBUMIN 3.1*   No results for input(s): LIPASE, AMYLASE in the last 168 hours.  Recent Labs Lab 05/27/15 1655  AMMONIA 15   CBC:  Recent Labs Lab 05/27/15 1655 05/28/15 0450 05/29/15 0232 05/30/15 0211 05/31/15 0432  WBC 5.6 5.3 6.1 5.3 4.8  NEUTROABS 3.5  --   --   --   --   HGB 8.9* 8.2* 8.1* 8.1* 8.4*  HCT 29.6* 27.3* 26.9* 26.8* 27.3*  MCV 100.3* 101.1* 101.5* 101.1* 101.9*  PLT 265 236 213 197 178   Cardiac Enzymes: No results for input(s): CKTOTAL, CKMB,  CKMBINDEX, TROPONINI in the last 168 hours. BNP (last 3 results)  Recent Labs  05/27/15 1655  BNP 140.4*    ProBNP (last 3 results) No results for input(s): PROBNP in the last 8760 hours.  CBG:  Recent Labs Lab 05/27/15 2157  GLUCAP 79    Recent Results (from the past 240 hour(s))  MRSA PCR Screening     Status: Abnormal   Collection Time: 05/27/15 10:11 PM  Result Value Ref Range Status   MRSA by PCR POSITIVE (A) NEGATIVE Final    Comment:        The GeneXpert MRSA Assay (FDA approved for NASAL specimens only), is one component of a comprehensive MRSA colonization surveillance program. It is not intended to diagnose MRSA infection nor to guide or monitor treatment for MRSA infections. RESULT CALLED TO, READ BACK BY AND VERIFIED WITH: RN Joaquin Bend [id number] @0348  THANEY      Studies: No results found.  Scheduled Meds: . amiodarone  200 mg Oral Daily  . chlorhexidine  15 mL Mouth Rinse BID  . Chlorhexidine Gluconate Cloth  6 each Topical Q0600  . hydrocerin   Topical Once per day on Mon Wed Fri  . mupirocin ointment  1 application Nasal BID  . traZODone  50 mg Oral QHS   Continuous Infusions: . heparin 2,500 Units/hr (05/31/15 0831)    Active Problems:   PE (pulmonary embolism)    Time spent: 35 minutes.     Halei Hanover MD PhD  [facility] Pager [phone]. If 7PM-7AM, please contact night-coverage at [email or web address], password [facility] 05/31/2015, 10:32 AM  LOS: 4 days

## 2015-05-31 NOTE — Progress Notes (Signed)
   05/31/15 1037  Clinical Encounter Type  Visited With Patient  Visit Type Spiritual support  Referral From Patient  Spiritual Encounters  Spiritual Needs Emotional  Stress Factors  Patient Stress Factors Family relationships;Financial concerns;Health changes  Patient requested chaplain visit to discuss issues with family that are weighing on his mind, primarily their lack of support to him. Chaplain and patient talked for about a half hour before physician came in to discuss his being sent home and the medical care he would need as follow-up. Chaplain stayed for a little while until it became clear that patient would be tied up for some time with these issues. Offered to come back later. Dillan Lunden, Chaplain

## 2015-06-01 DIAGNOSIS — R627 Adult failure to thrive: Secondary | ICD-10-CM

## 2015-06-01 DIAGNOSIS — I272 Other secondary pulmonary hypertension: Secondary | ICD-10-CM

## 2015-06-01 DIAGNOSIS — A047 Enterocolitis due to Clostridium difficile: Secondary | ICD-10-CM

## 2015-06-01 LAB — PROTIME-INR
INR: 1.34 (ref 0.00–1.49)
Prothrombin Time: 16.7 seconds — ABNORMAL HIGH (ref 11.6–15.2)

## 2015-06-01 LAB — BASIC METABOLIC PANEL
Anion gap: 8 (ref 5–15)
BUN: 8 mg/dL (ref 6–20)
CALCIUM: 9.6 mg/dL (ref 8.9–10.3)
CHLORIDE: 108 mmol/L (ref 101–111)
CO2: 26 mmol/L (ref 22–32)
CREATININE: 1.45 mg/dL — AB (ref 0.61–1.24)
GFR calc Af Amer: 58 mL/min — ABNORMAL LOW (ref >60)
GFR calc non Af Amer: 50 mL/min — ABNORMAL LOW (ref >60)
GLUCOSE: 100 mg/dL — AB (ref 65–99)
Potassium: 3.7 mmol/L (ref 3.5–5.1)
Sodium: 142 mmol/L (ref 135–145)

## 2015-06-01 LAB — CBC
HEMATOCRIT: 29.7 % — AB (ref 39.0–52.0)
HEMOGLOBIN: 8.9 g/dL — AB (ref 13.0–17.0)
MCH: 30.1 pg (ref 26.0–34.0)
MCHC: 30 g/dL (ref 30.0–36.0)
MCV: 100.3 fL — ABNORMAL HIGH (ref 78.0–100.0)
Platelets: 193 10*3/uL (ref 150–400)
RBC: 2.96 MIL/uL — AB (ref 4.22–5.81)
RDW: 15.7 % — ABNORMAL HIGH (ref 11.5–15.5)
WBC: 4.8 10*3/uL (ref 4.0–10.5)

## 2015-06-01 LAB — C DIFFICILE QUICK SCREEN W PCR REFLEX
C DIFFICILE (CDIFF) TOXIN: POSITIVE — AB
C Diff antigen: POSITIVE — AB
C Diff interpretation: POSITIVE

## 2015-06-01 LAB — VITAMIN B12: Vitamin B-12: 337 pg/mL (ref 180–914)

## 2015-06-01 MED ORDER — ENOXAPARIN (LOVENOX) PATIENT EDUCATION KIT
PACK | Freq: Once | Status: AC
  Administered 2015-06-01: 08:00:00
  Filled 2015-06-01: qty 1

## 2015-06-01 MED ORDER — VANCOMYCIN 50 MG/ML ORAL SOLUTION
250.0000 mg | Freq: Four times a day (QID) | ORAL | Status: DC
  Administered 2015-06-01 – 2015-06-02 (×4): 250 mg via ORAL
  Filled 2015-06-01 (×8): qty 5

## 2015-06-01 MED ORDER — ENOXAPARIN SODIUM 150 MG/ML ~~LOC~~ SOLN: 150.0000 mg | Freq: Two times a day (BID) | SUBCUTANEOUS | Status: DC

## 2015-06-01 MED ORDER — VANCOMYCIN HCL 250 MG PO CAPS: 250.0000 mg | ORAL_CAPSULE | Freq: Four times a day (QID) | ORAL | Status: DC

## 2015-06-01 MED ORDER — WARFARIN SODIUM 5 MG PO TABS
5.0000 mg | ORAL_TABLET | Freq: Once | ORAL | Status: AC
  Administered 2015-06-01: 5 mg via ORAL
  Filled 2015-06-01: qty 1

## 2015-06-01 MED ORDER — FUROSEMIDE 10 MG/ML IJ SOLN
60.0000 mg | Freq: Once | INTRAMUSCULAR | Status: AC
  Administered 2015-06-01: 60 mg via INTRAVENOUS
  Filled 2015-06-01: qty 6

## 2015-06-01 MED ORDER — FUROSEMIDE 10 MG/ML IJ SOLN
60.0000 mg | Freq: Every day | INTRAMUSCULAR | Status: DC
  Administered 2015-06-02: 60 mg via INTRAVENOUS
  Filled 2015-06-01: qty 6

## 2015-06-01 NOTE — Progress Notes (Signed)
[facility] PROGRESS NOTE  Scott Shelton [id number] DOB: September 29, 1950 DOA: 05/27/2015 PCP: No primary care provider on file.  Assessment/Plan: 65 y/o man with PMH of recent admit for septic shock (1/31-2/14) in the setting of RLE cellulitis & syncope. Re-admitted 2/18 with submassive PE and syncopal event prior to presentation. With increased LE edema over the last day and relative immobility high risk for DVT as well.   Significant Events: 2/18 Admit with syncope, hypoxia (new), found to have PE. 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 though given a-fib and obesity will consider lifelong tx  - of note pt had negative B LE venous duplex on 05/16/15 during prior hospital stay and was tx w/ Sub Q hep for DVT prophy during that admit  -ECHO; The right ventricular systolic pressure was increased consistent with moderate pulmonary hypertension. -doppler LE no DVt but with superficial vein thrombosis involving  the right greater saphenous vein. -wean oxygen, ambulate, trial of lasix -patient does not have insurance, no transportation, with poor social support, need extensive discharge planning. -i have discussed with patient, he agreed to transition to coumadin, pharmacy to dose coumadin with lovenox bridge, d/c heparin. -d/c to SNF with coumadin and lovenox bridging. SNF to close monitor INR (goal 2-3), plan discharge on 2/23  Paroxysmal Atrial Fibrillation - he was discharged on amiodarone from prior hospitalization, will continue, remain mostly in sinus rhythm this admission, low CHADSVASc score, he is on anticoagulation due to PE, outpatient cardiology follow up.  AKI - Cr 1.5 on admission up from 1.25 at d/c last week Cr trending down.   4 mm RLL Pulmonary nodule  Will need outpt f/u and repeat ct chest in 6months  H/O R LE cellulitis w/ sepsis - severe chronic B LE edema w/ venous  stasis dermatitis  Cont wound care per [facility]  Trial of lasix on 2/22 Responded well to lasix, will continue lasix  Cdiff diarrhea: started oral vancocin for total of 14days treatment.  Macrocytic Anemia: mcv 100, no overt sign of bleed, b12 wnl , folate pending. Monitor cbc, Consider outpatient colonoscopy.  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  Code Status: Full code Family Communication: care discussed with patient Disposition Plan: SNF    Consultants:  admitted to critical care service , transferred to hospitalist service on 2/21  Procedures:  ECHO;   Doppler   CTA  Antibiotics: none  HPI/Subjective: Developed diarrhea overnight, c diff positive off oxygen, reported lasix helped  Objective: Filed Vitals:   05/31/15 2154 06/01/15 0329  BP: 111/55 126/62  Pulse: 76 77  Temp: 98 F (36.7 C) 98.2 F (36.8 C)  Resp: 20 20    Intake/Output Summary (Last 24 hours) at 06/01/15 1733 Last data filed at 06/01/15 1336  Gross per 24 hour  Intake    240 ml  Output   2575 ml  Net  -2335 ml   Filed Weights   05/29/15 0500 05/30/15 0452 05/31/15 0558  Weight: 151 kg (332 lb 14.3 oz) 152.046 kg (335 lb 3.2 oz) 151.093 kg (333 lb 1.6 oz)    Exam:   General:  NAD, obese  Cardiovascular: S 1 , S 2 RRR  Respiratory: diminished , no wheezing  Abdomen: BS present, soft, nt  Musculoskeletal: Bilateral edema, with dressing   Data Reviewed:  Basic Metabolic Panel:  Recent Labs Lab 05/28/15 0450 05/29/15 0232 05/30/15 0211 05/31/15 0432 06/01/15 0510  NA 141 140 141 140 142  K 3.5 3.4* 3.2* 3.3* 3.7  CL 110 108 109 109 108  CO2 25 24 24 25 26   GLUCOSE 99 105* 97 99 100*  BUN 14 13 13 9 8   CREATININE 1.48* 1.35* 1.40* 1.40* 1.45*  CALCIUM 9.0 9.4 9.4 9.5 9.6  MG  --   --  2.2  --   --    Liver Function Tests:  Recent Labs Lab  05/27/15 1655  AST 20  ALT 27  ALKPHOS 79  BILITOT 0.6  PROT 5.7*  ALBUMIN 3.1*   No results for input(s): LIPASE, AMYLASE in the last 168 hours.  Recent Labs Lab 05/27/15 1655  AMMONIA 15   CBC:  Recent Labs Lab 05/27/15 1655 05/28/15 0450 05/29/15 0232 05/30/15 0211 05/31/15 0432 06/01/15 0510  WBC 5.6 5.3 6.1 5.3 4.8 4.8  NEUTROABS 3.5  --   --   --   --   --   HGB 8.9* 8.2* 8.1* 8.1* 8.4* 8.9*  HCT 29.6* 27.3* 26.9* 26.8* 27.3* 29.7*  MCV 100.3* 101.1* 101.5* 101.1* 101.9* 100.3*  PLT 265 236 213 197 178 193   Cardiac Enzymes: No results for input(s): CKTOTAL, CKMB, CKMBINDEX, TROPONINI in the last 168 hours. BNP (last 3 results)  Recent Labs  05/27/15 1655  BNP 140.4*    ProBNP (last 3 results) No results for input(s): PROBNP in the last 8760 hours.  CBG:  Recent Labs Lab 05/27/15 2157  GLUCAP 79    Recent Results (from the past 240 hour(s))  MRSA PCR Screening     Status: Abnormal   Collection Time: 05/27/15 10:11 PM  Result Value Ref Range Status   MRSA by PCR POSITIVE (A) NEGATIVE Final    Comment:        The GeneXpert MRSA Assay (FDA approved for NASAL specimens only), is one component of a comprehensive MRSA colonization surveillance program. It is not intended to diagnose MRSA infection nor to guide or monitor treatment for MRSA infections. RESULT CALLED TO, READ BACK BY AND VERIFIED WITH: RN Joaquin Bend [id number] @0348  THANEY   C difficile quick scan w PCR reflex     Status: Abnormal   Collection Time: 06/01/15  7:29 AM  Result Value Ref Range Status   C Diff antigen POSITIVE (A) NEGATIVE Final   C Diff toxin POSITIVE (A) NEGATIVE Final   C Diff interpretation Positive for toxigenic C. difficile  Final    Comment: CRITICAL RESULT CALLED TO, READ BACK BY AND VERIFIED WITH: A. MOFFITT RN 12:10 06/01/15 ([email or web address])      Studies: No results found.  Scheduled Meds: . amiodarone  200 mg Oral Daily  . chlorhexidine  15 mL Mouth  Rinse BID  . enoxaparin (LOVENOX) injection  150 mg Subcutaneous Q12H  . [START ON 06/02/2015] furosemide  60 mg Intravenous Daily  . hydrocerin   Topical Once per day on Mon Wed Fri  . mupirocin ointment  1 application Nasal BID  . traZODone  50 mg Oral QHS  . vancomycin  250 mg Oral 4 times per day  . warfarin  5 mg Oral ONCE-1800  . Warfarin - Pharmacist Dosing Inpatient   Does not apply q1800   Continuous Infusions:    Active Problems:   PE (pulmonary embolism)    Time spent: 35 minutes.     Khyler Urda MD PhD  [facility] Pager [phone]. If 7PM-7AM, please contact night-coverage at [email or web address], password [facility] 06/01/2015, 5:33 PM  LOS: 5 days

## 2015-06-01 NOTE — Progress Notes (Signed)
Utilization review completed.  

## 2015-06-01 NOTE — Discharge Summary (Addendum)
Discharge Summary  Scott Shelton [id number] DOB: 01-05-1951  PCP: No primary care provider on file.  Admit date: 05/27/2015 Discharge date: 06/02/2015  Time spent: >4mins  Recommendations for Outpatient Follow-up:  1. F/u with PMD at SNF, pmd to repeat INR daily till inr therapeutic, patient is discharged on comadin with lovenox bridging. Repeat cbc/bmp in one week to monitor hgb and cr level. Lasix dose need to be adjusted according to cr level. 2. F/u with cardiology for afib 3. F/u with wound care center   Discharge Diagnoses:  Active Hospital Problems   Diagnosis Date Noted  . Leg edema   . Enteritis due to Clostridium difficile   . Morbid obesity ([place])   . Pulmonary hypertension ([place])   . CKD (chronic kidney disease), stage III   . Acute respiratory failure with hypoxia ([place])   . PE (pulmonary embolism) 05/27/2015    Resolved Hospital Problems   Diagnosis Date Noted Date Resolved  No resolved problems to display.    Discharge Condition: stable  Diet recommendation: heart healthy  Filed Weights   05/30/15 0452 05/31/15 0558 06/02/15 0711  Weight: 152.046 kg (335 lb 3.2 oz) 151.093 kg (333 lb 1.6 oz) 148.6 kg (327 lb 9.7 oz)    History of present illness:  Scott Shelton is a 65 y/o man with recent hospitalization for septic shock likely secondary to LE cellulitis. He was discharged on 05/23/15. He had been doing well since his discharge until the afternoon of admission when he noted that he was acutely short of breath after walking from his chair in the living room to the kitchen. He became acutely short of breath again after answering the door (a neighbor who was bringing him a plate of food) and had a syncopal episode. The neighbor then called EMS. He was hypoxic when EMS arrive (83% on RA per nursing note). He was placed on nasal canula and given some IV fluids. In the ED a CTA was done which showed bilateral PEs in the segmental arteries. He has not had any  further syncope. He is now satting well on 2-3L of O2 by nasal canula. He is not hypertensive or tachycardic. He does have a history of a-fib and is not currently on any medications although he was on amiodarone while hospitalized. He also has a history of significant depression and was started on SSRI by psychiatry during his previous admission.   Of note he does report that he noticed his legs starting to swell more the day prior to admission although they are swollen at baseline. He has not had any increased leg pain and denies any chest pain.   Hospital Course:  Active Problems:   PE (pulmonary embolism)   Leg edema   Enteritis due to Clostridium difficile   Morbid obesity ([place])   Pulmonary hypertension (H[facility])   CKD (chronic kidney disease), stage III   Acute respiratory failure with hypoxia (H[facility])  Submassive PE with hypoxic respiratory failure -will need at least 6 months anticoagulation. though given a-fib and obesity will consider lifelong tx  - of note pt had negative B LE venous duplex on 05/16/15 during prior hospital stay and was tx w/ Sub Q hep for DVT prophy during that admit  -ECHO; The right ventricular systolic pressure was increased consistent with moderate pulmonary hypertension. -doppler LE no DVt but with superficial vein thrombosis involving the right greater saphenous vein. -weaned off oxygen, ambulate, trial of lasix -patient does not have insurance, no transportation, with poor social  support, need extensive discharge planning. -i have discussed with patient, he agreed to transition to coumadin, pharmacy to dose coumadin with lovenox bridge, d/c heparin. -d/c to SNF with coumadin and lovenox bridging. SNF to close monitor INR (goal 2-3). Of note, patient is also on amiodarone, please close monitor INR. Due to interaction between amiodarone and coumadin.  Paroxysmal Atrial Fibrillation - he was discharged on amiodarone from prior hospitalization, will continue,  remain mostly in sinus rhythm this admission, low CHADSVASc score, he is on anticoagulation due to PE, outpatient cardiology follow up.  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 Diarrhea slowed down, stool start to form at discharge.  Macrocytic Anemia: mcv 100, no overt sign of bleed, stool occult blood negative, b12 /tsh wnl , folate pending. Monitor cbc, Consider outpatient colonoscopy. Patient has never has colonoscopy in the past.  Deconditioning, recent multiple hospitalization and baseline obesity, Discharge to SNF  Morbid obesity: life style modification.  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  Code Status: Full code Family Communication: care discussed with patient Disposition Plan: SNF on 2/24   Consultants:  admitted to critical care service , transferred to hospitalist service on 2/21  Procedures:  ECHO   Doppler   CTA  Antibiotics: none  Discharge Exam: BP 131/64 mmHg  Pulse 73  Temp(Src) 98.6 F (37 C) (Oral)  Resp 20  Ht 6' (1.829 m)  Wt 148.6 kg (327 lb 9.7 oz)  BMI 44.42 kg/m2  SpO2 97%   General: NAD, obese  Cardiovascular: S 1 , S 2 RRR  Respiratory: diminished , no wheezing  Abdomen: BS present, soft, nt  Musculoskeletal: Bilateral edema, with dressing   Discharge Instructions You were cared for by a hospitalist during your hospital stay. If you have any questions about your discharge medications or the care you received while you were in the hospital after you are discharged, you can call the unit and asked to speak with the hospitalist  on call if the hospitalist that took care of you is not available. Once you are discharged, your primary care physician will handle any further medical issues. Please note that NO REFILLS for any discharge medications will be authorized once you are discharged, as it is imperative that you return to your primary care physician (or establish a relationship with a primary care physician if you do not have one) for your aftercare needs so that they can reassess your need for medications and monitor your lab values.      Discharge Instructions    Diet - low sodium heart healthy    Complete by:  As directed      Increase activity slowly    Complete by:  As directed             Medication List    STOP taking these medications        bisacodyl 10 MG suppository  Commonly known as:  DULCOLAX     docusate sodium 100 MG capsule  Commonly known as:  COLACE     mupirocin ointment 2 %  Commonly known  as:  BACTROBAN     ondansetron 4 MG disintegrating tablet  Commonly known as:  ZOFRAN ODT      TAKE these medications        amiodarone 200 MG tablet  Commonly known as:  PACERONE  Take 1 tablet (200 mg total) by mouth daily.     enoxaparin 150 MG/ML injection  Commonly known as:  LOVENOX  Inject 1 mL (150 mg total) into the skin every 12 (twelve) hours.     FLUoxetine 20 MG capsule  Commonly known as:  PROZAC  Take 1 capsule (20 mg total) by mouth daily.     furosemide 20 MG tablet  Commonly known as:  LASIX  Take 3 tablets (60 mg total) by mouth daily.     hydrocerin Crea  Apply 1 application topically daily.     potassium chloride 10 MEQ tablet  Commonly known as:  K-DUR  Take 2 tablets (20 mEq total) by mouth daily.     traZODone 50 MG tablet  Commonly known as:  DESYREL  Take 1 tablet (50 mg total) by mouth at bedtime.     vancomycin 250 MG capsule  Commonly known as:  VANCOCIN HCL  Take 1 capsule (250 mg total) by mouth 4 (four) times daily.     warfarin 5 MG  tablet  Commonly known as:  COUMADIN  Take 1 tablet (5 mg total) by mouth daily at 6 PM. Start from Saturday 2/25  Start taking on:  06/03/2015       No Known Allergies Follow-up Information    Follow up with [facility].   Why:  Please call for f/u appointment when discharged from SNF (please call day before to arrange transportation if you have not heard from clinic)   Contact information:   [address] [place] [phone]      Follow up with Katrina Stack, MD On 06/14/2015.   Specialty:  Surgery   Why:  at wound care center for chronic leg wound   Contact information:   [place] [address] [phone]       Follow up with [facility] In 1 month.   Specialty:  Cardiology   Why:  afib   Contact information:   [address], [place] [phone]      Follow up with [facility].   Specialty:  [place] information:   [address] [place] [phone]       The results of significant diagnostics from this hospitalization (including imaging, microbiology, ancillary and laboratory) are listed below for reference.    Significant Diagnostic Studies: Ct Abdomen Pelvis Wo Contrast  05/12/2015  CLINICAL DATA:  65 year old male with small bowel obstruction on plain radiographs. Initial encounter. EXAM: CT ABDOMEN AND PELVIS WITHOUT CONTRAST TECHNIQUE: Multidetector CT imaging of the abdomen and pelvis was performed following the standard protocol without IV contrast. COMPARISON:  Abdominal films 0453 hours today and earlier. FINDINGS: Consolidation with some air bronchograms in both posterior lower lobes, posterior basal segments. No associated pleural effusion. Elevation of the diaphragm. There is a superimposed small 3 mm lung nodule in the right  lower lobe on series 3, image 11. No pericardial effusion. Enteric tube in place, terminates in the body of the stomach. No acute osseous abnormality identified. Benign vertebral body hemangioma  in the right L5 level. Large body habitus. Foley catheter in the urinary bladder which is largely decompressed. Small volume gas within the bladder. No pelvic free fluid. Fluid in the rectum and sigmoid colon. Fluid in the largely decompressed left colon and splenic flexure. Gas distention of the transverse colon and hepatic hepatic flexure. There is fluid in the right colon. There is a trace amount of contrast layering in the cecum. The appendix is normal. The terminal ileum is decompressed. The distal small bowel is decompressed with a gradual transition to dilated air and fluid containing mid small bowel loops. There is occasional oral contrast in the distal decompressed an the mildly to moderately dilated mid small bowel loops. The largest small bowel loops are 5 cm diameter, and proximal loops contain dense oral contrast along with air. The stomach is dilated with oral contrast and some air. The duodenum is mildly dilated. There is a small volume of free fluid about the spleen, about proximal jejunal loops in the left upper quadrant, and layering along the left gutter. No pneumoperitoneum. Numerous fatty gallstones.  No pericholecystic inflammation. Negative noncontrast liver, spleen, pancreas, adrenal glands, and kidneys are within normal limits for age. Aortoiliac calcified atherosclerosis noted. No lymphadenopathy identified. There is nonspecific ventral abdominal wall fat stranding along the left flank and about the umbilicus. There is no abdominal wall hernia identified. IMPRESSION: 1. Dilated stomach and small bowel loops with a gradual transition to decompressed ileum. Fluid and some gas throughout the colon. Small volume mesenteric and left abdominal free fluid. No free air. 2. Constellation of findings favors  inflammatory related ileus over a mechanical bowel obstruction. 3. Enteric tube in place, terminates in the mid stomach. 4. Bilateral lower lobe consolidation, either pneumonia or severe atelectasis. No associated pleural effusion. 5. Nonspecific ventral and left flank body wall edema or inflammation. Query cellulitis. No abdominal wall hernia identified. 6. Cholelithiasis without CT evidence of acute cholecystitis. 7. Calcified aortic atherosclerosis. Electronically Signed   By: Genevie Ann M.D.   On: 05/12/2015 15:39   Dg Chest 2 View  05/27/2015  CLINICAL DATA:  Chest pain.  Syncope today. EXAM: CHEST  2 VIEW COMPARISON:  05/10/2015 FINDINGS: The cardiomediastinal contours are unchanged, heart at the upper limits in size. Previous left central line is no longer seen. Pulmonary vasculature is normal. No consolidation, pleural effusion, or pneumothorax. No acute osseous abnormalities are seen. IMPRESSION: No acute pulmonary process. Electronically Signed   By: Jeb Levering M.D.   On: 05/27/2015 18:00   Dg Chest 2 View  05/09/2015  CLINICAL DATA:  Speech syncope, lightheadedness EXAM: CHEST  2 VIEW COMPARISON:  None. FINDINGS: There is mild elevation of the right diaphragm. There is no focal parenchymal opacity. There is no pleural effusion or pneumothorax. The heart and mediastinal contours are unremarkable. The osseous structures are unremarkable. IMPRESSION: No active cardiopulmonary disease. Electronically Signed   By: Kathreen Devoid   On: 05/09/2015 12:30   Dg Abd 1 View  05/11/2015  CLINICAL DATA:  Evaluate nasogastric tube placement EXAM: ABDOMEN - 1 VIEW COMPARISON:  May 11, 2015 FINDINGS: Nasogastric tube is identified with distal tip in the stomach. There are multiple air-filled dilated small bowel loops. Left central venous line is identified with distal tip in the superior vena cava. IMPRESSION: Nasogastric tube distal tip in the stomach. Small bowel obstruction. Electronically Signed   By:  Abelardo Diesel M.D.   On: 05/11/2015 17:43   Ct Angio Chest Pe W/cm &/or Wo  Cm  05/27/2015  ADDENDUM REPORT: 05/27/2015 19:53 ADDENDUM: Positive for acute PE with CT evidence of right heart strain (RV/LV Ratio = 1.17) consistent with at least submassive (intermediate risk) PE. The presence of right heart strain has been associated with an increased risk of morbidity and mortality. Please activate Code PE by paging [phone]. Electronically Signed   By: Ilona Sorrel M.D.   On: 05/27/2015 19:53  05/27/2015  CLINICAL DATA:  Hypoxia.  Syncope.  Shortness of breath. EXAM: CT ANGIOGRAPHY CHEST WITH CONTRAST TECHNIQUE: Multidetector CT imaging of the chest was performed using the standard protocol during bolus administration of intravenous contrast. Multiplanar CT image reconstructions and MIPs were obtained to evaluate the vascular anatomy. CONTRAST:  186mL OMNIPAQUE IOHEXOL 350 MG/ML SOLN COMPARISON:  No prior chest CT. Chest radiograph from earlier today. 05/12/2015 CT abdomen. FINDINGS: Mediastinum/Nodes: The study is high quality for the evaluation of pulmonary embolism. There is a large burden of acute pulmonary embolism throughout the bilateral lobar, segmental and subsegmental pulmonary arterial tree involving all lung lobes. No main pulmonary artery or saddle pulmonary emboli. There is new dilatation of the main pulmonary artery (3.8 cm). Atherosclerotic nonaneurysmal thoracic aorta. There is new dilatation of the right ventricle and right atrium, with slight reflux of contrast into the intrahepatic IVC and hepatic veins. No significant pericardial fluid/thickening. Normal visualized thyroid. Normal esophagus. No pathologically enlarged axillary, mediastinal or hilar lymph nodes. Lungs/Pleura: No pneumothorax. No pleural effusion. Right lower lobe solid 4 mm pulmonary nodule (series 407/ image 57). No acute consolidative airspace disease or lung masses. Mosaic attenuation throughout both lungs is almost  certainly due to mosaic profusion due to pulmonary vascular disease. Upper abdomen: Unremarkable. Musculoskeletal: No aggressive appearing focal osseous lesions. Mild-to-moderate degenerative changes in the thoracic spine. Review of the MIP images confirms the above findings. IMPRESSION: 1. Acute large burden bilateral pulmonary embolism involving lobar, segmental and subsegmental branches of all lung lobes. No saddle pulmonary emboli. 2. New dilation of the main pulmonary artery indicating acute pulmonary arterial hypertension. 3. Evidence of right heart strain as described. 4. Right lower lobe 4 mm pulmonary nodule. If the patient is at high risk for bronchogenic carcinoma, follow-up chest CT at 1 year is recommended. If the patient is at low risk, no follow-up is needed. This recommendation follows the consensus statement: Guidelines for Management of Small Pulmonary Nodules Detected on CT Scans: A Statement from the [place] as published in Radiology 2005; 237:395-400. Critical Value/emergent results were called by telephone at the time of interpretation on 05/27/2015 at 7:42 pm to Dr. Zenovia Jarred , who verbally acknowledged these results. Electronically Signed: By: Ilona Sorrel M.D. On: 05/27/2015 19:44   US Renal  05/09/2015  CLINICAL DATA:  Acute renal failure. EXAM: RENAL / URINARY TRACT ULTRASOUND COMPLETE COMPARISON:  None. FINDINGS: Right Kidney: Length: 11 cm. Echogenicity within normal limits. No mass or hydronephrosis visualized. Left Kidney: Length: 12.8 cm. Echogenicity within normal limits. No mass or hydronephrosis visualized. Bladder: Decompressed secondary to Foley catheter. IMPRESSION: No renal abnormality seen. Electronically Signed   By: Marijo Conception, M.D.   On: 05/09/2015 16:00   Dg Chest Port 1 View  05/10/2015  CLINICAL DATA:  Central line placement, acute respiratory failure EXAM: PORTABLE CHEST 1 VIEW COMPARISON:  05/09/2015 FINDINGS: Cardiomediastinal silhouette is  stable. No segmental infiltrate or pulmonary edema. Mild infrahilar atelectasis or bronchitic changes. There is left IJ central line with tip in distal SVC. No pneumothorax. IMPRESSION: No infiltrate or pulmonary  edema. No pneumothorax. Left IJ central line in place. Mild infrahilar atelectasis or bronchitic changes. Electronically Signed   By: Lahoma Crocker M.D.   On: 05/10/2015 14:24   Dg Abd Portable 1v-small Bowel Obstruction Protocol-initial, 8 Hr Delay  05/12/2015  CLINICAL DATA:  NG tube evaluation. EXAM: PORTABLE ABDOMEN - 1 VIEW COMPARISON:  05/11/2015. FINDINGS: NG tube is noted coiled in stomach. Contrast is noted in the stomach. Persistent severely distended loops of small bowel and possibly large bowel again noted. Mild bowel wall thickening in noted in a right lower quadrant bowel loop. This can be seen with inflammatory or ischemic change. No free air identified . IMPRESSION: 1. NG tube noted coiled stomach. Contrast is noted within the stomach. 2. Persistent severely distended loops of small bowel and possibly large bowel again noted. No interim improvement. Thickening of bowel wall is noted a right lower quadrant bowel loop. This can be seen with inflammatory or ischemic change. No free air identified. Electronically Signed   By: Marcello Moores  Register   On: 05/12/2015 07:15   Dg Abd Portable 1v  05/11/2015  CLINICAL DATA:  65 year old with acute onset of mid and left upper and lower quadrant abdominal pain which began this morning, associated with bilious vomiting. EXAM: PORTABLE ABDOMEN - 1 VIEW COMPARISON:  None. FINDINGS: Gaseous distention of multiple loops of small bowel in the abdomen and upper pelvis. Gas within normal caliber colon. Moderate gaseous distention of the stomach. No suggestion of free air on the supine image. IMPRESSION: Small bowel obstruction. Electronically Signed   By: Evangeline Dakin M.D.   On: 05/11/2015 16:02    Microbiology: Recent Results (from the past 240 hour(s))    MRSA PCR Screening     Status: Abnormal   Collection Time: 05/27/15 10:11 PM  Result Value Ref Range Status   MRSA by PCR POSITIVE (A) NEGATIVE Final    Comment:        The GeneXpert MRSA Assay (FDA approved for NASAL specimens only), is one component of a comprehensive MRSA colonization surveillance program. It is not intended to diagnose MRSA infection nor to guide or monitor treatment for MRSA infections. RESULT CALLED TO, READ BACK BY AND VERIFIED WITH: RN Joaquin Bend [id number] @[id number]  THANEY   C difficile quick scan w PCR reflex     Status: Abnormal   Collection Time: 06/01/15  7:29 AM  Result Value Ref Range Status   C Diff antigen POSITIVE (A) NEGATIVE Final   C Diff toxin POSITIVE (A) NEGATIVE Final   C Diff interpretation Positive for toxigenic C. difficile  Final    Comment: CRITICAL RESULT CALLED TO, READ BACK BY AND VERIFIED WITH: A. MOFFITT RN 12:10 06/01/15 ([email or web address])      Labs: Basic Metabolic Panel:  Recent Labs Lab 05/29/15 0232 05/30/15 0211 05/31/15 0432 06/01/15 0510 06/02/15 0335  NA 140 141 140 142 140  K 3.4* 3.2* 3.3* 3.7 3.1*  CL 108 109 109 108 106  CO2 24 24 25 26 26   GLUCOSE 105* 97 99 100* 95  BUN 13 13 9 8 10   CREATININE 1.35* 1.40* 1.40* 1.45* 1.30*  CALCIUM 9.4 9.4 9.5 9.6 9.7  MG  --  2.2  --   --   --    Liver Function Tests:  Recent Labs Lab 05/27/15 1655  AST 20  ALT 27  ALKPHOS 79  BILITOT 0.6  PROT 5.7*  ALBUMIN 3.1*   No results for input(s): LIPASE, AMYLASE in the last 168 hours.  Recent Labs Lab 05/27/15 1655  AMMONIA 15   CBC:  Recent Labs Lab 05/27/15 1655 05/28/15 0450 05/29/15 0232 05/30/15 0211 05/31/15 0432 06/01/15 0510  WBC 5.6 5.3 6.1 5.3 4.8 4.8  NEUTROABS 3.5  --   --   --   --   --   HGB 8.9* 8.2* 8.1* 8.1* 8.4* 8.9*  HCT 29.6* 27.3* 26.9* 26.8* 27.3* 29.7*  MCV 100.3* 101.1* 101.5* 101.1* 101.9* 100.3*  PLT 265 236 213 197 178 193   Cardiac Enzymes: No results for input(s):  CKTOTAL, CKMB, CKMBINDEX, TROPONINI in the last 168 hours. BNP: BNP (last 3 results)  Recent Labs  05/27/15 1655  BNP 140.4*    ProBNP (last 3 results) No results for input(s): PROBNP in the last 8760 hours.  CBG:  Recent Labs Lab 05/27/15 2157  GLUCAP 79       Signed:  Jaque Dacy MD, PhD  [facility] 06/02/2015, 12:21 PM

## 2015-06-01 NOTE — Clinical Social Work Placement (Signed)
   CLINICAL SOCIAL WORK PLACEMENT  NOTE  Date:  06/01/2015  Patient Details  Name: Scott Shelton MRN: [id number] Date of Birth: 1951-01-19  Clinical Social Work is seeking post-discharge placement for this patient at the [place] level of care (*CSW will initial, date and re-position this form in  chart as items are completed):  Yes   Patient/family provided with [facility] Work Department's list of facilities offering this level of care within the geographic area requested by the patient (or if unable, by the patient's family).  Yes   Patient/family informed of their freedom to choose among providers that offer the needed level of care, that participate in Medicare, Medicaid or managed care program needed by the patient, have an available bed and are willing to accept the patient.  Yes   Patient/family informed of [facility]'s ownership interest in [facility] and [facility], as well as of the fact that they are under no obligation to receive care at these facilities.  PASRR submitted to EDS on       PASRR number received on       Existing PASRR number confirmed on 06/01/15     FL2 transmitted to all facilities in geographic area requested by pt/family on 06/01/15     FL2 transmitted to all facilities within larger geographic area on       Patient informed that his/her managed care company has contracts with or will negotiate with certain facilities, including the following:            Patient/family informed of bed offers received.  Patient chooses bed at       Physician recommends and patient chooses bed at      Patient to be transferred to   on  .  Patient to be transferred to facility by       Patient family notified on   of transfer.  Name of family member notified:        PHYSICIAN Please sign FL2     Additional Comment:    _______________________________________________ Cranford Mon, LCSW 06/01/2015, 2:30 PM

## 2015-06-01 NOTE — Clinical Social Work Note (Signed)
Clinical Social Work Assessment  Patient Details  Name: Scott Shelton MRN: [id number] Date of Birth: 1950-08-04  Date of referral:  06/01/15               Reason for consult:  Facility Placement                Permission sought to share information with:  Family Supports Permission granted to share information::  Yes, Verbal Permission Granted  Name::     Lenny Pastel  Agency::  [facility]  Relationship::  brothers  Contact Information:     Housing/Transportation Living arrangements for the past 2 months:  Apartment Source of Information:  Patient Patient Interpreter Needed:  None Criminal Activity/Legal Involvement Pertinent to Current Situation/Hospitalization:  No - Comment as needed Significant Relationships:  Siblings, Parents Lives with:  Self Do you feel safe going back to the place where you live?  No Need for family participation in patient care:  No (Coment)  Care giving concerns: Pt lives alone after his father with dementia was recently placed in SNF- pt has very limited support and has no one who can stay with him to offer physical support at home.   Social Worker assessment / plan:  CSW spoke with pt concerning disposition plan at this time.  Pt was discharged late last week and was not able to take care of needs at home due to mobility issues and lack of transportation to get medications.  Pt reports that his mobility has been impaired since Christmas but prior to that he and his dad were walking everyday and running errands without noticeable impairment.  Since pt father is in SNF now the patient does not have any source of income- he was living off of his fathers social security check.  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  Pt has high level of anxiety about the future and what will happen with his medical care and with his financial state.  Employment status:  Retired Forensic scientist:  Self Pay  (Medicaid Pending) PT Recommendations:  [place] / Referral to community resources:  [place]  Patient/Family's Response to care:  Pt is agreeable to SNF placement and is appreciative of any help that we can give.  Patient/Family's Understanding of and Emotional Response to Diagnosis, Current Treatment, and Prognosis:  No questions or concerns at this time  Emotional Assessment Appearance:  Appears stated age Attitude/Demeanor/Rapport:    Affect (typically observed):  Appropriate, Accepting Orientation:  Oriented to Self, Oriented to Place, Oriented to  Time, Oriented to Situation Alcohol / Substance use:  Not Applicable Psych involvement (Current and /or in the community):  No (Comment)  Discharge Needs  Concerns to be addressed:  Home Safety Concerns, Lack of Support Readmission within the last 30 days:  Yes Current discharge risk:  Physical Impairment, Lives alone, Lack of support system Barriers to Discharge:  Continued Medical Work up   [employer], LCSW 06/01/2015, 2:19 PM

## 2015-06-01 NOTE — Care Management Note (Signed)
Case Management Note Previous CM note initiated by Elenor Quinones RN, CM  Patient Details  Name: Scott Shelton MRN: [id number] Date of Birth: 1951-03-06  Subjective/Objective:                  Action/Plan:  Pt recently discharged home with home health provided by [facility].  Pt already set up with initial appt at [facility] however CM will call and reschedule due to current hospitalization.  CM contacted [facility] to see if services can assist with transportation to PCP appts.  CM contacted [facility] to inform of admit and will request resumption orders prior to discharge.  CM will continue to monitor.     Expected Discharge Date:                  Expected Discharge Plan:  [place]  In-House Referral:  Development worker, community, Clinical Social Work  Discharge planning Services  CM Consult  Post Acute Care Choice:  Resumption of Svcs/PTA Provider, Home Health Choice offered to:  Patient  DME Arranged:    DME Agency:     HH Arranged:  RN, Social Work, PT [place] Agency:  [place]  Status of Service:  In process, will continue to follow  Medicare Important Message Given:    Date Medicare IM Given:    Medicare IM give by:    Date Additional Medicare IM Given:    Additional Medicare Important Message give by:     If discussed at [place] of Stay Meetings, dates discussed:  06/01/15  Additional Comments:   06/01/15- 1530- Marvetta Gibbons RN, BSN -  Pt discussed in [facility] this am- feel like it would be in best interest of pt to do STSNF placement with f/u on Medicaid application for possible long term placement for either ALF or SNF due to pt complex needs. Pt now has + c-diff and will need to be on tx prior to going to a facility. CSW working on placement- both [place] [facility] and this CM spoke with pt at bedside who is agreeable to STSNF - per conversation pt states that he was fairly mobile including driving prior to Christmas- and that his difficulties started after the first of the year.  Informed pt that this CM had spoken with Quita Skye in the [facility] office regarding his financial application- per Quita Skye this was submitted on Monday 05/29/15- and they mailed pt a letter regarding items needed to completed the application process.  Appointment at [facility] canceled for 2/24- as pt not ready for d/c- will reassess f/u when pt medically ready for discharge and bed has been found.   05/31/15- Marvetta Gibbons RN, BSN  Appointment scheduled for 06/02/15 with [facility].  CM spoke with [facility] liason and was informed that pt will be able to gain transportation assistance from clinic, pt should inform the clinic during the confirmation appt call that he needs transportaion.  CM will communicate this to pt, in conversation with pt at bedside- pt states that he usually is transported home vis EMS- CSW can assist with this if brother can not transport pt home- discussed transportation to clinic- pt voices understanding that clinic can assist with this- will make sure clinic has pt's correct phone #- cell [phone]. Also discussed medication needs- pt states that he does not have the income right now to get medications still has his scripts with him from his last discharge from hospital- pt is eligible for [facility] assistance which has $3 copay per script- pt  states that he would not even be able to afford that- will see if pt can be approved for override of copay cost. Pt is to see if someone can go to pharmacy for him to pick up scripts- otherwise may need to use [facility] and pick up scripts for pt prior to pt leaving via EMS. Pt was active with [facility] and agreeable to resume [facility] services- MD aware to resume orders for [facility].  Also provided pt a Medicaid application- per CSW - FC have looked into this for pt on last hospital stay and do not feel pt will qualify- however- explained to pt that he can go to DSS and speak with someone regarding applying.   Dahlia Client [place], RN 06/01/2015, 2:24 PM

## 2015-06-01 NOTE — Progress Notes (Signed)
ANTICOAGULATION CONSULT NOTE - Follow Up Consult  Pharmacy Consult for heparin >> lovenox/warfarin Indication: bilateral PEs  No Known Allergies  Patient Measurements: Height: 6' (182.9 cm) Weight: (!) 333 lb 1.6 oz (151.093 kg) IBW/kg (Calculated) : 77.6 Heparin Dosing Weight: 113 kg  Vital Signs: Temp: 98.2 F (36.8 C) (02/23 0329) Temp Source: Oral (02/23 0329) BP: 126/62 mmHg (02/23 0329) Pulse Rate: 77 (02/23 0329)  Labs:  Recent Labs  05/30/15 0211 05/30/15 1139 05/30/15 1840 05/31/15 0432 05/31/15 1101 06/01/15 0510  HGB 8.1*  --   --  8.4*  --  8.9*  HCT 26.8*  --   --  27.3*  --  29.7*  PLT 197  --   --  178  --  193  LABPROT  --   --   --   --  15.4* 16.7*  INR  --   --   --   --  1.21 1.34  HEPARINUNFRC 0.29* 0.39 0.44 0.55  --   --   CREATININE 1.40*  --   --  1.40*  --  1.45*    Estimated Creatinine Clearance: 79.9 mL/min (by C-G formula based on Cr of 1.45).   Assessment: 65 yo m admitted with SOB and syncope, CTA showed bilateral submassive PE with R heart strain. Transitioned to lovenox/warfarin due to lack of insurance coverage. INR 1.21>>1.34. Also on amiodarone, which may make INR difficult to predict at first. Hg low stable 8.9, plt wnl. No bleed issues. Need at least 5 days of overlap - Day #2. Wt ~151kg.  Goal of Therapy:  PE treatment Monitor platelets by anticoagulation protocol: Yes   Plan: -Lovenox 150mg  [place] q12h - D#2/5 of overlap -Warfarin 5mg  x1 dose tonight -Daily INR, CBC at least q72h -Mon s/sx bleeding  Elicia Lamp, PharmD, [facility] Clinical Pharmacist Pager [phone] 06/01/2015 10:33 AM

## 2015-06-01 NOTE — NC FL2 (Signed)
[place] LEVEL OF CARE SCREENING TOOL     IDENTIFICATION  Patient Name: Scott Shelton Birthdate: Mar 05, 1951 Sex: male Admission Date (Current Location): 05/27/2015  [facility] Community and [place] Number:  Herbalist and Address:  [facility] [place], [place] [address], [place],  [address]      Provider Number: [id number]  Attending Physician Name and Address:  Florencia Reasons, MD  Relative Name and Phone Number:       Current Level of Care: Hospital Recommended Level of Care: [place] Prior Approval Number:    Date Approved/Denied:   PASRR Number: [id number]  Discharge Plan: SNF    Current Diagnoses: Patient Active Problem List   Diagnosis Date Noted  . PE (pulmonary embolism) 05/27/2015  . MDD (major depressive disorder), single episode, severe , no psychosis ([place]) 05/19/2015  . Adjustment disorder with mixed anxiety and depressed mood 05/19/2015  . Cellulitis of right lower extremity   . LVH (left ventricular hypertrophy)   . Acute renal failure ([place])   . Septic shock ([place])   . Atrial fibrillation ([place])   . Ileus ([place])   . Hypokalemia   . ARF (acute renal failure) ([place])   . AKI (acute kidney injury) ([place]) 05/09/2015  . Hyperkalemia 05/09/2015  . Lactic acidosis 05/09/2015  . Hypotension 05/09/2015  . Hypothermia 05/09/2015  . Hyponatremia 05/09/2015  . Syncope 05/09/2015  . GERD (gastroesophageal reflux disease)   . Cellulitis   . Elevated troponin     Orientation RESPIRATION BLADDER Height & Weight     Self, Time, Situation, Place  Normal Continent Weight: (!) 333 lb 1.6 oz (151.093 kg) Height:  6' (182.9 cm)  BEHAVIORAL SYMPTOMS/MOOD NEUROLOGICAL BOWEL NUTRITION STATUS      Continent Diet (cardiac)  AMBULATORY STATUS COMMUNICATION OF NEEDS Skin   Limited Assist Verbally Other (Comment) (leg edema)                       Personal Care Assistance Level of Assistance  Bathing, Dressing Bathing  Assistance: Limited assistance   Dressing Assistance: Limited assistance     Functional Limitations Info             SPECIAL CARE FACTORS FREQUENCY  PT (By licensed PT), OT (By licensed OT)     PT Frequency: 5/wk OT Frequency: 5/wk            Contractures      Additional Factors Info            Isolation Precautions Info: Enteric     Current Medications (06/01/2015):  This is the current hospital active medication list Current Facility-Administered Medications  Medication Dose Route Frequency Provider Last Rate Last Dose  . 0.9 %  sodium chloride infusion  250 mL Intravenous PRN Guy Begin, MD      . alum & mag hydroxide-simeth (MAALOX/MYLANTA) 200-200-20 MG/5ML suspension 30 mL  30 mL Oral PRN Guy Begin, MD   30 mL at 05/31/15 2006  . amiodarone (PACERONE) tablet 200 mg  200 mg Oral Daily Donita Brooks, NP   200 mg at 06/01/15 1040  . chlorhexidine (PERIDEX) 0.12 % solution 15 mL  15 mL Mouth Rinse BID Raylene Miyamoto, MD   15 mL at 06/01/15 1040  . enoxaparin (LOVENOX) injection 150 mg  150 mg Subcutaneous Q12H Romona Curls, RPH   150 mg at 06/01/15 1335  . hydrocerin (EUCERIN) cream   Topical Once  per day on Mon Wed Fri Cherene Altes, MD      . mupirocin ointment (BACTROBAN) 2 % 1 application  1 application Nasal BID Raylene Miyamoto, MD   1 application at [id number] 1040  . ondansetron (ZOFRAN) injection 4 mg  4 mg Intravenous Q6H PRN Guy Begin, MD   4 mg at 05/27/15 2048  . senna-docusate (Senokot-S) tablet 1 tablet  1 tablet Oral QHS PRN Guy Begin, MD      . traZODone (DESYREL) tablet 50 mg  50 mg Oral QHS Guy Begin, MD   50 mg at 05/31/15 2304  . warfarin (COUMADIN) tablet 5 mg  5 mg Oral ONCE-1800 Romona Curls, [facility]      . Warfarin - Pharmacist Dosing Inpatient   Does not apply Paxton Baird, [facility]         Discharge Medications: Please see discharge summary for a list of discharge medications.  Relevant  Imaging Results:  Relevant Lab Results:   Additional Information SS#: [id number]  [place]ranford Mon, [place]

## 2015-06-01 NOTE — Progress Notes (Signed)
Pt will have bed tomorrow (2/24) at [facility].  Patient will need to leave hospital by 1pm in order to get to facility and complete paperwork.  Pt informed and is agreeable to placement  CSW paged MD to update  CSW will continue to follow  Domenica Reamer, [place] Social Worker [phone]

## 2015-06-01 NOTE — Progress Notes (Signed)
Received call from lab that pt's stool sample is positive for c-diff. Precautions will remain in place.   Grant Fontana RN, BSN

## 2015-06-01 NOTE — Progress Notes (Signed)
Physical Therapy Treatment Patient Details Name: Scott Shelton MRN: [id number] DOB: 08-13-1950 Today's Date: 06/01/2015    History of Present Illness Pt admitted after syncopal event with bil PE, LE edema.  Pt hospitalized in January with septic shock, R LE cellulitis and syncope.    PT Comments    Patient progressing slowly towards PT goals. No dizziness today. Tolerated gait training with chair follow due to fatigue. Ambulating short distances with RW. Increased time to perform all mobility. Encouraged increasing activity while in hospital and sitting in chair as much as tolerated. Will continue to follow per current POC.   Follow Up Recommendations  SNF     Equipment Recommendations  None recommended by PT    Recommendations for Other Services       Precautions / Restrictions Precautions Precautions: Fall Precaution Comments: BLE wounds Restrictions Weight Bearing Restrictions: No    Mobility  Bed Mobility Overal bed mobility: Needs Assistance Bed Mobility: Supine to Sit     Supine to sit: Modified independent (Device/Increase time);HOB elevated     General bed mobility comments: No assist needed. Reliant on bed rails. No dizziness today.  Transfers Overall transfer level: Needs assistance Equipment used: Rolling walker (2 wheeled) Transfers: Sit to/from Stand Sit to Stand: Min guard         General transfer comment: 2 attempts to stand with use of body momentum. Min guard for safety. Stood from [employer], from chair x1. Stayed in chair post ambulation.  Ambulation/Gait Ambulation/Gait assistance: Min guard Ambulation Distance (Feet): 24 Feet (+12') Assistive device: Rolling walker (2 wheeled) Gait Pattern/deviations: Step-through pattern;Decreased stride length;Wide base of support Gait velocity: decreased Gait velocity interpretation: Below normal speed for age/gender General Gait Details: Slow, steady gait. Fatigues quickly. 2/4 DOE. 1 seated rest  break.   Stairs            Wheelchair Mobility    Modified Rankin (Stroke Patients Only)       Balance Overall balance assessment: Needs assistance Sitting-balance support: Feet supported;No upper extremity supported Sitting balance-Leahy Scale: Good     Standing balance support: During functional activity Standing balance-Leahy Scale: Poor Standing balance comment: Reliant on BUEs for support.                     Cognition Arousal/Alertness: Awake/alert Behavior During Therapy: Flat affect Overall Cognitive Status: Within Functional Limits for tasks assessed                      Exercises      General Comments        Pertinent Vitals/Pain Pain Assessment: Faces Faces Pain Scale: Hurts a little bit Pain Location: left foot Pain Descriptors / Indicators: Sore Pain Intervention(s): Monitored during session;Repositioned    Home Living                      Prior Function            PT Goals (current goals can now be found in the care plan section) Progress towards PT goals: Progressing toward goals    Frequency  Min 3X/week    PT Plan Current plan remains appropriate    Co-evaluation             End of Session   Activity Tolerance: Patient limited by fatigue Patient left: in chair;with call bell/phone within reach     Time: 0927-0952 PT Time Calculation (min) (ACUTE ONLY): 25 min  Charges:  $Gait Training: 8-22 mins $Therapeutic Activity: 8-22 mins                    G Codes:      Shenekia Riess A Shatisha Falter 06/01/2015, 10:13 AM  Wray Kearns, PT, DPT [phone]

## 2015-06-02 ENCOUNTER — Inpatient Hospital Stay: Payer: Self-pay

## 2015-06-02 DIAGNOSIS — N183 Chronic kidney disease, stage 3 unspecified: Secondary | ICD-10-CM | POA: Insufficient documentation

## 2015-06-02 DIAGNOSIS — A0472 Enterocolitis due to Clostridium difficile, not specified as recurrent: Secondary | ICD-10-CM | POA: Insufficient documentation

## 2015-06-02 DIAGNOSIS — I272 Pulmonary hypertension, unspecified: Secondary | ICD-10-CM | POA: Insufficient documentation

## 2015-06-02 DIAGNOSIS — I890 Lymphedema, not elsewhere classified: Secondary | ICD-10-CM | POA: Insufficient documentation

## 2015-06-02 DIAGNOSIS — J9601 Acute respiratory failure with hypoxia: Secondary | ICD-10-CM | POA: Insufficient documentation

## 2015-06-02 LAB — PROTIME-INR
INR: 1.36 (ref 0.00–1.49)
PROTHROMBIN TIME: 16.9 s — AB (ref 11.6–15.2)

## 2015-06-02 LAB — FOLATE RBC
FOLATE, HEMOLYSATE: 400.6 ng/mL
FOLATE, RBC: 1473 ng/mL (ref >498)
HEMATOCRIT: 27.2 % — AB (ref 37.5–51.0)

## 2015-06-02 LAB — BASIC METABOLIC PANEL
ANION GAP: 8 (ref 5–15)
BUN: 10 mg/dL (ref 6–20)
CALCIUM: 9.7 mg/dL (ref 8.9–10.3)
CO2: 26 mmol/L (ref 22–32)
Chloride: 106 mmol/L (ref 101–111)
Creatinine, Ser: 1.3 mg/dL — ABNORMAL HIGH (ref 0.61–1.24)
GFR, EST NON AFRICAN AMERICAN: 57 mL/min — AB (ref >60)
Glucose, Bld: 95 mg/dL (ref 65–99)
POTASSIUM: 3.1 mmol/L — AB (ref 3.5–5.1)
Sodium: 140 mmol/L (ref 135–145)

## 2015-06-02 LAB — OCCULT BLOOD X 1 CARD TO LAB, STOOL: FECAL OCCULT BLD: NEGATIVE

## 2015-06-02 MED ORDER — WARFARIN SODIUM 7.5 MG PO TABS
7.5000 mg | ORAL_TABLET | Freq: Once | ORAL | Status: AC
  Administered 2015-06-02: 7.5 mg via ORAL
  Filled 2015-06-02: qty 1

## 2015-06-02 MED ORDER — POTASSIUM CHLORIDE ER 10 MEQ PO TBCR: 20.0000 meq | EXTENDED_RELEASE_TABLET | Freq: Every day | ORAL | Status: DC

## 2015-06-02 MED ORDER — FUROSEMIDE 20 MG PO TABS: 60.0000 mg | ORAL_TABLET | Freq: Every day | ORAL | Status: DC

## 2015-06-02 MED ORDER — FLUOXETINE HCL 20 MG PO CAPS: 20.0000 mg | ORAL_CAPSULE | Freq: Every day | ORAL | Status: DC

## 2015-06-02 MED ORDER — TRAZODONE HCL 50 MG PO TABS: 50.0000 mg | ORAL_TABLET | Freq: Every day | ORAL | Status: DC

## 2015-06-02 MED ORDER — WARFARIN SODIUM 5 MG PO TABS: 5.0000 mg | ORAL_TABLET | Freq: Every day | ORAL | Status: DC

## 2015-06-02 MED ORDER — POTASSIUM CHLORIDE CRYS ER 20 MEQ PO TBCR
40.0000 meq | EXTENDED_RELEASE_TABLET | Freq: Once | ORAL | Status: AC
  Administered 2015-06-02: 40 meq via ORAL
  Filled 2015-06-02: qty 2

## 2015-06-02 NOTE — Progress Notes (Signed)
Patient will DC to: [facility] Anticipated DC date: 06/02/15 Family notified: Patient oriented Transport by: [facility] 12:30pm  CSW signing off.  Cedric Fishman, [place] Social Worker [phone]

## 2015-06-02 NOTE — Progress Notes (Signed)
Pt is to receive coumadin 7.5 mg prior to discharge per MD order. Transport is arranged to pick up pt shortly, so pt will be given coumadin dose now. Pharmacy has been notified and dosing time has been moved.   Grant Fontana RN, BSN

## 2015-06-02 NOTE — Progress Notes (Signed)
Pt missed 0000 scheduled doses of PO Vancomycin and SQ Lovenox due to a delay receiving the medication from pharmacy.  Doses given at 0644, RN notified pharmacy that future dose times need to be adjusted, pt call bell within reach and bed in lowest position.    Izola Price, RN 06/02/2015

## 2015-06-02 NOTE — Progress Notes (Signed)
Pt is being discharged to [facility] via EMS transport. Pt's IV and telemetry box were removed prior to transport, with no complications. Pt left the floor via stretcher and was accompanied by EMS transporters. Pt received 7.5 mg of coumadin prior to transport per MD order. Pt was in no distress at time of discharge. Report was called to [facility] and all questions were answered. [name] was informed that pt was not to receive next dose of lovenox until tomorrow morning per pharmacy order. Pt's eucerin and prescriptions were sent with pt to nursing facility.   Grant Fontana RN, BSN

## 2015-06-02 NOTE — Clinical Social Work Placement (Signed)
   CLINICAL SOCIAL WORK PLACEMENT  NOTE  Date:  06/02/2015  Patient Details  Name: Scott Shelton MRN: [id number] Date of Birth: [birth year]  Clinical Social Work is seeking post-discharge placement for this patient at the [place] level of care (*CSW will initial, date and re-position this form in  chart as items are completed):  Yes   Patient/family provided with [place] Work Department's list of facilities offering this level of care within the geographic area requested by the patient (or if unable, by the patient's family).  Yes   Patient/family informed of their freedom to choose among providers that offer the needed level of care, that participate in Medicare, Medicaid or managed care program needed by the patient, have an available bed and are willing to accept the patient.  Yes   Patient/family informed of [facility]'s ownership interest in [facility] and [facility], as well as of the fact that they are under no obligation to receive care at these facilities.  PASRR submitted to EDS on       PASRR number received on       Existing PASRR number confirmed on 06/01/15     FL2 transmitted to all facilities in geographic area requested by pt/family on 06/01/15     FL2 transmitted to all facilities within larger geographic area on       Patient informed that his/her managed care company has contracts with or will negotiate with certain facilities, including the following:            Patient/family informed of bed offers received.  Patient chooses bed at [facility]     Physician recommends and patient chooses bed at      Patient to be transferred to [facility] on 06/02/15.  Patient to be transferred to facility by [facility]     Patient family notified on 06/02/15 of transfer.  Name of family member notified:  N/A     PHYSICIAN Please sign FL2     Additional Comment:     _______________________________________________ Benard Halsted, [place] 06/02/2015, 10:55 AM

## 2015-06-02 NOTE — Progress Notes (Signed)
Occupational Therapy Treatment Patient Details Name: Scott Shelton MRN: [id number] DOB: 03/11/1951 Today's Date: 06/02/2015    History of present illness Pt admitted after syncopal event with bil PE, LE edema.  Pt hospitalized in January with septic shock, R LE cellulitis and syncope.   OT comments  Pt issued and educated in use of adaptive equipment for LB ADL. Grateful to be going to SNF.  Pt educated in importance of OOB activity and ambulation with assist of staff.  Follow Up Recommendations  Supervision/Assistance - 24 hour;SNF    Equipment Recommendations       Recommendations for Other Services      Precautions / Restrictions Precautions Precautions: Fall Precaution Comments: BLE wounds       Mobility Bed Mobility Overal bed mobility: Needs Assistance Bed Mobility: Supine to Sit;Sit to Supine     Supine to sit: Modified independent (Device/Increase time);HOB elevated Sit to supine: Min assist   General bed mobility comments: assist for LEs back into bed  Transfers Overall transfer level: Needs assistance Equipment used: Rolling walker (2 wheeled) Transfers: Sit to/from Stand Sit to Stand: Min guard         General transfer comment: encouraged momentum, assist to steady    Balance                                   ADL Overall ADL's : Needs assistance/impaired               Lower Body Bathing Details (indicate cue type and reason): instructed in use of long handled bath sponge     Lower Body Dressing: Minimal assistance;Sit to/from stand Lower Body Dressing Details (indicate cue type and reason): instructed in use of reacher, sock aid, long shoe horn                       Vision                     Perception     Praxis      Cognition   Behavior During Therapy: WFL for tasks assessed/performed Overall Cognitive Status: Within Functional Limits for tasks assessed                        Extremity/Trunk Assessment               Exercises     Shoulder Instructions       General Comments      Pertinent Vitals/ Pain       Pain Assessment: Faces Faces Pain Scale: Hurts a little bit Pain Location: L foot Pain Descriptors / Indicators: Grimacing Pain Intervention(s): Limited activity within patient's tolerance;Monitored during session  Home Living                                          Prior Functioning/Environment              Frequency Min 2X/week     Progress Toward Goals  OT Goals(current goals can now be found in the care plan section)  Progress towards OT goals: Progressing toward goals  Acute Rehab OT Goals Patient Stated Goal: to walk and return to caring for himself  Plan Discharge plan remains appropriate    Co-evaluation  End of Session Equipment Utilized During Treatment: Rolling walker   Activity Tolerance Patient tolerated treatment well   Patient Left in bed;with call bell/phone within reach   Nurse Communication          Time: [id number] OT Time Calculation (min): 35 min  Charges: OT General Charges $OT Visit: 1 Procedure OT Treatments $Self Care/Home Management : 23-37 mins  Malka So 06/02/2015, 9:41 AM  [phone]

## 2015-06-02 NOTE — Care Management Note (Signed)
Case Management Note Previous CM note initiated by Elenor Quinones RN, CM  Patient Details  Name: Scott Shelton MRN: [id number] Date of Birth: 03/03/1951  Subjective/Objective:                  Action/Plan:  Pt recently discharged home with home health provided by [facility].  Pt already set up with initial appt at [facility] however CM will call and reschedule due to current hospitalization.  CM contacted [facility] to see if services can assist with transportation to PCP appts.  CM contacted [facility] to inform of admit and will request resumption orders prior to discharge.  CM will continue to monitor.     Expected Discharge Date:    06/02/15              Expected Discharge Plan:  [place]  In-House Referral:  Financial Counselor, Clinical Social Work  Discharge planning Services  CM Consult  Post Acute Care Choice:  Resumption of Svcs/PTA Provider, Home Health Choice offered to:  Patient  DME Arranged:    DME Agency:     HH Arranged:  RN, Social Work, PT [place] Agency:  [place]  Status of Service:  Completed, signed off  Medicare Important Message Given:    Date Medicare IM Given:    Medicare IM give by:    Date Additional Medicare IM Given:    Additional Medicare Important Message give by:     If discussed at [place] of Stay Meetings, dates discussed:  06/01/15  Discharge Disposition: Skilled Facility   Additional Comments:   06/02/15- 7- Marvetta Gibbons RN, BSN-  CSW has made arrangements for STSNF- pt cleared for discharge today- pt will not need assistance with medication since discharging to SNF- pt will need f/u appointment rescheduled with [facility] upon discharge from facility have placed this on the AVS.   06/01/15- 1530- Marvetta Gibbons RN, BSN -  Pt discussed in [facility] this am- feel like it would be in best interest of pt to do STSNF placement with f/u on Medicaid application for possible long term placement for either ALF or SNF due to pt complex needs. Pt now  has + c-diff and will need to be on tx prior to going to a facility. CSW working on placement- both [place] [facility] and this CM spoke with pt at bedside who is agreeable to STSNF - per conversation pt states that he was fairly mobile including driving prior to Christmas- and that his difficulties started after the first of the year. Informed pt that this CM had spoken with [place] in the [facility] office regarding his financial application- per [place] Skye this was submitted on Monday 05/29/15- and they mailed pt a letter regarding items needed to completed the application process.  Appointment at [facility] canceled for 2/24- as pt not ready for d/c- will reassess f/u when pt medically ready for discharge and bed has been found.   05/31/15- Marvetta Gibbons RN, BSN  Appointment scheduled for 06/02/15 with [facility].  CM spoke with [facility] liason and was informed that pt will be able to gain transportation assistance from clinic, pt should inform the clinic during the confirmation appt call that he needs transportaion.  CM will communicate this to pt, in conversation with pt at bedside- pt states that he usually is transported home vis EMS- CSW can assist with this if brother can not transport pt home- discussed transportation to clinic- pt voices understanding that clinic can assist with this- will make  sure clinic has pt's correct phone #- cell [phone]. Also discussed medication needs- pt states that he does not have the income right now to get medications still has his scripts with him from his last discharge from hospital- pt is eligible for [facility] assistance which has $3 copay per script- pt states that he would not even be able to afford that- will see if pt can be approved for override of copay cost. Pt is to see if someone can go to pharmacy for him to pick up scripts- otherwise may need to use [facility] and pick up scripts for pt prior to pt leaving via EMS. Pt was active with [facility] and agreeable to resume [facility] services- MD aware to  resume orders for [facility].  Also provided pt a Medicaid application- per CSW - FC have looked into this for pt on last hospital stay and do not feel pt will qualify- however- explained to pt that he can go to DSS and speak with someone regarding applying.   Dawayne Patricia, RN 06/02/2015, 11:13 AM

## 2015-06-02 NOTE — Progress Notes (Signed)
Spoke with Roque Lias, RN about PO vancomycin. I asked if she looked in fridge and she told me she did and the vancomycin was not there. I went to verify for myself and found 5 doses of vancomycin including the scheduled dose for midnight and 06:00 plus the doses sent when she notified.   [place], Pharm.D., BCPS Clinical Pharmacist Pager: [phone] 06/02/2015 8:40 AM

## 2015-06-02 NOTE — Progress Notes (Signed)
Lovenox dose from midnight was delayed as RN reports she could not find it. It was given at 06:41. Next dose was due at noon today and was also given. Spoke with RN and asked her to report to SNF to delay next dose of Lovenox until 2/25 at 06:00. SZP filed.  [place], Pharm.D., BCPS Clinical Pharmacist Pager: [phone] 06/02/2015 1:09 PM

## 2015-06-14 ENCOUNTER — Encounter (HOSPITAL_BASED_OUTPATIENT_CLINIC_OR_DEPARTMENT_OTHER): Payer: Self-pay | Attending: Surgery

## 2015-06-14 DIAGNOSIS — I272 Other secondary pulmonary hypertension: Secondary | ICD-10-CM | POA: Insufficient documentation

## 2015-06-14 DIAGNOSIS — Z6841 Body Mass Index (BMI) 40.0 and over, adult: Secondary | ICD-10-CM | POA: Insufficient documentation

## 2015-06-14 DIAGNOSIS — L97511 Non-pressure chronic ulcer of other part of right foot limited to breakdown of skin: Secondary | ICD-10-CM | POA: Insufficient documentation

## 2015-06-14 DIAGNOSIS — N189 Chronic kidney disease, unspecified: Secondary | ICD-10-CM | POA: Insufficient documentation

## 2015-06-14 DIAGNOSIS — I739 Peripheral vascular disease, unspecified: Secondary | ICD-10-CM | POA: Insufficient documentation

## 2015-06-14 DIAGNOSIS — Z86711 Personal history of pulmonary embolism: Secondary | ICD-10-CM | POA: Insufficient documentation

## 2015-06-14 DIAGNOSIS — I890 Lymphedema, not elsewhere classified: Secondary | ICD-10-CM | POA: Insufficient documentation

## 2015-06-22 ENCOUNTER — Ambulatory Visit: Payer: Self-pay | Attending: Internal Medicine | Admitting: Occupational Therapy

## 2015-06-22 ENCOUNTER — Encounter: Payer: Self-pay | Admitting: Occupational Therapy

## 2015-06-22 VITALS — Ht 72.0 in | Wt 329.0 lb

## 2015-06-22 DIAGNOSIS — I890 Lymphedema, not elsewhere classified: Secondary | ICD-10-CM | POA: Insufficient documentation

## 2015-06-22 NOTE — Therapy (Signed)
[place] MAIN [facility] SERVICES [address] [place], [place], [address] Phone: [phone]   Fax:  [phone]  Occupational Therapy Evaluation  Patient Details  Name: Scott Shelton MRN: [id number] Date of Birth: 08-Apr-1951 No Data Recorded  Encounter Date: 06/22/2015      OT End of Session - 06/22/15 1043    Visit Number 1   Number of Visits 36   Date for OT Re-Evaluation 09/20/15   OT Start Time 0910   OT Stop Time 1010   OT Time Calculation (min) 60 min   Equipment Utilized During Treatment LE Workbook   Activity Tolerance Patient tolerated treatment well;No increased pain   Behavior During Therapy [facility] for tasks assessed/performed      Past Medical History  Diagnosis Date  . GERD (gastroesophageal reflux disease)     Past Surgical History  Procedure Laterality Date  . Cataract extraction      Filed Vitals:   06/22/15 1012  Height: 6' (1.829 m)  Weight: 329 lb (149.233 kg)    Visit Diagnosis:  Lymphedema - Plan: Ot plan of care cert/re-cert      Subjective Assessment - 06/22/15 0927    Subjective  Pt is referred by Wenda Low, MD for Occupational Therapy evaluation and treatment of BLE lymphedema (LE), w onset reported 1.5 years ago without kown precipitating event. Pt has had no previous LE treatment and is unable to fit compression garments .   Pertinent History LE RX PRECAUTIONS:  AFIB,  Hx PE, PH, VENOUS STASIS, Hx CDIFF, CKD STGIII, Hx ACUTE RESPIRATORY FAILURE.  [place] admission 1/31/-05/23/15 for septic shock 2/2 RLE cellulitis and syncope. Re-admitted 05/27/15  with PE, increased edema, immobility and high risk for DVT., Hx major depressive disorder   Limitations difiulty walking. unable to wear street shoes, increased fall risk, difficulty w/ home management- shopping, housework, unable to drive since [id number],   Patient Stated Goals get rid of lymphedema and keep it from getting wrse   Currently in Pain? Yes   Pain  Location Leg   Pain Orientation Right;Left   Pain Descriptors / Indicators Pressure;Heaviness;Tightness;Discomfort   Pain Type Chronic pain   Pain Onset More than a month ago   Aggravating Factors  standing, walking, dependent positioning   Effect of Pain on Daily Activities limits basic and instrumental ADLs, functional mobility and ambulation, participation in life roles, leisure and social activities and community participation           [facility] OT Assessment - 06/22/15 [place] Other(Comment)  expects ultimate DC home, but states he prefers SNF/ AL plac   Available Help at Discharge [place]   Type of [place] - 4 wheels;Grab bars - toilet;Grab bars - tub/shower;Adaptive equipment;Hospital bed;Shower seat   Lives With Alone   Prior Function   Level of Independence Independent with basic ADLs;Independent with community mobility without device;Independent with homemaking with ambulation;Independent with transfers;Independent with household mobility without device   Vocation Unemployed;Other (comment)  seeking disability status   Mobility   Mobility Status Comments rolling walker   Activity Tolerance   Activity Tolerance Comments decreased standing walking tolerance- exacerbates BLE swelling and discomfort   Observation/Other Assessments   Skin Integrity Unable to fully assess 2/2 ace wraps in place bilaterally. Able to observe dry flaking skin. statsis dermatitis, redness, excessive foot and leg swelling below knees,  increased tissue density, strongly + Stemmer sign   Focus on Therapeutic Outcomes (FOTO)  ,   Outcome Measures Comparative LIMB volumetrics- TBA Rx visit 1          LYMPHEDEMA/ONCOLOGY QUESTIONNAIRE - 06/22/15 1103    What other symptoms do you have   Are you Having Heaviness or Tightness Yes   Are you having Pain Yes   Are you having pitting  edema No   Is it Hard or Difficult finding clothes that fit Yes   Do you have infections Yes   Stemmer Sign Yes   Lymphedema Stage   Stage STAGE 3 ELEPHANTIASIS   Lymphedema Assessments   Lymphedema Assessments Lower extremities                OT Treatments/Exercises (OP) - 06/22/15 0001    ADLs   ADL Education Given Yes   Manual Therapy   Manual Therapy Edema management               OT Education - 06/22/15 1028    Education provided Yes   Education Details Provided Pt/caregiver skilled education and ADL training throughout visit for lymphedema etiology, progression, and treatment including Intensive and Management Phase Complete Decongestive Therapy (CDT)  Discussed lymphedema precautions, cellulitis risk, and all CDT and LE self-care components, including compression wrapping/ garments & devices, lymphatic pumping ther ex, simple self-MLD, and skin care. Provided printed Lymphedema Workbook for reference.   Person(s) Educated Patient   Methods Explanation;Demonstration;Handout   Comprehension Verbalized understanding;Need further instruction             OT Long Term Goals - 06/22/15 1047    OT LONG TERM GOAL #1   Title Pt able to independently apply knee-length, multi layered, gradient compression wraps within 2 weeks for optimal limb volume reduction.   Baseline dependent   Time 2   Period Weeks   Status New   OT LONG TERM GOAL #2   Title Pt to achieve at least 10% BLE limb volume reductions during Intensive CDT to limit LE progression, decrease infection risk and fall risk, to reduce pain/ discomfort, and to improve ability to fit lower body clothing and street shoes.   Baseline dependent   Time 12   Period Weeks   Status New   OT LONG TERM GOAL #3   Title Pt >/= 85 % compliant with all daily, LE self-care protocols, including simple self-manual lymphatic drainage (MLD), skin care, lymphatic pumping the ex, and donning/ doffing compression wraps and  garments to limit LE progression and further functional decline.     Baseline dependent   Time 12   Period Weeks   Status New   OT LONG TERM GOAL #4   Title Pt to tolerate daily compression wraps, garments and devices in keeping w/ prescribed wear regime within 1 week of issue date to progress and retain clinical and functional gains and to limit LE progression.   Baseline dependent   Time 12   Period Weeks   Status New   OT LONG TERM GOAL #5   Title During Management Phase CDT Pt to sustain limb volume reductions achieved during Intensive Phase CDT within 5% utilizing LE self-care protocols, appropriate compression garments/ devices, and needed level of caregiver assistance.   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 Pt reports onset ~ 1.5 years ago without known precipitating event.  Pt also presenting with complex constellation of medical comorbidities, functional limitations, and decreased social support. Chronic, progressive, BLE LE limits functional independence with basic and instrumental ADLs, participation in productive and leisure activities, life role performance, functional mobility and ambulation, and participation in social and community activities. Skilled Occupational Therapy for LE care is medically necessary to reduce uncontrolled swelling and associated pain/ discomfort, to decrease infection and falls risk, to improve knowledge and performance of LE self-care, to fit with appropriate, accessible, custom, BLE compression garments and devices, and to limit further progression. Without skilled Occupational Therapy for Intensive and Management phase Complete Decongestive Therapy (CDT) this patient's condition is likely to worsen and further functional decline is expected.Training provider at care facility to assist w LE  gradient  compression wrapping would significantly improve clinical outcome and continuity of care.   Pt will benefit from skilled therapeutic intervention in order to improve on the following deficits (Retired) Decreased knowledge of use of DME;Decreased skin integrity;Increased edema;Impaired flexibility;Pain;Decreased mobility;Decreased activity tolerance;Decreased range of motion;Decreased balance;Decreased knowledge of precautions;Difficulty walking;Impaired perceived functional ability;Obesity   OT Frequency 3x / week   OT Duration 12 weeks   OT Treatment/Interventions Self-care/ADL training;DME and/or AE instruction;Manual lymph drainage;Patient/family education;Compression bandaging;Therapeutic exercises;Therapeutic activities;Other (comment);Manual Therapy  skin care   Plan Rx to LLE first. Explore charitable funds for custom compression garments.   OT Home Exercise Plan lymphatic pumping ther ex   Recommended Other Services Training provider at care facility to assist w LE gradient  compression wrapping would significantly improve clinical outcome and continuity of care.        Problem List Patient Active Problem List   Diagnosis Date Noted  . Leg edema   . Enteritis due to Clostridium difficile   . Morbid obesity ([place])   . Pulmonary hypertension ([place])   . CKD (chronic kidney disease), stage III   . Acute respiratory failure with hypoxia ([place])   . PE (pulmonary embolism) 05/27/2015  . MDD (major depressive disorder), single episode, severe , no psychosis ([place]) 05/19/2015  . Adjustment disorder with mixed anxiety and depressed mood 05/19/2015  . Cellulitis of right lower extremity   . LVH (left ventricular hypertrophy)   . Acute renal failure ([place])   . Septic shock ([place])   . Atrial fibrillation ([place])   . Ileus ([place])   . Hypokalemia   . ARF (acute renal failure) ([place])   . AKI (acute kidney injury) ([place]) 05/09/2015  . Hyperkalemia 05/09/2015  . Lactic acidosis 05/09/2015   . Hypotension 05/09/2015  . Hypothermia 05/09/2015  . Hyponatremia 05/09/2015  . Syncope 05/09/2015  . GERD (gastroesophageal reflux disease)   . Cellulitis   . Elevated troponin     Andrey Spearman, MS, OTR/L, [facility] 06/22/2015 12:06 PM   [place] MAIN [facility] SERVICES [address] [place], [place], [address] Phone: [phone]   Fax:  [phone]  Name: Scott Shelton MRN: [id number] Date of Birth: 01-Oct-1950

## 2015-06-22 NOTE — Patient Instructions (Signed)

## 2015-06-27 ENCOUNTER — Ambulatory Visit: Payer: Self-pay | Admitting: Occupational Therapy

## 2015-06-27 DIAGNOSIS — I890 Lymphedema, not elsewhere classified: Secondary | ICD-10-CM

## 2015-06-27 NOTE — Therapy (Signed)
[place] MAIN [facility] SERVICES [address] [place], [place], [address] Phone: [phone]   Fax:  [phone]  Occupational Therapy Treatment  Patient Details  Name: Scott Shelton MRN: [id number] Date of Birth: 1950/09/14 No Data Recorded  Encounter Date: 06/27/2015      OT End of Session - 06/27/15 1648    Visit Number 2   Number of Visits 36   Date for OT Re-Evaluation 09/20/15   OT Start Time 1045   OT Stop Time 1148   OT Time Calculation (min) 63 min   Equipment Utilized During Treatment LE Workbook   Activity Tolerance Patient tolerated treatment well;No increased pain   Behavior During Therapy [facility] for tasks assessed/performed      Past Medical History  Diagnosis Date  . GERD (gastroesophageal reflux disease)     Past Surgical History  Procedure Laterality Date  . Cataract extraction      There were no vitals filed for this visit.  Visit Diagnosis:  Lymphedema      Subjective Assessment - 06/27/15 1638    Subjective  Pt is referred by Wenda Low, MD for Occupational Therapy evaluation and treatment of BLE lymphedema (LE), w onset reported 1.5 years ago. Pt arrives with bilareral ace araps below the knees over guaze. Wraps are falling down to mid calf and guase is coming out of toes.   Pertinent History LE RX PRECAUTIONS:  AFIB,  Hx PE, PH, VENOUS STASIS, Hx CDIFF, CKD STGIII, Hx ACUTE RESPIRATORY FAILURE.  [place] admission 1/31/-05/23/15 for septic shock 2/2 RLE cellulitis and syncope. Re-admitted 05/27/15  with PE, increased edema, immobility and high risk for DVT., Hx major depressive disorder   Limitations difiulty walking. unable to wear street shoes, increased fall risk, difficulty w/ home management- shopping, housework, unable to drive since [id number],   Patient Stated Goals get rid of lymphedema and keep it from getting wrse   Currently in Pain? No/denies   Pain Onset More than a month ago            [facility] OT  Assessment - 06/27/15 0001    Observation/Other Assessments   Skin Integrity Unable to fully assess 2/2 ace wraps in place bilaterally. Able to observe dry flaking skin. statsis dermatitis, redness, excessive foot and leg swelling below knees, increased tissue density, strongly + Stemmer sign   Focus on Therapeutic Outcomes (FOTO)  Completed skin assessment today. 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): Skin below knees bilaterlaly presents w/ cobble-stoned, hyperkeratotic, papillomatous plaques on the shins, dorsal feet covered with a loosely adherent crust, Pt endorses occasional lymphorrhea and signs are presnt on guase,  Legs have a foul-smelling odor, most likely 2/2 a fungal infection.,         LYMPHEDEMA/ONCOLOGY QUESTIONNAIRE - 06/27/15 1643    Left Lower Extremity Lymphedema   Other LLE A-D ( below knee) limb volume measures 13,140.06 ml                 OT Treatments/Exercises (OP) - 06/27/15 0001    Manual Therapy   Manual Therapy Edema management;Compression Bandaging   Edema Management LLE comparative limb volumetrics completed today   Compression Bandaging LLE gradient compression wraps applied from toes to below knee: toe wrap x2 under cotton stockinett; 8 cm x 5 m x 1 to foot and ankle, 10 cm  x 5 m x2, 12 cm x5 cm x 2 applied circumferentially in custommary layered gradient configuration  over .04 x  10 cm x 5 m Rosidol Soft x 1.5 .rolls. Pt  Fitted w/ 3 x non-slip sock w/ top band cut to eliminate constriction at ankle.                OT Education - 06/27/15 1645    Education provided Yes   Education Details Emphasis of skilled Pt/ caregiver edu on proper gradient compression application using gradient techniques. Hand outs given.   Person(s) Educated Patient   Methods Explanation;Demonstration;Tactile cues;Verbal cues;Handout   Comprehension Verbalized understanding;Verbal cues required;Tactile cues required;Need further  instruction             OT Long Term Goals - 06/22/15 1047    OT LONG TERM GOAL #1   Title Pt able to independently apply knee-length, multi layered, gradient compression wraps within 2 weeks for optimal limb volume reduction.   Baseline dependent   Time 2   Period Weeks   Status New   OT LONG TERM GOAL #2   Title Pt to achieve at least 10% BLE limb volume reductions during Intensive CDT to limit LE progression, decrease infection risk and fall risk, to reduce pain/ discomfort, and to improve ability to fit lower body clothing and street shoes.   Baseline dependent   Time 12   Period Weeks   Status New   OT LONG TERM GOAL #3   Title Pt >/= 85 % compliant with all daily, LE self-care protocols, including simple self-manual lymphatic drainage (MLD), skin care, lymphatic pumping the ex, and donning/ doffing compression wraps and garments to limit LE progression and further functional decline.     Baseline dependent   Time 12   Period Weeks   Status New   OT LONG TERM GOAL #4   Title Pt to tolerate daily compression wraps, garments and devices in keeping w/ prescribed wear regime within 1 week of issue date to progress and retain clinical and functional gains and to limit LE progression.   Baseline dependent   Time 12   Period Weeks   Status New   OT LONG TERM GOAL #5   Title During Management Phase CDT Pt to sustain limb volume reductions achieved during Intensive Phase CDT within 5% utilizing LE self-care protocols, appropriate compression garments/ devices, and needed level of caregiver assistance.   Baseline dependent   Time 6   Period Months   Status New               Plan - 06/27/15 1702    Clinical Impression Statement After more thorough skin assessment today, changing severity of stage 3 BLE LE from moderate to severe. LLE Swelling is primarily below the knees. Limb volume comparitive data for RLE will be completed  next visit  due to time constraints today.   Recommend Pt seek dermatology consult for chemical debridement and for antifungal medication simultaneously whele undergoing LE Rx. Pt unable to reach toes during compression wrap instruction today. Caregiver assistance during visit intervals is essential for optimal clinical outcomes. Without consostenr carregiver assistance with wraps prognosis for progress towards goals is guarded.    Pt will benefit from skilled therapeutic intervention in order to improve on the following deficits (Retired) Decreased knowledge of use of DME;Decreased skin integrity;Increased edema;Impaired flexibility;Pain;Decreased mobility;Decreased activity tolerance;Decreased range of motion;Decreased balance;Decreased knowledge of precautions;Difficulty walking;Impaired perceived functional ability;Obesity   Rehab Potential Good   OT Frequency 3x / week   OT Duration 12 weeks   OT Treatment/Interventions Self-care/ADL training;DME and/or AE instruction;Manual lymph  drainage;Patient/family education;Compression bandaging;Therapeutic exercises;Therapeutic activities;Other (comment);Manual Therapy  skin care   OT Home Exercise Plan lymphatic pumping ther ex        Problem List Patient Active Problem List   Diagnosis Date Noted  . Leg edema   . Enteritis due to Clostridium difficile   . Morbid obesity ([place])   . Pulmonary hypertension ([place])   . CKD (chronic kidney disease), stage III   . Acute respiratory failure with hypoxia ([place])   . PE (pulmonary embolism) 05/27/2015  . MDD (major depressive disorder), single episode, severe , no psychosis ([place]) 05/19/2015  . Adjustment disorder with mixed anxiety and depressed mood 05/19/2015  . Cellulitis of right lower extremity   . LVH (left ventricular hypertrophy)   . Acute renal failure ([place])   . Septic shock ([place])   . Atrial fibrillation ([place])   . Ileus ([place])   . Hypokalemia   . ARF (acute renal failure) ([place])   . AKI (acute kidney injury) ([place]) 05/09/2015  .  Hyperkalemia 05/09/2015  . Lactic acidosis 05/09/2015  . Hypotension 05/09/2015  . Hypothermia 05/09/2015  . Hyponatremia 05/09/2015  . Syncope 05/09/2015  . GERD (gastroesophageal reflux disease)   . Cellulitis   . Elevated troponin     Andrey Spearman, MS, OTR/L, [facility] 06/27/2015 5:08 PM  [place] MAIN [facility] SERVICES [address] [place], [place], [address] Phone: [phone]   Fax:  [phone]  Name: Royall Colton MRN: [id number] Date of Birth: 1950-05-28

## 2015-06-27 NOTE — Patient Instructions (Signed)

## 2015-06-29 ENCOUNTER — Ambulatory Visit: Payer: Self-pay | Admitting: Occupational Therapy

## 2015-06-29 DIAGNOSIS — I890 Lymphedema, not elsewhere classified: Secondary | ICD-10-CM

## 2015-06-29 NOTE — Patient Instructions (Signed)
LE instructions and precautions as established- see initial eval.   

## 2015-06-29 NOTE — Therapy (Signed)
[place] MAIN [facility] SERVICES [address] [place], [place], [address] Phone: [phone]   Fax:  [phone]  Occupational Therapy Treatment  Patient Details  Name: Scott Shelton MRN: [id number] Date of Birth: 29-May-1950 No Data Recorded  Encounter Date: 06/29/2015      OT End of Session - 06/29/15 1215    Visit Number 3   Number of Visits 36   Date for OT Re-Evaluation 09/20/15   OT Start Time 1049   OT Stop Time 1215   OT Time Calculation (min) 86 min   Equipment Utilized During Treatment LE Workbook   Activity Tolerance Patient tolerated treatment well;No increased pain   Behavior During Therapy [facility] for tasks assessed/performed      Past Medical History  Diagnosis Date  . GERD (gastroesophageal reflux disease)     Past Surgical History  Procedure Laterality Date  . Cataract extraction      There were no vitals filed for this visit.  Visit Diagnosis:  Lymphedema      Subjective Assessment - 06/29/15 1054    Subjective  Pt presents for OT visit 3 for BLE lymphedema (LE) care. Pt presents with LLE compression wraps applied last visit in place. Pt is unaccompanied by care staff, as requested on last visit note to ensure continuity of  care.   Pertinent History LE RX PRECAUTIONS:  AFIB,  Hx PE, PH, VENOUS STASIS, Hx CDIFF, CKD STGIII, Hx ACUTE RESPIRATORY FAILURE.  [place] admission 1/31/-05/23/15 for septic shock 2/2 RLE cellulitis and syncope. Re-admitted 05/27/15  with PE, increased edema, immobility and high risk for DVT., Hx major depressive disorder   Limitations difiulty walking. unable to wear street shoes, increased fall risk, difficulty w/ home management- shopping, housework, unable to drive since [id number],   Patient Stated Goals get rid of lymphedema and keep it from getting wrse   Currently in Pain? No/denies   Pain Onset More than a month ago                      OT Treatments/Exercises (OP) - 06/29/15 0001     ADLs   ADL Education Given Yes   Manual Therapy   Manual Therapy Edema management;Compression Bandaging   Compression Bandaging LLE gradient compression wraps applied from toes to below knee: toe wrap x2 under cotton stockinett; 8 cm x 5 m x 1 to foot and ankle, 10 cm  x 5 m x2, 12 cm x5 cm x 2 applied circumferentially in custommary layered gradient configuration  over .04 x 10 cm x 5 m Rosidol Soft x 1.5 .rolls. Pt  Fitted w/ 3 x non-slip sock w/ top band cut to eliminate constriction at ankle.                OT Education - 06/29/15 1058    Education provided Yes   Education Details Emphasis today on proper gradient compression application using proper technique, and on BLE ymphatic pumping there ex including seated, standing and supine variations. All hand outs given.Good return for there ex.   Person(s) Educated Patient   Methods Explanation;Demonstration;Tactile cues;Verbal cues;Handout   Comprehension Verbalized understanding;Returned demonstration;Verbal cues required;Tactile cues required;Need further instruction             OT Long Term Goals - 06/22/15 1047    OT LONG TERM GOAL #1   Title Pt able to independently apply knee-length, multi layered, gradient compression wraps within 2 weeks for optimal limb volume reduction.  Baseline dependent   Time 2   Period Weeks   Status New   OT LONG TERM GOAL #2   Title Pt to achieve at least 10% BLE limb volume reductions during Intensive CDT to limit LE progression, decrease infection risk and fall risk, to reduce pain/ discomfort, and to improve ability to fit lower body clothing and street shoes.   Baseline dependent   Time 12   Period Weeks   Status New   OT LONG TERM GOAL #3   Title Pt >/= 85 % compliant with all daily, LE self-care protocols, including simple self-manual lymphatic drainage (MLD), skin care, lymphatic pumping the ex, and donning/ doffing compression wraps and garments to limit LE progression and  further functional decline.     Baseline dependent   Time 12   Period Weeks   Status New   OT LONG TERM GOAL #4   Title Pt to tolerate daily compression wraps, garments and devices in keeping w/ prescribed wear regime within 1 week of issue date to progress and retain clinical and functional gains and to limit LE progression.   Baseline dependent   Time 12   Period Weeks   Status New   OT LONG TERM GOAL #5   Title During Management Phase CDT Pt to sustain limb volume reductions achieved during Intensive Phase CDT within 5% utilizing LE self-care protocols, appropriate compression garments/ devices, and needed level of caregiver assistance.   Baseline dependent   Time 6   Period Months   Status New               Plan - 06/29/15 1220    Pt will benefit from skilled therapeutic intervention in order to improve on the following deficits (Retired) Decreased knowledge of use of DME;Decreased skin integrity;Increased edema;Impaired flexibility;Pain;Decreased mobility;Decreased activity tolerance;Decreased range of motion;Decreased balance;Decreased knowledge of precautions;Difficulty walking;Impaired perceived functional ability;Obesity   Rehab Potential Good   OT Frequency 3x / week   OT Duration 12 weeks   OT Treatment/Interventions Self-care/ADL training;DME and/or AE instruction;Manual lymph drainage;Patient/family education;Compression bandaging;Therapeutic exercises;Therapeutic activities;Other (comment);Manual Therapy  skin care   OT Home Exercise Plan lymphatic pumping ther ex        Problem List Patient Active Problem List   Diagnosis Date Noted  . Leg edema   . Enteritis due to Clostridium difficile   . Morbid obesity ([place])   . Pulmonary hypertension ([place])   . CKD (chronic kidney disease), stage III   . Acute respiratory failure with hypoxia ([place])   . PE (pulmonary embolism) 05/27/2015  . MDD (major depressive disorder), single episode, severe , no psychosis ([place])  05/19/2015  . Adjustment disorder with mixed anxiety and depressed mood 05/19/2015  . Cellulitis of right lower extremity   . LVH (left ventricular hypertrophy)   . Acute renal failure ([place])   . Septic shock ([place])   . Atrial fibrillation ([place])   . Ileus ([place])   . Hypokalemia   . ARF (acute renal failure) ([place])   . AKI (acute kidney injury) ([place]) 05/09/2015  . Hyperkalemia 05/09/2015  . Lactic acidosis 05/09/2015  . Hypotension 05/09/2015  . Hypothermia 05/09/2015  . Hyponatremia 05/09/2015  . Syncope 05/09/2015  . GERD (gastroesophageal reflux disease)   . Cellulitis   . Elevated troponin    Andrey Spearman, MS, OTR/L, [facility] 06/29/2015 12:29 PM  [place] MAIN [facility] SERVICES [address] [place], [place], [address] Phone: [phone]   Fax:  [phone]  Name: Kyell Turowski MRN: [id number] Date of Birth: [birth year]

## 2015-07-04 ENCOUNTER — Other Ambulatory Visit: Payer: Self-pay

## 2015-07-05 ENCOUNTER — Ambulatory Visit: Payer: Self-pay | Admitting: Occupational Therapy

## 2015-07-05 DIAGNOSIS — I890 Lymphedema, not elsewhere classified: Secondary | ICD-10-CM

## 2015-07-05 NOTE — Patient Instructions (Signed)

## 2015-07-05 NOTE — Therapy (Signed)
[place] MAIN [facility] SERVICES [address] [place], [place], [address] Phone: [phone]   Fax:  [phone]  Occupational Therapy Treatment  Patient Details  Name: Scott Shelton MRN: [id number] Date of Birth: Jun 15, 1950 No Data Recorded  Encounter Date: 07/05/2015      OT End of Session - 07/05/15 1030    Visit Number 4   Number of Visits 36   Date for OT Re-Evaluation 09/20/15   OT Start Time 1010   OT Stop Time 1125   OT Time Calculation (min) 75 min   Equipment Utilized During Treatment LE Workbook   Activity Tolerance Patient tolerated treatment well;No increased pain   Behavior During Therapy [facility] for tasks assessed/performed      Past Medical History  Diagnosis Date  . GERD (gastroesophageal reflux disease)     Past Surgical History  Procedure Laterality Date  . Cataract extraction      There were no vitals filed for this visit.  Visit Diagnosis:  Lymphedema      Subjective Assessment - 07/05/15 1025    Subjective  Pt presents for OT  visit 4 for CDT to BLE. Pt reports he saw dermatologist, Dr. Allyson Sabal, yesterday and he received topicals for debridement. Pt is unaccompanied to appointment.   Pertinent History LE RX PRECAUTIONS:  AFIB,  Hx PE, PH, VENOUS STASIS, Hx CDIFF, CKD STGIII, Hx ACUTE RESPIRATORY FAILURE.  [place] admission 1/31/-05/23/15 for septic shock 2/2 RLE cellulitis and syncope. Re-admitted 05/27/15  with PE, increased edema, immobility and high risk for DVT., Hx major depressive disorder   Limitations difiulty walking. unable to wear street shoes, increased fall risk, difficulty w/ home management- shopping, housework, unable to drive since [id number],   Patient Stated Goals get rid of lymphedema and keep it from getting wrse   Currently in Pain? No/denies   Pain Onset More than a month ago                      OT Treatments/Exercises (OP) - 07/05/15 0001    ADLs   ADL Education Given Yes   Manual  Therapy   Manual Therapy Edema management;Compression Bandaging   Edema Management LLE comparative limb volumetrics completed today   Compression Bandaging LLE gradient compression wraps applied from toes to below knee: toe wrap x2 under cotton stockinett; 8 cm x 5 m x 1 to foot and ankle, 10 cm  x 5 m x2, 12 cm x5 cm x 2 applied circumferentially in custommary layered gradient configuration  over .04 x 10 cm x 5 m Rosidol Soft x 1.5 .rolls. Pt  Fitted w/ 3 x non-slip sock w/ top band cut to eliminate constriction at ankle.                OT Education - 07/05/15 1026    Education provided Yes   Education Details Emphasis today on proper gradient compression application using proper technique, and on BLE ymphatic pumping there ex including seated, standing and supine variations. All hand outs given.Good return for there ex.   Person(s) Educated Patient   Methods Explanation;Demonstration;Tactile cues;Verbal cues;Handout   Comprehension Verbalized understanding;Returned demonstration;Verbal cues required;Tactile cues required;Need further instruction             OT Long Term Goals - 06/22/15 1047    OT LONG TERM GOAL #1   Title Pt able to independently apply knee-length, multi layered, gradient compression wraps within 2 weeks for optimal limb volume reduction.  Baseline dependent   Time 2   Period Weeks   Status New   OT LONG TERM GOAL #2   Title Pt to achieve at least 10% BLE limb volume reductions during Intensive CDT to limit LE progression, decrease infection risk and fall risk, to reduce pain/ discomfort, and to improve ability to fit lower body clothing and street shoes.   Baseline dependent   Time 12   Period Weeks   Status New   OT LONG TERM GOAL #3   Title Pt >/= 85 % compliant with all daily, LE self-care protocols, including simple self-manual lymphatic drainage (MLD), skin care, lymphatic pumping the ex, and donning/ doffing compression wraps and garments to  limit LE progression and further functional decline.     Baseline dependent   Time 12   Period Weeks   Status New   OT LONG TERM GOAL #4   Title Pt to tolerate daily compression wraps, garments and devices in keeping w/ prescribed wear regime within 1 week of issue date to progress and retain clinical and functional gains and to limit LE progression.   Baseline dependent   Time 12   Period Weeks   Status New   OT LONG TERM GOAL #5   Title During Management Phase CDT Pt to sustain limb volume reductions achieved during Intensive Phase CDT within 5% utilizing LE self-care protocols, appropriate compression garments/ devices, and needed level of caregiver assistance.   Baseline dependent   Time 6   Period Months   Status New               Plan - 07/05/15 1048    Clinical Impression Statement By end of session Pt able to appy compression wraps to foot with mod assistance. Pt instructed to wear wraps no more than 24 hours withoutr removing to inspect skin, bathe leg, apply topical ointments and lotions and re-wrap.   Pt will benefit from skilled therapeutic intervention in order to improve on the following deficits (Retired) Decreased knowledge of use of DME;Decreased skin integrity;Increased edema;Impaired flexibility;Pain;Decreased mobility;Decreased activity tolerance;Decreased range of motion;Decreased balance;Decreased knowledge of precautions;Difficulty walking;Impaired perceived functional ability;Obesity   Rehab Potential Good   OT Frequency 3x / week   OT Duration 12 weeks   OT Treatment/Interventions Self-care/ADL training;DME and/or AE instruction;Manual lymph drainage;Patient/family education;Compression bandaging;Therapeutic exercises;Therapeutic activities;Other (comment);Manual Therapy  skin care   OT Home Exercise Plan lymphatic pumping ther ex        Problem List Patient Active Problem List   Diagnosis Date Noted  . Leg edema   . Enteritis due to Clostridium  difficile   . Morbid obesity ([place])   . Pulmonary hypertension ([place])   . CKD (chronic kidney disease), stage III   . Acute respiratory failure with hypoxia ([place])   . PE (pulmonary embolism) 05/27/2015  . MDD (major depressive disorder), single episode, severe , no psychosis ([place]) 05/19/2015  . Adjustment disorder with mixed anxiety and depressed mood 05/19/2015  . Cellulitis of right lower extremity   . LVH (left ventricular hypertrophy)   . Acute renal failure ([place])   . Septic shock ([place])   . Atrial fibrillation ([place])   . Ileus ([place])   . Hypokalemia   . ARF (acute renal failure) ([place])   . AKI (acute kidney injury) ([place]) 05/09/2015  . Hyperkalemia 05/09/2015  . Lactic acidosis 05/09/2015  . Hypotension 05/09/2015  . Hypothermia 05/09/2015  . Hyponatremia 05/09/2015  . Syncope 05/09/2015  . GERD (gastroesophageal reflux disease)   .  Cellulitis   . Elevated troponin     Andrey Spearman, MS, OTR/L, [facility] 07/05/2015 11:25 AM  [place] MAIN [facility] SERVICES [address] [place], [place], [address] Phone: [phone]   Fax:  [phone]  Name: Scott Shelton MRN: [id number] Date of Birth: 19-Jul-1950

## 2015-07-06 ENCOUNTER — Ambulatory Visit: Payer: Self-pay | Admitting: Cardiovascular Disease

## 2015-07-07 ENCOUNTER — Encounter: Payer: Self-pay | Admitting: Cardiovascular Disease

## 2015-07-07 ENCOUNTER — Ambulatory Visit (INDEPENDENT_AMBULATORY_CARE_PROVIDER_SITE_OTHER): Payer: Self-pay | Admitting: Cardiovascular Disease

## 2015-07-07 VITALS — BP 150/72 | HR 69 | Ht 72.0 in | Wt 332.0 lb

## 2015-07-07 DIAGNOSIS — I272 Other secondary pulmonary hypertension: Secondary | ICD-10-CM

## 2015-07-07 DIAGNOSIS — R0902 Hypoxemia: Secondary | ICD-10-CM

## 2015-07-07 DIAGNOSIS — Z79899 Other long term (current) drug therapy: Secondary | ICD-10-CM

## 2015-07-07 DIAGNOSIS — I4891 Unspecified atrial fibrillation: Secondary | ICD-10-CM

## 2015-07-07 DIAGNOSIS — R0683 Snoring: Secondary | ICD-10-CM

## 2015-07-07 NOTE — Patient Instructions (Signed)
Medication Instructions:  Your physician recommends that you continue on your current medications as directed. Please refer to the Current Medication list given to you today.  Labwork: CMET at [facility] lab on the first floor  Testing/Procedures: Your physician has recommended that you have a pulmonary function test. Pulmonary Function Tests are a group of tests that measure how well air moves in and out of your lungs.  Your physician has recommended that you have a sleep study. This test records several body functions during sleep, including: brain activity, eye movement, oxygen and carbon dioxide blood levels, heart rate and rhythm, breathing rate and rhythm, the flow of air through your mouth and nose, snoring, body muscle movements, and chest and belly movement.  Follow-Up: Your physician recommends that you schedule a follow-up appointment in: 3 month ov  If you need a refill on your cardiac medications before your next appointment, please call your pharmacy.

## 2015-07-07 NOTE — Progress Notes (Signed)
Cardiology Office Note   Date:  07/07/2015   ID:  Scott Shelton, DOB April 23, 1950, MRN [id number]  PCP:  No primary care provider on file.  Cardiologist:   Scott Harness, MD   Chief Complaint  Patient presents with  . Follow-up    post ED  pt c/o swelling in legs/feet/ankles--lymphedema      History of Present Illness: Scott Shelton is a 64 y.o. male with GERD who presents for follow-up after hospitalization.  Scott Shelton was hospitalized February 18 to the 24th with atrial fibrillation.  He developed septic shock likely due to lower external any cellulitis. He then had an episode of syncope and was brought to the emergency department via EMS. He was found to have bilateral pulmonary emboli.  During that hospitalization he had intermittent episodes of atrial fibrillation. He was anticoagulated with warfarin.  Echo revealed normal systolic function with a PA systolic pressure of 57 mmHg.  Scott Shelton has been doing well since discharge.  He denies any chest pain or shortness of breath.  He has been at [facility] for rehab and has elected to stay there because they transport him for lymphedema treatments on his legs at [facility].  He has a long-standing history of lymphedema and it much better controlled now.  He gets his blood pressure checked regularly there and it has been well-controlled.  His primary care provider, Dr. Lorenda Hatchet, manages his coumadin.  Scott Shelton has been walking daily for 30-45 minutes.  He denies chest pain or shortness of breath with this activity.  Although he has lower extremity edema, he denies orthopnea or PND.  Past Medical History  Diagnosis Date  . GERD (gastroesophageal reflux disease)     Past Surgical History  Procedure Laterality Date  . Cataract extraction       Current Outpatient Prescriptions  Medication Sig Dispense Refill  . amiodarone (PACERONE) 200 MG tablet Take 1 tablet (200 mg total) by mouth daily. 90 tablet 1  .  FLUoxetine (PROZAC) 20 MG capsule Take 1 capsule (20 mg total) by mouth daily. 90 capsule 1  . furosemide (LASIX) 20 MG tablet Take 3 tablets (60 mg total) by mouth daily. 30 tablet 0  . hydrocerin (EUCERIN) CREA Apply 1 application topically daily. 454 g 1  . potassium chloride (K-DUR) 10 MEQ tablet Take 2 tablets (20 mEq total) by mouth daily. 30 tablet 0  . traZODone (DESYREL) 50 MG tablet Take 1 tablet (50 mg total) by mouth at bedtime. 30 tablet 1  . warfarin (COUMADIN) 5 MG tablet Take 1 tablet (5 mg total) by mouth daily at 6 PM. Start from Saturday 2/25 30 tablet 0   No current facility-administered medications for this visit.    Allergies:   Review of patient's allergies indicates no known allergies.    Social History:  The patient  reports that he has never smoked. He does not have any smokeless tobacco history on file. He reports that he does not drink alcohol or use illicit drugs.   Family History:  The patient's family history is not on file.    ROS:  Please see the history of present illness.   Otherwise, review of systems are positive for none.   All other systems are reviewed and negative.    PHYSICAL EXAM: VS:  BP 150/72 mmHg  Pulse 69  Ht 6' (1.829 m)  Wt 150.594 kg (332 lb)  BMI 45.02 kg/m2 , BMI Body mass index is 45.02 kg/(m^2). GENERAL:  Well appearing HEENT:  Pupils equal round and reactive, fundi not visualized, oral mucosa unremarkable NECK:  No jugular venous distention, waveform within normal limits, carotid upstroke brisk and symmetric, no bruits, no thyromegaly LYMPHATICS:  No cervical adenopathy LUNGS:  Clear to auscultation bilaterally HEART:  RRR.  PMI not displaced or sustained,S1 and S2 within normal limits, no S3, no S4, no clicks, no rubs, no murmurs ABD:  Flat, positive bowel sounds normal in frequency in pitch, no bruits, no rebound, no guarding, no midline pulsatile mass, no hepatomegaly, no splenomegaly EXT:  2 plus pulses throughout, no  cyanosis no clubbing.  Bilateral lymphedema with compression wrapping.  SKIN:  No rashes no nodules NEURO:  Cranial nerves II through XII grossly intact, motor grossly intact throughout PSYCH:  Cognitively intact, oriented to person place and time    EKG:  EKG is not ordered today.  Echo 05/29/15: Study Conclusions  - Left ventricle: The cavity size was normal. There was mild  concentric hypertrophy. Systolic function was normal. The  estimated ejection fraction was in the range of 55% to 60%. Wall  motion was normal; there were no regional wall motion  abnormalities. - Aortic valve: Trileaflet; mildly thickened, mildly calcified  leaflets. - Mitral valve: Calcified annulus. There was mild regurgitation. - Right ventricle: Systolic function was mildly reduced. - Pulmonic valve: There was trivial regurgitation. - Pulmonary arteries: PA peak pressure: 57 mm Hg (S).  Impressions:  - The right ventricular systolic pressure was increased consistent  with moderate pulmonary hypertension.   Recent Labs: 05/12/2015: TSH 1.293 05/27/2015: ALT 27; B Natriuretic Peptide 140.4* 05/30/2015: Magnesium 2.2 06/01/2015: Hemoglobin 8.9*; Platelets 193 06/02/2015: BUN 10; Creatinine, Ser 1.30*; Potassium 3.1*; Sodium 140    Lipid Panel    Component Value Date/Time   CHOL 121 05/09/2015 1450   TRIG 142 05/09/2015 1450   HDL 25* 05/09/2015 1450   CHOLHDL 4.8 05/09/2015 1450   VLDL 28 05/09/2015 1450   LDLCALC 68 05/09/2015 1450      Wt Readings from Last 3 Encounters:  07/07/15 150.594 kg (332 lb)  06/22/15 149.233 kg (329 lb)  06/02/15 148.6 kg (327 lb 9.7 oz)     ASSESSMENT AND PLAN:  # Atrial fibrillation: Scott Shelton does not appear to be in atrial fibrillation at this time.  It is possible that the atrial fibrillation was precipiated by the pulmonary embolism.  He was started on amiodarone because his blood pressure at the time could not tolerate nodal agents.  We will  continue amiodarone for now with plans to try and stop it next appointment.  We will refer him for baseline PFTs given that he is on the amiodarone for now.  We will also refer him for a sleep study because he snores and was noted to have overnight hypoxia in the hospital.  Untreaded OSA could also be causing both the atrial fibrillation and elevated pulmonary pressures. Continue warfarin for anticoagulation.    # Elevated blood pressure: Scott Shelton blood pressure is above goal today.  He reports that it has been well-controlled.  I have asked his rehab to check his BP at least twice per week.  If it is >140/90, we will start carvedilol 6.25mg  bid .  # Pulmonary hypertension: Scott Shelton pulmonary pressures were moderate to severely elevated on echo.  This was likely due to his acute PE, but there is concern for OSA as well.  We will get a sleep study.  We will also repeat his echo  in 3 months to see if the pressures have improved after treatment of his PE and possibly OSA.  Current medicines are reviewed at length with the patient today.  The patient does not have concerns regarding medicines.  The following changes have been made:  no change  Labs/ tests ordered today include:  No orders of the defined types were placed in this encounter.     Disposition:   FU with Scott Squitieri C. Oval Linsey, MD, [facility] in 3 months.   This note was written with the assistance of speech recognition software.  Please excuse any transcriptional errors.  Signed, Scott Parmer C. Oval Linsey, MD, [facility]  07/07/2015 11:48 AM    [place]

## 2015-07-10 ENCOUNTER — Ambulatory Visit: Payer: Self-pay | Attending: Internal Medicine | Admitting: Occupational Therapy

## 2015-07-10 ENCOUNTER — Encounter: Payer: Self-pay | Admitting: Cardiovascular Disease

## 2015-07-10 DIAGNOSIS — I890 Lymphedema, not elsewhere classified: Secondary | ICD-10-CM | POA: Insufficient documentation

## 2015-07-10 NOTE — Patient Instructions (Addendum)
Left distal lateral leg wound needs appropriate dressing once Pt arrives at residential facility to limit infection risk and to contain lymphorrhea.  Pt needs referral to wound clinic ASAP.  Lymphedema Precautions  If you experience atypical shortness of breath, or notice any signs /symptoms of skin infection (aka cellulitis) remove all compression wraps/ garments, discontinue manual lymphatic drainage (MLD), and report symptoms to your physician immediately. Discontinue MLD and compression for 72 hours after you take your first oral antibiotic so not to spread the infection.   Lymphedema Self- Care Instructions  1. EXERCISE: Perform lymphatic pumping there ex 2 x a day. While wearing your compression wraps or garments. Perform 10 reps of each exercise bilaterally and be sure to perform them in order. Don;t skip around!  OMIT PARTIAL SIT UP  2. MLD: Perform simple self-Manual Lymphatic Drainage (MLD) at least once a day as directed.  3. WRAPS: Compression wraps are to be worn 23 hrs/ 7 days/wk during Intensive Phase of Complete Decongestive Therapy (CDT).Building tolerance may take time and practice, so don't get discouraged. If bandages begin to feel tight during periods of inactivity and/or during the night, try performing your exercises to loosen them.   4. GARMENTS: During Management Phase CDT your compression garments are to be worn during waking hours when active. Do NOT sleep in your garments!!   5. PUT YOUR FEET UP! Elevate your feet and legs and feet to the level of your heart whenever you are sitting down.   6. SKIN: Carefully monitor skin condition and perform impeccable hygiene daily. Bathe skin with mild soap and water and apply low pH lotion (aka Eucerin ) to improve hydration and limit infection risk.    Lymphatic Pumping Exercises:

## 2015-07-10 NOTE — Therapy (Signed)
[place] MAIN [facility] SERVICES [address] [place], [place], [address] Phone: [phone]   Fax:  [phone]  Occupational Therapy Treatment & Discharge Summary Patient Details  Name: Scott Shelton MRN: [id number] Date of Birth: 16-Aug-1950 No Data Recorded  Encounter Date: 07/10/2015      OT End of Session - 07/10/15 1622    Visit Number 5   Number of Visits 36   Date for OT Re-Evaluation 09/20/15   OT Start Time 0322   OT Stop Time 0430   OT Time Calculation (min) 68 min   Equipment Utilized During Treatment LE Workbook   Activity Tolerance Patient tolerated treatment well;No increased pain   Behavior During Therapy [facility] for tasks assessed/performed      Past Medical History  Diagnosis Date  . GERD (gastroesophageal reflux disease)     Past Surgical History  Procedure Laterality Date  . Cataract extraction      There were no vitals filed for this visit.  Visit Diagnosis:  Lymphedema - Plan: Ot plan of care cert/re-cert      Subjective Assessment - 07/10/15 1602    Subjective  Pt presents for OT  isit 5 for CDT to BLE. Pt reports staff did not assist w/ compression wrap changes. Wraps were not removed or reapplied as recommended. Skin was not inspected, bathed, or treated with perscription Urea cream.     Pertinent History LE RX PRECAUTIONS:  AFIB,  Hx PE, PH, VENOUS STASIS, Hx CDIFF, CKD STGIII, Hx ACUTE RESPIRATORY FAILURE.  [place] admission 1/31/-05/23/15 for septic shock 2/2 RLE cellulitis and syncope. Re-admitted 05/27/15  with PE, increased edema, immobility and high risk for DVT., Hx major depressive disorder   Limitations difiulty walking. unable to wear street shoes, increased fall risk, difficulty w/ home management- shopping, housework, unable to drive since 5/63,   Patient Stated Goals get rid of lymphedema and keep it from getting wrse   Currently in Pain? No/denies   Pain Onset More than a month ago                       OT Treatments/Exercises (OP) - 07/10/15 0001    ADLs   ADL Education Given Yes   Manual Therapy   Manual Therapy Edema management   Manual therapy comments attempted skin care but unable 2/2 nre open wound on distal lateral aspect of L leg.  Applied curlex with wide 2 layer ace wrap temporarily to get patient to his residence. We are not equipped at the lymphedema clinic to provide wound care or to apply needed dressings..    Compression Bandaging --                OT Education - 07/10/15 1620    Education provided No             OT Long Term Goals - 07/10/15 1619    OT LONG TERM GOAL #1   Title Pt able to independently apply knee-length, multi layered, gradient compression wraps within 2 weeks for optimal limb volume reduction.   Baseline dependent   Time 2   Period Weeks   Status Partially Met   OT LONG TERM GOAL #2   Title Pt to achieve at least 10% BLE limb volume reductions during Intensive CDT to limit LE progression, decrease infection risk and fall risk, to reduce pain/ discomfort, and to improve ability to fit lower body clothing and street shoes.   Baseline dependent  Time 12   Period Weeks   Status Partially Met   OT LONG TERM GOAL #3   Title Pt >/= 85 % compliant with all daily, LE self-care protocols, including simple self-manual lymphatic drainage (MLD), skin care, lymphatic pumping the ex, and donning/ doffing compression wraps and garments to limit LE progression and further functional decline.     Baseline dependent   Time 12   Period Weeks   Status Not Met   OT LONG TERM GOAL #4   Title Pt to tolerate daily compression wraps, garments and devices in keeping w/ prescribed wear regime within 1 week of issue date to progress and retain clinical and functional gains and to limit LE progression.   Baseline dependent   Time 12   Period Weeks   Status Partially Met   OT LONG TERM GOAL #5   Title During Management  Phase CDT Pt to sustain limb volume reductions achieved during Intensive Phase CDT within 5% utilizing LE self-care protocols, appropriate compression garments/ devices, and needed level of caregiver assistance.   Baseline dependent   Time 6   Period Months   Status On-going               Plan - 07/10/15 1612    Clinical Impression Statement Pt arrives w/ LLE compression wraps in place. Compression wraps were not removed since last session on wednesday 07/05/15. Skin was not inspected, bathed or medicated with perscription ointment as directed by Dr Allyson Sabal. Upon removing wraps open  wound is observed with  bleeding and moderate lymphorrhea. Director of Nursing, Haynes Hoehn, at [facility] and [facility] agreed with OT's rrequest for  referral to wound clinic. ASAP. Pt is discharged from Occupation Therapy for Lymphedema Care . He remains an excellent candidate for LE treatment and I hope we will resume when woun  is healed and he is released from [facility] care. Without significant support from staff at residential facility prognosis for significant physical and function improvement is guarded. Pt will need new referral to resome OT for CDT to BLE.   Pt will benefit from skilled therapeutic intervention in order to improve on the following deficits (Retired) Decreased knowledge of use of DME;Decreased skin integrity;Increased edema;Impaired flexibility;Pain;Decreased mobility;Decreased activity tolerance;Decreased range of motion;Decreased balance;Decreased knowledge of precautions;Difficulty walking;Impaired perceived functional ability;Obesity   Rehab Potential Good   OT Frequency 3x / week   OT Duration 12 weeks   OT Treatment/Interventions Self-care/ADL training;DME and/or AE instruction;Manual lymph drainage;Patient/family education;Compression bandaging;Therapeutic exercises;Therapeutic activities;Other (comment);Manual Therapy  skin care   OT Home Exercise Plan lymphatic pumping ther ex         Problem List Patient Active Problem List   Diagnosis Date Noted  . Leg edema   . Enteritis due to Clostridium difficile   . Morbid obesity ([place])   . Pulmonary hypertension ([place])   . CKD (chronic kidney disease), stage III   . Acute respiratory failure with hypoxia ([place])   . PE (pulmonary embolism) 05/27/2015  . MDD (major depressive disorder), single episode, severe , no psychosis ([place]) 05/19/2015  . Adjustment disorder with mixed anxiety and depressed mood 05/19/2015  . Cellulitis of right lower extremity   . LVH (left ventricular hypertrophy)   . Acute renal failure ([place])   . Septic shock ([place])   . Atrial fibrillation ([place])   . Ileus ([place])   . Hypokalemia   . ARF (acute renal failure) ([place])   . AKI (acute kidney injury) ([place]) 05/09/2015  . Hyperkalemia  05/09/2015  . Lactic acidosis 05/09/2015  . Hypotension 05/09/2015  . Hypothermia 05/09/2015  . Hyponatremia 05/09/2015  . Syncope 05/09/2015  . GERD (gastroesophageal reflux disease)   . Cellulitis   . Elevated troponin     Andrey Spearman, MS, OTR/L, [facility] 07/10/2015 4:29 PM  [place] MAIN [facility] SERVICES [address] [place], [place], [address] Phone: [phone]   Fax:  [phone]  Name: Scott Shelton MRN: [id number] Date of Birth: 1950/06/22

## 2015-07-11 ENCOUNTER — Telehealth: Payer: Self-pay | Admitting: Cardiovascular Disease

## 2015-07-11 NOTE — Telephone Encounter (Signed)
BMP 07/08/15  Na 142, K 4.2, BUN 14.4, creatinine 1.26 AST 12, ALT 9

## 2015-07-12 ENCOUNTER — Ambulatory Visit: Payer: Self-pay | Admitting: Occupational Therapy

## 2015-07-13 ENCOUNTER — Telehealth: Payer: Self-pay | Admitting: *Deleted

## 2015-07-13 ENCOUNTER — Ambulatory Visit (HOSPITAL_COMMUNITY)
Admission: RE | Admit: 2015-07-13 | Discharge: 2015-07-13 | Disposition: A | Discharge status: Home / Self Care | Payer: Self-pay | Source: Ambulatory Visit | Attending: Cardiovascular Disease | Admitting: Cardiovascular Disease

## 2015-07-13 ENCOUNTER — Ambulatory Visit: Payer: Self-pay | Admitting: Cardiovascular Disease

## 2015-07-13 DIAGNOSIS — I4891 Unspecified atrial fibrillation: Secondary | ICD-10-CM | POA: Insufficient documentation

## 2015-07-13 DIAGNOSIS — R0902 Hypoxemia: Secondary | ICD-10-CM | POA: Insufficient documentation

## 2015-07-13 LAB — PULMONARY FUNCTION TEST
DL/VA % pred: 93 %
DL/VA: 4.42 ml/min/mmHg/L
DLCO unc % pred: 73 %
DLCO unc: 25.18 ml/min/mmHg
FEF 25-75 PRE: 2.02 L/s
FEF 25-75 Post: 3.59 L/sec
FEF2575-%CHANGE-POST: 77 %
FEF2575-%PRED-PRE: 68 %
FEF2575-%Pred-Post: 121 %
FEV1-%Change-Post: 16 %
FEV1-%PRED-POST: 78 %
FEV1-%PRED-PRE: 67 %
FEV1-POST: 2.91 L
FEV1-PRE: 2.49 L
FEV1FVC-%Change-Post: 6 %
FEV1FVC-%Pred-Pre: 101 %
FEV6-%CHANGE-POST: 10 %
FEV6-%PRED-PRE: 68 %
FEV6-%Pred-Post: 76 %
FEV6-POST: 3.56 L
FEV6-PRE: 3.23 L
FEV6FVC-%Change-Post: 0 %
FEV6FVC-%PRED-POST: 105 %
FEV6FVC-%PRED-PRE: 104 %
FVC-%CHANGE-POST: 9 %
FVC-%Pred-Post: 72 %
FVC-%Pred-Pre: 66 %
FVC-POST: 3.57 L
FVC-Pre: 3.26 L
POST FEV6/FVC RATIO: 100 %
PRE FEV1/FVC RATIO: 76 %
Post FEV1/FVC ratio: 81 %
Pre FEV6/FVC Ratio: 99 %
RV % PRED: 116 %
RV: 2.79 L
TLC % PRED: 89 %
TLC: 6.51 L

## 2015-07-13 MED ORDER — ALBUTEROL SULFATE (2.5 MG/3ML) 0.083% IN NEBU
2.5000 mg | INHALATION_SOLUTION | Freq: Once | RESPIRATORY_TRACT | Status: AC
  Administered 2015-07-13: 2.5 mg via RESPIRATORY_TRACT

## 2015-07-13 NOTE — Telephone Encounter (Signed)
Advised patient

## 2015-07-13 NOTE — Telephone Encounter (Signed)
-----   Message from Skeet Latch, MD sent at 07/11/2015  8:06 AM EDT ----- Labs reviewed.  Kidney function is improving and electrolytes are stable.  Continue current medications.

## 2015-07-14 ENCOUNTER — Ambulatory Visit: Payer: Self-pay | Admitting: Occupational Therapy

## 2015-07-17 ENCOUNTER — Ambulatory Visit: Payer: Self-pay | Admitting: Occupational Therapy

## 2015-07-17 ENCOUNTER — Telehealth: Payer: Self-pay | Admitting: Cardiovascular Disease

## 2015-07-17 DIAGNOSIS — J439 Emphysema, unspecified: Secondary | ICD-10-CM

## 2015-07-17 DIAGNOSIS — R942 Abnormal results of pulmonary function studies: Secondary | ICD-10-CM

## 2015-07-17 NOTE — Telephone Encounter (Signed)
New message   Pt calling to speak to Rn he wants to know if the 4/25  Post hospital follow up is needed  Pt also has appt 09-19-15

## 2015-07-18 NOTE — Telephone Encounter (Signed)
-----   Message from Skeet Latch, MD sent at 07/14/2015  5:33 PM EDT ----- PFTs show moderate airway obstruction and emphysema.  This could be part of why his pulmonary pressures are elevated.  Please refer to Dr. Lake Bells.

## 2015-07-18 NOTE — Telephone Encounter (Signed)
Patient seen since ov on 4/25 scheduled, cancelled appointment  Advised of PFT's and referral in Epic

## 2015-07-19 ENCOUNTER — Ambulatory Visit: Payer: Self-pay | Admitting: Occupational Therapy

## 2015-07-19 ENCOUNTER — Telehealth: Payer: Self-pay | Admitting: Cardiovascular Disease

## 2015-07-19 NOTE — Telephone Encounter (Signed)
Attempted to call pt back x2. Phone number listed rang 1-2 times then made a busy signal. No ability to leave message.

## 2015-07-19 NOTE — Telephone Encounter (Signed)
New message    Patient calling [employer] cut the wrong phone off . Calling back regarding appt Dr. Lake Bells.

## 2015-07-20 ENCOUNTER — Other Ambulatory Visit: Payer: Self-pay | Admitting: Surgery

## 2015-07-20 NOTE — Telephone Encounter (Signed)
Spoke with pt and the issue was clarification of appts. Confirmed upcoming appts listed in EPIC with pt and he verbalized understanding. No further issues at this time.

## 2015-07-21 ENCOUNTER — Ambulatory Visit: Payer: Self-pay | Admitting: Occupational Therapy

## 2015-07-24 ENCOUNTER — Ambulatory Visit: Payer: Self-pay | Admitting: Occupational Therapy

## 2015-07-26 ENCOUNTER — Ambulatory Visit: Payer: Self-pay | Admitting: Occupational Therapy

## 2015-07-28 ENCOUNTER — Ambulatory Visit: Payer: Self-pay | Admitting: Occupational Therapy

## 2015-07-31 ENCOUNTER — Ambulatory Visit: Payer: Self-pay | Admitting: Occupational Therapy

## 2015-08-01 ENCOUNTER — Encounter (HOSPITAL_BASED_OUTPATIENT_CLINIC_OR_DEPARTMENT_OTHER): Payer: Self-pay | Attending: Surgery

## 2015-08-01 ENCOUNTER — Ambulatory Visit: Payer: Self-pay | Admitting: Cardiovascular Disease

## 2015-08-02 ENCOUNTER — Ambulatory Visit: Payer: Self-pay | Admitting: Occupational Therapy

## 2015-08-04 ENCOUNTER — Ambulatory Visit: Payer: Self-pay | Admitting: Occupational Therapy

## 2015-08-07 ENCOUNTER — Ambulatory Visit: Payer: Self-pay | Admitting: Occupational Therapy

## 2015-08-08 ENCOUNTER — Encounter (HOSPITAL_BASED_OUTPATIENT_CLINIC_OR_DEPARTMENT_OTHER): Payer: Self-pay | Admitting: *Deleted

## 2015-08-09 ENCOUNTER — Ambulatory Visit: Payer: Self-pay | Admitting: Occupational Therapy

## 2015-08-11 ENCOUNTER — Ambulatory Visit: Payer: Self-pay | Admitting: Occupational Therapy

## 2015-08-14 ENCOUNTER — Ambulatory Visit: Payer: Self-pay | Admitting: Occupational Therapy

## 2015-08-14 NOTE — Progress Notes (Signed)
Dr. Marcell Barlow reviewed pt's history and examined airway - ok for surgery. Requested BMP from [facility] - spoke with Cain Saupe.

## 2015-08-16 ENCOUNTER — Ambulatory Visit: Payer: Self-pay | Admitting: Occupational Therapy

## 2015-08-16 NOTE — H&P (Signed)
Scott Shelton 07/20/2015 4:12 PM Location: [facility] Patient #: [id number] DOB: [birth year] Single / Language: Scott Shelton / Race: White Male   History of Present Illness ([name] A. Ninfa [name] MD; 07/20/2015 4:42 PM) The patient is a 65 year old male who presents for a melanoma biopsy follow-up. This gentleman is referred by Dr. Druscilla Brownie after the recent diagnosis of a melanoma on his back. This was confirmed on shave biopsy. It was approximately 0.66 mm in depth. He was recently in the hospital for pulmonary embolism and is on Coumadin. He also has chronic lymphedema and may be on anticoagulation for the next year or more. He has no previous history of melanoma. The pulmonary embolism was probably precipitated by sepsis with cellulitis of his lower extremities and renal failure. His renal failure has now improved. He is currently in a rehabilitation facility and is doing well.   Other Problems [name], CMA; 07/20/2015 4:12 PM) Atrial Fibrillation Chronic Renal Failure Syndrome Depression High blood pressure Pulmonary Embolism / Blood Clot in Legs Sleep Apnea  Past Surgical History [name], CMA; 07/20/2015 4:12 PM) No pertinent past surgical history  Diagnostic Studies History [name], CMA; 07/20/2015 4:12 PM) Colonoscopy never  Allergies [name], CMA; 07/20/2015 4:13 PM) No Known Drug Allergies04/13/2017  Medication History [name], CMA; 07/20/2015 4:13 PM) Warfarin Sodium (5MG  Tablet, Oral) Active. Medications Reconciled  Social History [name], [place]; 07/20/2015 4:12 PM) Alcohol use Occasional alcohol use. No caffeine use No drug use Tobacco use Never smoker.  Family History [name], [place]; 07/20/2015 4:12 PM) Cancer Mother. Cerebrovascular Accident Father. Heart Disease Father.    Review of Systems [name] CMA; 07/20/2015 4:12 PM) General Not Present- Appetite Loss, Chills, Fatigue, Fever, Night  Sweats, Weight Gain and Weight Loss. Skin Present- Change in Wart/Mole and New Lesions. Not Present- Dryness, Hives, Jaundice, Non-Healing Wounds, Rash and Ulcer. HEENT Present- Wears glasses/contact lenses. Not Present- Earache, Hearing Loss, Hoarseness, Nose Bleed, Oral Ulcers, Ringing in the Ears, Seasonal Allergies, Sinus Pain, Sore Throat, Visual Disturbances and Yellow Eyes. Respiratory Not Present- Bloody sputum, Chronic Cough, Difficulty Breathing, Snoring and Wheezing. Breast Not Present- Breast Mass, Breast Pain, Nipple Discharge and Skin Changes. Cardiovascular Present- Palpitations. Not Present- Chest Pain, Difficulty Breathing Lying Down, Leg Cramps, Rapid Heart Rate, Shortness of Breath and Swelling of Extremities. Gastrointestinal Present- Hemorrhoids. Not Present- Abdominal Pain, Bloating, Bloody Stool, Change in Bowel Habits, Chronic diarrhea, Constipation, Difficulty Swallowing, Excessive gas, Gets full quickly at meals, Indigestion, Nausea, Rectal Pain and Vomiting.  Vitals [name] CMA; 07/20/2015 4:13 PM) 07/20/2015 4:13 PM Weight: 332 lb Height: 68in Body Surface Area: 2.54 m Body Mass Index: 50.48 kg/m  Temp.: 97.41F  Pulse: 75 (Regular)  BP: 136/70 (Sitting, Left Arm, Standard)   Physical Exam (Teal Raben A. Ninfa [name] MD; 07/20/2015 4:42 PM) The physical exam findings are as follows: Note:Generally he is well in appearance. Lungs are clear bilaterally Cardiovascular is regular rate and rhythm There are 2 biopsy sites on his left upper back. The larger superior incision is the one that had the melanoma. 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 He does not need a lymph node biopsy. He will need to be bridged with Lovenox perioperatively. I discussed the surgical procedure with him in detail. I discussed the risk which  includes but is not limited to bleeding,  infection, need for further surgery, etc. He understands and agrees to proceed with surgery.

## 2015-08-17 ENCOUNTER — Encounter (HOSPITAL_BASED_OUTPATIENT_CLINIC_OR_DEPARTMENT_OTHER)
Admission: RE | Disposition: A | Discharge status: Skilled Nursing Facility | Payer: Self-pay | Source: Ambulatory Visit | Attending: Surgery

## 2015-08-17 ENCOUNTER — Ambulatory Visit (HOSPITAL_BASED_OUTPATIENT_CLINIC_OR_DEPARTMENT_OTHER)
Admission: RE | Admit: 2015-08-17 | Discharge: 2015-08-17 | Disposition: A | Discharge status: Skilled Nursing Facility | Payer: Self-pay | Source: Ambulatory Visit | Attending: Surgery | Admitting: Surgery

## 2015-08-17 ENCOUNTER — Encounter (HOSPITAL_BASED_OUTPATIENT_CLINIC_OR_DEPARTMENT_OTHER): Payer: Self-pay

## 2015-08-17 ENCOUNTER — Ambulatory Visit (HOSPITAL_BASED_OUTPATIENT_CLINIC_OR_DEPARTMENT_OTHER): Payer: Self-pay | Admitting: Anesthesiology

## 2015-08-17 DIAGNOSIS — Z6841 Body Mass Index (BMI) 40.0 and over, adult: Secondary | ICD-10-CM | POA: Insufficient documentation

## 2015-08-17 DIAGNOSIS — I4891 Unspecified atrial fibrillation: Secondary | ICD-10-CM | POA: Insufficient documentation

## 2015-08-17 DIAGNOSIS — C4359 Malignant melanoma of other part of trunk: Secondary | ICD-10-CM | POA: Insufficient documentation

## 2015-08-17 DIAGNOSIS — I129 Hypertensive chronic kidney disease with stage 1 through stage 4 chronic kidney disease, or unspecified chronic kidney disease: Secondary | ICD-10-CM | POA: Insufficient documentation

## 2015-08-17 DIAGNOSIS — Z7901 Long term (current) use of anticoagulants: Secondary | ICD-10-CM | POA: Insufficient documentation

## 2015-08-17 DIAGNOSIS — Z79899 Other long term (current) drug therapy: Secondary | ICD-10-CM | POA: Insufficient documentation

## 2015-08-17 DIAGNOSIS — Z86711 Personal history of pulmonary embolism: Secondary | ICD-10-CM | POA: Insufficient documentation

## 2015-08-17 DIAGNOSIS — F329 Major depressive disorder, single episode, unspecified: Secondary | ICD-10-CM | POA: Insufficient documentation

## 2015-08-17 DIAGNOSIS — N189 Chronic kidney disease, unspecified: Secondary | ICD-10-CM | POA: Insufficient documentation

## 2015-08-17 DIAGNOSIS — G4730 Sleep apnea, unspecified: Secondary | ICD-10-CM | POA: Insufficient documentation

## 2015-08-17 HISTORY — PX: MELANOMA EXCISION: SHX5266

## 2015-08-17 HISTORY — DX: Personal history of pulmonary embolism: Z86.711

## 2015-08-17 HISTORY — DX: Unspecified atrial fibrillation: I48.91

## 2015-08-17 HISTORY — DX: Pulmonary hypertension, unspecified: I27.20

## 2015-08-17 HISTORY — DX: Chronic kidney disease, unspecified: N18.9

## 2015-08-17 HISTORY — DX: Localized edema: R60.0

## 2015-08-17 HISTORY — DX: Personal history of diseases of the skin and subcutaneous tissue: Z87.2

## 2015-08-17 SURGERY — EXCISION, MELANOMA
Anesthesia: General | Site: Back

## 2015-08-17 MED ORDER — HYDROCODONE-ACETAMINOPHEN 5-325 MG PO TABS: 1.0000 | ORAL_TABLET | ORAL | Status: DC | PRN

## 2015-08-17 MED ORDER — PROPOFOL 10 MG/ML IV BOLUS
INTRAVENOUS | Status: DC | PRN
  Administered 2015-08-17: 300 mg via INTRAVENOUS

## 2015-08-17 MED ORDER — LIDOCAINE 2% (20 MG/ML) 5 ML SYRINGE
INTRAMUSCULAR | Status: AC
  Filled 2015-08-17: qty 5

## 2015-08-17 MED ORDER — GLYCOPYRROLATE 0.2 MG/ML IJ SOLN: 0.2000 mg | Freq: Once | INTRAMUSCULAR | Status: DC | PRN

## 2015-08-17 MED ORDER — BUPIVACAINE-EPINEPHRINE 0.5% -1:200000 IJ SOLN
INTRAMUSCULAR | Status: DC | PRN
  Administered 2015-08-17: 10 mL

## 2015-08-17 MED ORDER — BUPIVACAINE-EPINEPHRINE (PF) 0.5% -1:200000 IJ SOLN
INTRAMUSCULAR | Status: AC
Start: 2015-08-17 — End: 2015-08-17
  Filled 2015-08-17: qty 30

## 2015-08-17 MED ORDER — OXYCODONE HCL 5 MG/5ML PO SOLN: 5.0000 mg | Freq: Once | ORAL | Status: DC | PRN

## 2015-08-17 MED ORDER — LIDOCAINE HCL (CARDIAC) 20 MG/ML IV SOLN
INTRAVENOUS | Status: DC | PRN
  Administered 2015-08-17: 100 mg via INTRAVENOUS

## 2015-08-17 MED ORDER — CEFAZOLIN SODIUM-DEXTROSE 2-4 GM/100ML-% IV SOLN
2.0000 g | INTRAVENOUS | Status: AC
  Administered 2015-08-17: 2 g via INTRAVENOUS

## 2015-08-17 MED ORDER — SCOPOLAMINE 1 MG/3DAYS TD PT72: 1.0000 | MEDICATED_PATCH | Freq: Once | TRANSDERMAL | Status: DC | PRN

## 2015-08-17 MED ORDER — OXYCODONE HCL 5 MG PO TABS: 5.0000 mg | ORAL_TABLET | Freq: Once | ORAL | Status: DC | PRN

## 2015-08-17 MED ORDER — CEFAZOLIN SODIUM-DEXTROSE 2-4 GM/100ML-% IV SOLN
INTRAVENOUS | Status: AC
  Filled 2015-08-17: qty 100

## 2015-08-17 MED ORDER — ONDANSETRON HCL 4 MG/2ML IJ SOLN
INTRAMUSCULAR | Status: DC | PRN
  Administered 2015-08-17: 4 mg via INTRAVENOUS

## 2015-08-17 MED ORDER — ONDANSETRON HCL 4 MG/2ML IJ SOLN
INTRAMUSCULAR | Status: AC
  Filled 2015-08-17: qty 2

## 2015-08-17 MED ORDER — MIDAZOLAM HCL 2 MG/2ML IJ SOLN
1.0000 mg | INTRAMUSCULAR | Status: DC | PRN
  Administered 2015-08-17: 2 mg via INTRAVENOUS

## 2015-08-17 MED ORDER — HYDROMORPHONE HCL 1 MG/ML IJ SOLN: 0.2500 mg | INTRAMUSCULAR | Status: DC | PRN

## 2015-08-17 MED ORDER — SUCCINYLCHOLINE CHLORIDE 200 MG/10ML IV SOSY
PREFILLED_SYRINGE | INTRAVENOUS | Status: AC
  Filled 2015-08-17: qty 10

## 2015-08-17 MED ORDER — FENTANYL CITRATE (PF) 100 MCG/2ML IJ SOLN
INTRAMUSCULAR | Status: AC
  Filled 2015-08-17: qty 2

## 2015-08-17 MED ORDER — CEFAZOLIN SODIUM 1-5 GM-% IV SOLN
INTRAVENOUS | Status: AC
  Filled 2015-08-17: qty 50

## 2015-08-17 MED ORDER — PROMETHAZINE HCL 25 MG/ML IJ SOLN: 6.2500 mg | INTRAMUSCULAR | Status: DC | PRN

## 2015-08-17 MED ORDER — MIDAZOLAM HCL 2 MG/2ML IJ SOLN
INTRAMUSCULAR | Status: AC
  Filled 2015-08-17: qty 2

## 2015-08-17 MED ORDER — FENTANYL CITRATE (PF) 100 MCG/2ML IJ SOLN
50.0000 ug | INTRAMUSCULAR | Status: DC | PRN
  Administered 2015-08-17: 100 ug via INTRAVENOUS

## 2015-08-17 MED ORDER — BUPIVACAINE HCL (PF) 0.5 % IJ SOLN
INTRAMUSCULAR | Status: AC
  Filled 2015-08-17: qty 30

## 2015-08-17 MED ORDER — DEXAMETHASONE SODIUM PHOSPHATE 10 MG/ML IJ SOLN
INTRAMUSCULAR | Status: AC
  Filled 2015-08-17: qty 1

## 2015-08-17 MED ORDER — DEXAMETHASONE SODIUM PHOSPHATE 4 MG/ML IJ SOLN
INTRAMUSCULAR | Status: DC | PRN
  Administered 2015-08-17: 10 mg via INTRAVENOUS

## 2015-08-17 MED ORDER — SUCCINYLCHOLINE CHLORIDE 20 MG/ML IJ SOLN
INTRAMUSCULAR | Status: DC | PRN
  Administered 2015-08-17: 200 mg via INTRAVENOUS

## 2015-08-17 MED ORDER — LACTATED RINGERS IV SOLN
INTRAVENOUS | Status: DC
  Administered 2015-08-17 (×2): via INTRAVENOUS

## 2015-08-17 MED ORDER — PROPOFOL 500 MG/50ML IV EMUL
INTRAVENOUS | Status: AC
  Filled 2015-08-17: qty 50

## 2015-08-17 SURGICAL SUPPLY — 49 items
APPLIER CLIP 9.375 MED OPEN (MISCELLANEOUS)
BLADE CLIPPER SURG (BLADE) IMPLANT
BLADE HEX COATED 2.75 (ELECTRODE) ×4 IMPLANT
BLADE SURG 15 STRL LF DISP TIS (BLADE) ×2 IMPLANT
BLADE SURG 15 STRL SS (BLADE) ×2
CANISTER SUCT 1200ML W/VALVE (MISCELLANEOUS) IMPLANT
CHLORAPREP W/TINT 26ML (MISCELLANEOUS) ×4 IMPLANT
CLIP APPLIE 9.375 MED OPEN (MISCELLANEOUS) IMPLANT
COVER BACK TABLE 60X90IN (DRAPES) ×4 IMPLANT
COVER MAYO STAND STRL (DRAPES) ×4 IMPLANT
COVER PROBE W GEL 5X96 (DRAPES) IMPLANT
DECANTER SPIKE VIAL GLASS SM (MISCELLANEOUS) IMPLANT
DRAPE LAPAROSCOPIC ABDOMINAL (DRAPES) ×4 IMPLANT
DRAPE LAPAROTOMY 100X72 PEDS (DRAPES) IMPLANT
DRAPE UTILITY XL STRL (DRAPES) ×4 IMPLANT
ELECT REM PT RETURN 9FT ADLT (ELECTROSURGICAL) ×4
ELECTRODE REM PT RTRN 9FT ADLT (ELECTROSURGICAL) ×2 IMPLANT
GLOVE BIOGEL PI IND STRL 7.0 (GLOVE) ×4 IMPLANT
GLOVE BIOGEL PI INDICATOR 7.0 (GLOVE) ×4
GLOVE SURG SIGNA 7.5 PF LTX (GLOVE) ×4 IMPLANT
GLOVE SURG SS PI 6.5 STRL IVOR (GLOVE) ×4 IMPLANT
GOWN STRL REUS W/ TWL LRG LVL3 (GOWN DISPOSABLE) ×2 IMPLANT
GOWN STRL REUS W/ TWL XL LVL3 (GOWN DISPOSABLE) ×2 IMPLANT
GOWN STRL REUS W/TWL LRG LVL3 (GOWN DISPOSABLE) ×2
GOWN STRL REUS W/TWL XL LVL3 (GOWN DISPOSABLE) ×2
HEMOSTAT SURGICEL 2X14 (HEMOSTASIS) IMPLANT
LIQUID BAND (GAUZE/BANDAGES/DRESSINGS) IMPLANT
NEEDLE HYPO 25X1 1.5 SAFETY (NEEDLE) ×4 IMPLANT
NS IRRIG 1000ML POUR BTL (IV SOLUTION) ×4 IMPLANT
PACK BASIN DAY SURGERY FS (CUSTOM PROCEDURE TRAY) ×4 IMPLANT
PENCIL BUTTON HOLSTER BLD 10FT (ELECTRODE) ×4 IMPLANT
PIN SAFETY STERILE (MISCELLANEOUS) IMPLANT
SLEEVE SCD COMPRESS KNEE MED (MISCELLANEOUS) IMPLANT
SPONGE GAUZE 4X4 12PLY STER LF (GAUZE/BANDAGES/DRESSINGS) ×4 IMPLANT
SPONGE LAP 18X18 X RAY DECT (DISPOSABLE) IMPLANT
SPONGE LAP 4X18 X RAY DECT (DISPOSABLE) ×4 IMPLANT
SUT ETHILON 2 0 FS 18 (SUTURE) ×8 IMPLANT
SUT MNCRL AB 4-0 PS2 18 (SUTURE) ×4 IMPLANT
SUT SILK 2 0 FS (SUTURE) ×4 IMPLANT
SUT VIC AB 3-0 SH 27 (SUTURE) ×2
SUT VIC AB 3-0 SH 27X BRD (SUTURE) ×2 IMPLANT
SYR BULB 3OZ (MISCELLANEOUS) ×4 IMPLANT
SYR CONTROL 10ML LL (SYRINGE) ×4 IMPLANT
SYRINGE 1CC 27X.5 TB SAFETYGLD (MISCELLANEOUS) IMPLANT
TOWEL OR 17X24 6PK STRL BLUE (TOWEL DISPOSABLE) ×4 IMPLANT
TOWEL OR NON WOVEN STRL DISP B (DISPOSABLE) ×4 IMPLANT
TUBE CONNECTING 20'X1/4 (TUBING)
TUBE CONNECTING 20X1/4 (TUBING) IMPLANT
YANKAUER SUCT BULB TIP NO VENT (SUCTIONS) IMPLANT

## 2015-08-17 NOTE — Transfer of Care (Signed)
Immediate Anesthesia Transfer of Care Note  Patient: Scott Shelton  Procedure(s) Performed: Procedure(s): WIDE EXCISION MELANOMA  OF THE BACK  (Left)  Patient Location: PACU  Anesthesia Type:General  Level of Consciousness: sedated  Airway & Oxygen Therapy: Patient Spontanous Breathing and Patient connected to face mask oxygen  Post-op Assessment: Report given to RN and Post -op Vital signs reviewed and stable  Post vital signs: Reviewed and stable  Last Vitals:  Filed Vitals:   08/17/15 1137  BP: 138/53  Pulse: 61  Temp: 36.8 C  Resp: 20    Last Pain: There were no vitals filed for this visit.       Complications: No apparent anesthesia complications

## 2015-08-17 NOTE — Discharge Instructions (Signed)
Ok to shower starting tomorrow  Dry bandage to wound daily  Ice pack also for pain  Ok to Resume lovenox and coumadin this evening  Please start the hydrocodone (vicodin/norco) today for pain     Post Anesthesia Home Care Instructions  Activity: Get plenty of rest for the remainder of the day. A responsible adult should stay with you for 24 hours following the procedure.  For the next 24 hours, DO NOT: -Drive a car -Paediatric nurse -Drink alcoholic beverages -Take any medication unless instructed by your physician -Make any legal decisions or sign important papers.  Meals: Start with liquid foods such as gelatin or soup. Progress to regular foods as tolerated. Avoid greasy, spicy, heavy foods. If nausea and/or vomiting occur, drink only clear liquids until the nausea and/or vomiting subsides. Call your physician if vomiting continues.  Special Instructions/Symptoms: Your throat may feel dry or sore from the anesthesia or the breathing tube placed in your throat during surgery. If this causes discomfort, gargle with warm salt water. The discomfort should disappear within 24 hours.  If you had a scopolamine patch placed behind your ear for the management of post- operative nausea and/or vomiting:  1. The medication in the patch is effective for 72 hours, after which it should be removed.  Wrap patch in a tissue and discard in the trash. Wash hands thoroughly with soap and water. 2. You may remove the patch earlier than 72 hours if you experience unpleasant side effects which may include dry mouth, dizziness or visual disturbances. 3. Avoid touching the patch. Wash your hands with soap and water after contact with the patch.

## 2015-08-17 NOTE — Anesthesia Postprocedure Evaluation (Signed)
Anesthesia Post Note  Patient: Scott Shelton  Procedure(s) Performed: Procedure(s) (LRB): WIDE EXCISION MELANOMA  OF THE BACK  (Left)  Patient location during evaluation: PACU Anesthesia Type: General Level of consciousness: awake and alert Pain management: pain level controlled Vital Signs Assessment: post-procedure vital signs reviewed and stable Respiratory status: spontaneous breathing, nonlabored ventilation, respiratory function stable and patient connected to nasal cannula oxygen Cardiovascular status: blood pressure returned to baseline and stable Postop Assessment: no signs of nausea or vomiting Anesthetic complications: no    Last Vitals:  Filed Vitals:   08/17/15 1345 08/17/15 1400  BP: 118/59 117/59  Pulse: 57 67  Temp: 36.9 C   Resp: 18 14    Last Pain:  Filed Vitals:   08/17/15 1410  PainSc: 0-No pain                 Jaylea Plourde S

## 2015-08-17 NOTE — Op Note (Signed)
WIDE EXCISION MELANOMA  OF THE BACK   Procedure Note  Scott Shelton 08/17/2015   Pre-op Diagnosis: Melanoma of the back      Post-op Diagnosis: same  Procedure(s): WIDE EXCISION MELANOMA  OF THE BACK  (7 cm )  Surgeon(s): [name], MD  Anesthesia: General  Staff:  Circulator: Ted Mcalpine, RN Scrub Person: Lynelle Doctor, RN OR Clinical Technician: Daphene Calamity; Eda Paschal  Estimated Blood Loss: Minimal               Specimens: sent to path          [facility] A   Date: 08/17/2015  Time: 1:34 PM

## 2015-08-17 NOTE — Anesthesia Procedure Notes (Signed)
Procedure Name: Intubation Date/Time: 08/17/2015 1:04 PM Performed by: Lieutenant Diego Pre-anesthesia Checklist: Patient identified, Emergency Drugs available, Suction available and Patient being monitored Patient Re-evaluated:Patient Re-evaluated prior to inductionOxygen Delivery Method: Circle System Utilized Preoxygenation: Pre-oxygenation with 100% oxygen Intubation Type: IV induction Ventilation: Mask ventilation without difficulty Laryngoscope Size: Miller and 2 Grade View: Grade I Tube type: Oral Tube size: 8.0 mm Number of attempts: 1 Airway Equipment and Method: Stylet and Oral airway Placement Confirmation: ETT inserted through vocal cords under direct vision,  positive ETCO2 and breath sounds checked- equal and bilateral Secured at: 24 cm Tube secured with: Tape Dental Injury: Teeth and Oropharynx as per pre-operative assessment

## 2015-08-17 NOTE — Anesthesia Preprocedure Evaluation (Addendum)
Anesthesia Evaluation  Patient identified by MRN, date of birth, ID band Patient awake    Reviewed: Allergy & Precautions, NPO status , Patient's Chart, lab work & pertinent test results  Airway Mallampati: II  TM Distance: >3 FB Neck ROM: Full    Dental  (+) Chipped, Dental Advisory Given   Pulmonary PE   Pulmonary exam normal breath sounds clear to auscultation       Cardiovascular negative cardio ROS Normal cardiovascular exam+ dysrhythmias Atrial Fibrillation  Rhythm:Regular Rate:Normal     Neuro/Psych negative neurological ROS  negative psych ROS   GI/Hepatic negative GI ROS, Neg liver ROS,   Endo/Other  Morbid obesity  Renal/GU Renal InsufficiencyRenal disease  negative genitourinary   Musculoskeletal negative musculoskeletal ROS (+)   Abdominal   Peds negative pediatric ROS (+)  Hematology negative hematology ROS (+)   Anesthesia Other Findings   Reproductive/Obstetrics negative OB ROS                            Anesthesia Physical Anesthesia Plan  ASA: III  Anesthesia Plan: General   Post-op Pain Management:    Induction: Intravenous  Airway Management Planned: Oral ETT  Additional Equipment:   Intra-op Plan:   Post-operative Plan: Extubation in OR  Informed Consent: I have reviewed the patients History and Physical, chart, labs and discussed the procedure including the risks, benefits and alternatives for the proposed anesthesia with the patient or authorized representative who has indicated his/her understanding and acceptance.   Dental advisory given  Plan Discussed with: CRNA and Surgeon  Anesthesia Plan Comments:         Anesthesia Quick Evaluation

## 2015-08-17 NOTE — Interval H&P Note (Signed)
History and Physical Interval Note: no change in H and P  08/17/2015 12:35 PM  Scott Shelton  has presented today for surgery, with the diagnosis of Melonoma of the back   The various methods of treatment have been discussed with the patient and family. After consideration of risks, benefits and other options for treatment, the patient has consented to  Procedure(s): [place]  (N/A) as a surgical intervention .  The patient's history has been reviewed, patient examined, no change in status, stable for surgery.  I have reviewed the patient's chart and labs.  Questions were answered to the patient's satisfaction.     [name] A

## 2015-08-18 ENCOUNTER — Encounter (HOSPITAL_BASED_OUTPATIENT_CLINIC_OR_DEPARTMENT_OTHER): Payer: Self-pay | Admitting: Surgery

## 2015-08-18 ENCOUNTER — Ambulatory Visit: Payer: Self-pay | Admitting: Occupational Therapy

## 2015-08-18 NOTE — Op Note (Signed)
NAME:  Scott Shelton, Scott Shelton                    ACCOUNT NO.:  MEDICAL RECORD NO.:  [id number]  LOCATION:                                 FACILITY:  PHYSICIAN:  [name], M.D. DATE OF BIRTH:  May 17, 1950  DATE OF PROCEDURE:  08/17/2015 DATE OF DISCHARGE:                              OPERATIVE REPORT   PREOPERATIVE DIAGNOSIS:  Melanoma of the back.  POSTOPERATIVE DIAGNOSIS:  Melanoma of the back.  PROCEDURE:  Wide excision of melanoma of the back.  SURGEON:  [name], MD  ANESTHESIA:  General and 0.5% Marcaine with epinephrine.  ESTIMATED BLOOD LOSS:  Minimal.  INDICATIONS:  This is a 65 year old gentleman with multiple medical problems, who is also on Coumadin for pulmonary embolism.  He now presents after having had a biopsy of a suspicious skin lesion on his back.  His biopsy showed melanoma.  Wide excision has been recommended.  PROCEDURE IN DETAIL:  The patient was brought to the operating room, identified as [employer].  He was placed supine on the operating room table and general anesthesia was induced.  The patient was then placed into the right lateral decubitus position.  The upper back was then prepped and draped in usual sterile fashion.  I anesthetized the skin around the previous excision site with Marcaine.  I then performed a 7 cm wide excision going circumferentially around the previous scar in elliptical fashion with a scalpel.  I took this down to the subcutaneous tissue, went all the way down to the fascia with the electrocautery. Once I widely excised the melanoma, I marked at the 12 o'clock position with a long stitch and at the 3 o'clock position with a short stitch. The specimen was sent to Pathology for evaluation.  I then achieved hemostasis with the cautery.  I then closed subcutaneous tissue with interrupted 3-0 Vicryl sutures and closed the skin with interrupted 2-0 nylon sutures.  Gauze and tape were then applied.  The patient  tolerated the procedure well.  All the counts were correct at the end of procedure.  The patient was then extubated in the operating room and taken in a stable condition to recovery room.     [name], M.D.     DB/MEDQ  D:  08/17/2015  T:  08/18/2015  Job:  [id number]

## 2015-08-21 ENCOUNTER — Encounter (HOSPITAL_BASED_OUTPATIENT_CLINIC_OR_DEPARTMENT_OTHER): Payer: Self-pay | Admitting: Surgery

## 2015-08-21 ENCOUNTER — Ambulatory Visit: Payer: Self-pay | Admitting: Occupational Therapy

## 2015-08-23 ENCOUNTER — Ambulatory Visit: Payer: Self-pay | Admitting: Occupational Therapy

## 2015-08-25 ENCOUNTER — Ambulatory Visit: Payer: Self-pay | Admitting: Occupational Therapy

## 2015-08-25 ENCOUNTER — Institutional Professional Consult (permissible substitution): Payer: Self-pay | Admitting: Pulmonary Disease

## 2015-08-28 ENCOUNTER — Ambulatory Visit: Payer: Self-pay | Admitting: Occupational Therapy

## 2015-08-30 ENCOUNTER — Ambulatory Visit: Payer: Self-pay | Admitting: Occupational Therapy

## 2015-09-01 ENCOUNTER — Ambulatory Visit (INDEPENDENT_AMBULATORY_CARE_PROVIDER_SITE_OTHER): Payer: Self-pay | Admitting: Pulmonary Disease

## 2015-09-01 ENCOUNTER — Ambulatory Visit: Payer: Self-pay | Admitting: Occupational Therapy

## 2015-09-01 ENCOUNTER — Ambulatory Visit (HOSPITAL_BASED_OUTPATIENT_CLINIC_OR_DEPARTMENT_OTHER): Payer: Self-pay

## 2015-09-01 ENCOUNTER — Encounter: Payer: Self-pay | Admitting: Pulmonary Disease

## 2015-09-01 VITALS — BP 116/68 | HR 61 | Ht 72.0 in | Wt 310.0 lb

## 2015-09-01 DIAGNOSIS — I480 Paroxysmal atrial fibrillation: Secondary | ICD-10-CM

## 2015-09-01 DIAGNOSIS — R911 Solitary pulmonary nodule: Secondary | ICD-10-CM | POA: Insufficient documentation

## 2015-09-01 DIAGNOSIS — I2699 Other pulmonary embolism without acute cor pulmonale: Secondary | ICD-10-CM

## 2015-09-01 DIAGNOSIS — I272 Other secondary pulmonary hypertension: Secondary | ICD-10-CM

## 2015-09-01 DIAGNOSIS — R0600 Dyspnea, unspecified: Secondary | ICD-10-CM

## 2015-09-01 DIAGNOSIS — J9601 Acute respiratory failure with hypoxia: Secondary | ICD-10-CM

## 2015-09-01 NOTE — Assessment & Plan Note (Signed)
Today on exam he appears to be in sinus rhythm.  Given his obesity and daytime somnolence I think it's very reasonable to evaluate him for obstructive sleep apnea as a potential cause for both his pulmonary hypertension and his atrial fibrillation.  Plan: Continue follow-up with Dr. Oval Linsey Consider stopping anticoagulation and amiodarone if he remains in sinus rhythm by August 2017, obviously we will coordinate any changes in his medications with cardiology Continue plans for polysomnogram

## 2015-09-01 NOTE — Progress Notes (Signed)
Subjective:    Patient ID: Scott Shelton, male    DOB: [birth year], 65 y.o.   MRN: [id number]  HPI Chief Complaint  Patient presents with  . Advice Only    Referred by Dr. Oval Linsey for abnormal PFT.  PFT in Epic.     Scott Shelton is here to see me today because he had pulmonary hypertension and a pulmonary embolism seen in February 2017.  Dr. Oval Linsey referred him to me.  He notes that he had a pulmonary embolism in 05/2015.  He says that he has suffered from severe lymphedema for quite some time and he worries that this may have contributed to his DVT.  He was hospitalized for a urinary infection with septic shock, renal failure and he nearly requiring dialysis in January 2017.  There was suspicion for sleep apnea. He developed abrupt onset shortness of breath about two weeks later which lead to his subsequent hospitalization.  He was found to have a large acute pulmonary embolism (bilateral, segmental clot burden).  He was treated with anticoagulation at this time and had atrial fibrillation.    There has been conjecture that he has sleep apnea.  He has been told that he snores.  He sometimes feels sleepy with afternoon lack of concentration, no driving difficulty.  No morning headaches.   He had never been told that he had a lung problem prior to this episode.  However he had noted some dyspnea in 2016 with activity while helping his family at home.    No respiratory illnesses as a child.  He never smoked.  He worked as an [employer] officer, but no significant chemical or dust or fumes exposure.  He may have worked around asbestos.  He notes that his exercise tolerance has improved and his dyspnea has improved.      Past Medical History  Diagnosis Date  . GERD (gastroesophageal reflux disease)   . A-fib ([place])   . Hx pulmonary embolism     with hypoxic respiratory failure  . Chronic kidney disease     STAGE III  . History of cellulitis 05/21/15    BLE  . Pulmonary  hypertension ([place])   . Bilateral leg edema      Family History  Problem Relation Age of Onset  . Cancer Mother     Spinal Carcinoma     Social History   Social History  . Marital Status: Single    Spouse Name: N/A  . Number of Children: N/A  . Years of Education: N/A   Occupational History  . Not on file.   Social History Main Topics  . Smoking status: Never Smoker   . Smokeless tobacco: Never Used  . Alcohol Use: No  . Drug Use: No  . Sexual Activity: Not on file   Other Topics Concern  . Not on file   Social History Narrative     No Known Allergies   Outpatient Prescriptions Prior to Visit  Medication Sig Dispense Refill  . amiodarone (PACERONE) 200 MG tablet Take 1 tablet (200 mg total) by mouth daily. 90 tablet 1  . FLUoxetine (PROZAC) 20 MG capsule Take 1 capsule (20 mg total) by mouth daily. 90 capsule 1  . furosemide (LASIX) 20 MG tablet Take 3 tablets (60 mg total) by mouth daily. (Patient taking differently: Take 40 mg by mouth 2 (two) times daily. ) 30 tablet 0  . HYDROcodone-acetaminophen (NORCO) 5-325 MG tablet Take 1-2 tablets by mouth every 4 (four) hours  as needed. 40 tablet 0  . potassium chloride (K-DUR) 10 MEQ tablet Take 2 tablets (20 mEq total) by mouth daily. 30 tablet 0  . traZODone (DESYREL) 50 MG tablet Take 1 tablet (50 mg total) by mouth at bedtime. 30 tablet 1  . warfarin (COUMADIN) 5 MG tablet Take 1 tablet (5 mg total) by mouth daily at 6 PM. Start from Saturday 2/25 (Patient taking differently: Take 5 mg by mouth daily at 6 PM. 5mg  qod, alternating with 7.5mg  qod.) 30 tablet 0  . enoxaparin (LOVENOX) 150 MG/ML injection Inject 150 mg into the skin. Reported on 09/01/2015    . hydrocerin (EUCERIN) CREA Apply 1 application topically daily. (Patient not taking: Reported on 09/01/2015) 454 g 1   No facility-administered medications prior to visit.       Review of Systems  Constitutional: Negative for fever and unexpected weight change.    HENT: Positive for congestion and sore throat. Negative for dental problem, ear pain, nosebleeds, postnasal drip, rhinorrhea, sinus pressure, sneezing and trouble swallowing.   Eyes: Negative for redness and itching.  Respiratory: Positive for shortness of breath. Negative for cough, chest tightness and wheezing.   Cardiovascular: Negative for palpitations and leg swelling.  Gastrointestinal: Negative for nausea and vomiting.  Genitourinary: Negative for dysuria.  Musculoskeletal: Negative for joint swelling.  Skin: Negative for rash.  Neurological: Negative for headaches.  Hematological: Does not bruise/bleed easily.  Psychiatric/Behavioral: Negative for dysphoric mood. The patient is not nervous/anxious.        Objective:   Physical Exam  Filed Vitals:   09/01/15 1142  BP: 116/68  Pulse: 61  Height: 6' (1.829 m)  Weight: 310 lb (140.615 kg)  SpO2: 96%  RA  Gen: morbidly obese, chronically ill appearing, no acute distress HENT: NCAT, OP clear, neck supple without masses Eyes: PERRL, EOMi Lymph: no cervical lymphadenopathy PULM: CTA B, normal effort CV: RRR, distant heart sounds, no mgr, no JVD GI: BS+, soft, nontender, no hsm Derm: no rash or skin breakdown, bilateral lower extremities wrapped for edema treatment MSK: normal bulk and tone Neuro: A&Ox4, CN II-XII intact, strength 5/5 in all 4 extremities Psyche: normal mood and affect   February 2017 echocardiogram showed an LVEF of 55-60% with normal wall motion, mildly calcified aortic valve, mild mitral regurg, mildly reduced systolic right ventricular function with an RVSP of 55 mmHg Cardiology records reviewed from March 2017 where he was evaluated for his A. fib and pulmonary hypertension. February 2017 CT angiogram chest images personally reviewed showing a saber shaped trachea, 3.9 mm right lower lobe pulmonary nodule, and bilateral pulmonary emboli with significant clot burden and RV strain pattern. April 2017  pulmonary function testing ratio 81%, FEV1 2.91 L (78% predicted), FVC 3.57 L (72% predicted), total lung capacity 6.51 (89% predicted), DLCO 25.18 (73% predicted).     Assessment & Plan:  Pulmonary hypertension ([place]) Jebb was found to have pulmonary hypertension on an echocardiogram after an acute (and large) pulmonary embolism in February 2017.  It is unclear if he had preceding pulmonary hypertension because he never had an echocardiogram for [place] to review prior to this episode. He is at high risk for having pulmonary hypertension from what I presume to be obstructive sleep apnea considering his morbid obesity.  So in summary, it's difficult to say how much of the pulmonary hypertension seen on his echocardiogram in February 2017 was acute versus chronic. It should be noted that he has never had a confirmatory right heart catheterization,  but at this point I don't see a role for that unless the pulmonary hypertension persists.  Plan: Check overnight oximetry test Repeat echocardiogram in August 2017 and follow-up with me after that If he still has evidence of pulmonary hypertension on that echocardiogram then we will get a VQ scan to evaluate for chronic thromboembolic disease and consider a right heart catheterization and further workup for other causes of pulmonary hypertension. Follow-up August 2017  PE (pulmonary embolism) He had a provoked pulmonary embolism in February 2017 after hospitalization for septic shock in January 2017.  See discussion above regarding the effect on his right ventricle.  Plan: Continue anticoagulation through August 2017. In all likelihood, we will be able to stop anticoagulation in August, but I want to see him first  Atrial fibrillation [facility]) Today on exam he appears to be in sinus rhythm.  Given his obesity and daytime somnolence I think it's very reasonable to evaluate him for obstructive sleep apnea as a potential cause for both his pulmonary  hypertension and his atrial fibrillation.  Plan: Continue follow-up with Dr. Oval Linsey Consider stopping anticoagulation and amiodarone if he remains in sinus rhythm by August 2017, obviously we will coordinate any changes in his medications with cardiology Continue plans for polysomnogram  Solitary pulmonary nodule This was a small nodule which considering the fact that he has never smoked his overall low risk.  No further imaging indicated at this time.     Current outpatient prescriptions:  .  amiodarone (PACERONE) 200 MG tablet, Take 1 tablet (200 mg total) by mouth daily., Disp: 90 tablet, Rfl: 1 .  FLUoxetine (PROZAC) 20 MG capsule, Take 1 capsule (20 mg total) by mouth daily., Disp: 90 capsule, Rfl: 1 .  furosemide (LASIX) 20 MG tablet, Take 3 tablets (60 mg total) by mouth daily. (Patient taking differently: Take 40 mg by mouth 2 (two) times daily. ), Disp: 30 tablet, Rfl: 0 .  HYDROcodone-acetaminophen (NORCO) 5-325 MG tablet, Take 1-2 tablets by mouth every 4 (four) hours as needed., Disp: 40 tablet, Rfl: 0 .  potassium chloride (K-DUR) 10 MEQ tablet, Take 2 tablets (20 mEq total) by mouth daily., Disp: 30 tablet, Rfl: 0 .  traZODone (DESYREL) 50 MG tablet, Take 1 tablet (50 mg total) by mouth at bedtime., Disp: 30 tablet, Rfl: 1 .  warfarin (COUMADIN) 5 MG tablet, Take 1 tablet (5 mg total) by mouth daily at 6 PM. Start from Saturday 2/25 (Patient taking differently: Take 5 mg by mouth daily at 6 PM. 5mg  qod, alternating with 7.5mg  qod.), Disp: 30 tablet, Rfl: 0

## 2015-09-01 NOTE — Assessment & Plan Note (Addendum)
Scott Shelton was found to have pulmonary hypertension on an echocardiogram after an acute (and large) pulmonary embolism in February 2017.  It is unclear if he had preceding pulmonary hypertension because he never had an echocardiogram for [place] to review prior to this episode. He is at high risk for having pulmonary hypertension from what I presume to be obstructive sleep apnea considering his morbid obesity.  So in summary, it's difficult to say how much of the pulmonary hypertension seen on his echocardiogram in February 2017 was acute versus chronic. It should be noted that he has never had a confirmatory right heart catheterization, but at this point I don't see a role for that unless the pulmonary hypertension persists.  Plan: Check overnight oximetry test Repeat echocardiogram in August 2017 and follow-up with me after that If he still has evidence of pulmonary hypertension on that echocardiogram then we will get a VQ scan to evaluate for chronic thromboembolic disease and consider a right heart catheterization and further workup for other causes of pulmonary hypertension. Follow-up August 2017

## 2015-09-01 NOTE — Assessment & Plan Note (Signed)
This was a small nodule which considering the fact that he has never smoked his overall low risk.  No further imaging indicated at this time.

## 2015-09-01 NOTE — Patient Instructions (Signed)
We will arrange for an overnight oximetry test at your nursing home We will arrange for an echocardiogram in August before you see me next I will see you back in August 2017

## 2015-09-01 NOTE — Assessment & Plan Note (Signed)
He had a provoked pulmonary embolism in February 2017 after hospitalization for septic shock in January 2017.  See discussion above regarding the effect on his right ventricle.  Plan: Continue anticoagulation through August 2017. In all likelihood, we will be able to stop anticoagulation in August, but I want to see him first

## 2015-09-06 ENCOUNTER — Ambulatory Visit: Payer: Self-pay | Admitting: Occupational Therapy

## 2015-09-08 ENCOUNTER — Ambulatory Visit: Payer: Self-pay | Admitting: Occupational Therapy

## 2015-09-08 ENCOUNTER — Telehealth: Payer: Self-pay | Admitting: Pulmonary Disease

## 2015-09-08 NOTE — Telephone Encounter (Signed)
I have been working on trying to get the patient set up @[facility]  for the ONO that Dr. Lake Bells ordered 09/01/15.  I spoke with a couple of people at the facility but no one could advise how or who I would contact to handle scheduling an ONO at [facility].  I spoke with the patient and let him know I was still trying to get this taken care of.  Patient stated he would talk with his Nurse.  Pt called me back stating he was told that his PCP for the facility, Dr. Lysle Rubens would have to be contacted to order the ONO.  I called & spoke with Elmyra Ricks at Dr. Raliegh Ip. Husain's office.  She stated that [facility] had a specific person assigned to take care of these matters.  Elmyra Ricks stated their office did not house any Medical Records for the pts at [facility].  Elmyra Ricks took pt name, DOB, test being ordered and Dr. Anastasia Pall name as well as my name & contact info.  She stated she would pass this directly to Dr. Lysle Rubens. I did explain to [place] that I had been trying  to find out who at [facility] would handle this so I could fax the Order to that person without any success.  Elmyra Ricks said she would make Dr. Lysle Rubens aware of that as well. Shortly thereafter, the pt called me stating he had been told by someone at [facility] that Dr. Lysle Rubens had ordered the ONO test to be done.

## 2015-09-08 NOTE — Telephone Encounter (Signed)
noted 

## 2015-09-11 ENCOUNTER — Ambulatory Visit: Payer: Self-pay | Admitting: Occupational Therapy

## 2015-09-13 ENCOUNTER — Ambulatory Visit: Payer: Self-pay | Admitting: Occupational Therapy

## 2015-09-15 ENCOUNTER — Ambulatory Visit: Payer: Self-pay | Admitting: Occupational Therapy

## 2015-09-18 ENCOUNTER — Ambulatory Visit: Payer: Self-pay | Admitting: Occupational Therapy

## 2015-09-19 ENCOUNTER — Encounter: Payer: Self-pay | Admitting: Cardiovascular Disease

## 2015-09-19 NOTE — Progress Notes (Signed)
This encounter was created in error - please disregard.

## 2015-09-20 ENCOUNTER — Ambulatory Visit: Payer: Self-pay | Admitting: Occupational Therapy

## 2015-09-22 ENCOUNTER — Ambulatory Visit: Payer: Self-pay | Admitting: Occupational Therapy

## 2015-10-20 ENCOUNTER — Telehealth: Payer: Self-pay | Admitting: Cardiovascular Disease

## 2015-10-20 NOTE — Telephone Encounter (Signed)
Spoke with patient. He had sleep study done @ [facility]. He wanted to know if Dr. Oval Linsey had the report of this study. Informed him I do not see anything scanned in to our computer system regarding this test. Advised he request [facility] to fax results.   Message routed to [place], [place] as Juluis Rainier

## 2015-10-20 NOTE — Telephone Encounter (Signed)
New message     The pt is wanting to the results of his sleep study.

## 2015-10-23 ENCOUNTER — Encounter (HOSPITAL_BASED_OUTPATIENT_CLINIC_OR_DEPARTMENT_OTHER): Payer: Self-pay

## 2015-10-25 NOTE — Telephone Encounter (Signed)
Sleep study received and in Dr Blenda Mounts papers to review

## 2015-10-27 ENCOUNTER — Encounter: Payer: Self-pay | Admitting: Cardiovascular Disease

## 2015-10-27 ENCOUNTER — Encounter: Payer: Self-pay | Admitting: Pulmonary Disease

## 2015-10-27 ENCOUNTER — Ambulatory Visit (INDEPENDENT_AMBULATORY_CARE_PROVIDER_SITE_OTHER): Payer: Self-pay | Admitting: Cardiovascular Disease

## 2015-10-27 VITALS — BP 120/65 | HR 59 | Ht 72.0 in | Wt 317.2 lb

## 2015-10-27 DIAGNOSIS — I480 Paroxysmal atrial fibrillation: Secondary | ICD-10-CM

## 2015-10-27 DIAGNOSIS — G4733 Obstructive sleep apnea (adult) (pediatric): Secondary | ICD-10-CM | POA: Insufficient documentation

## 2015-10-27 DIAGNOSIS — I10 Essential (primary) hypertension: Secondary | ICD-10-CM

## 2015-10-27 NOTE — Patient Instructions (Signed)
Your physician wants you to follow-up in: 6 months with Dr. Oval Linsey. You will receive a reminder letter in the mail two months in advance. If you don't receive a letter, please call our office to schedule the follow-up appointment.   If you need a refill on your cardiac medications before your next appointment, please call your pharmacy.

## 2015-10-27 NOTE — Progress Notes (Addendum)
Cardiology Office Note   Date:  10/27/2015   ID:  Scott Shelton, DOB [birth year], MRN [id number]  PCP:  Pcp Not In System  Cardiologist:   Skeet Latch, MD  Pulmonologist: Dr. Lake Bells  Chief Complaint  Patient presents with  . Follow-up    patient here to discuss afib medication.     History of Present Illness: Scott Shelton is a 65 y.o. male with paroxysmal atrial fibrillation, PE, pulmonary hypertension, melanoma s/p excision, and GERD who presents for follow-up.  Scott Shelton was hospitalized February 2016 with atrial fibrillation.  He developed septic shock likely due to lower external any cellulitis. He then had an episode of syncope and was brought to the emergency department via EMS. He was found to have bilateral pulmonary emboli.  During that hospitalization he had intermittent episodes of atrial fibrillation. He was anticoagulated with warfarin.  Echo revealed normal systolic function with a PA systolic pressure of 57 mmHg.  he was discharged to [facility] for rehabilitation and lymphedema treatments. He was referred for a sleep study due to snoring, atrial fibrillation, and pulmonary hypertension. This study revealed moderate sleep-disordered breathing. It was recommended that he consider CPAP or autoPap.  He is unsure whether he will be able to afford the machine.  Since his last appointment he was evaluated by Dr. Lake Bells for pulmonary hypertension and pulmonary embolism.  The plan is for him to continue anticoagulation through August. He has been feeling well. He denies any chest pain or shortness of breath. He continues to get Unna boot wrap his legs and the swelling has improved significantly. He denies orthopnea or PND.  He wants to remain at [facility] so that he can continue to receive these dressings. He is unsure how much longer he will be allowed to stay there. He gets a lot walking around the facility but does not have any formal exercise. He denies exertional  symptoms.  Scott Shelton BP was monitored at the facility as instructed and his blood pressure was elevated so he was started on carvedilol. Since making that change it has been in the 130s/70-80s.     Past Medical History  Diagnosis Date  . GERD (gastroesophageal reflux disease)   . A-fib ([place])   . Hx pulmonary embolism     with hypoxic respiratory failure  . Chronic kidney disease     STAGE III  . History of cellulitis 05/21/15    BLE  . Pulmonary hypertension ([place])   . Bilateral leg edema     Past Surgical History  Procedure Laterality Date  . Cataract extraction    . Melanoma excision Left 08/17/2015    Procedure: WIDE EXCISION MELANOMA  OF THE BACK ;  Surgeon: [name], MD;  Location: [place];  Service: General;  Laterality: Left;     Current Outpatient Prescriptions  Medication Sig Dispense Refill  . amiodarone (PACERONE) 200 MG tablet Take 1 tablet (200 mg total) by mouth daily. 90 tablet 1  . FLUoxetine (PROZAC) 20 MG capsule Take 1 capsule (20 mg total) by mouth daily. 90 capsule 1  . furosemide (LASIX) 40 MG tablet Take 40 mg by mouth 2 (two) times daily.    Marland Kitchen HYDROcodone-acetaminophen (NORCO) 5-325 MG tablet Take 1-2 tablets by mouth every 4 (four) hours as needed. 40 tablet 0  . potassium chloride (K-DUR) 10 MEQ tablet Take 2 tablets (20 mEq total) by mouth daily. 30 tablet 0  . traZODone (DESYREL) 50 MG tablet Take 1 tablet (  50 mg total) by mouth at bedtime. 30 tablet 1  . warfarin (COUMADIN) 5 MG tablet Take 1 tablet (5 mg total) by mouth daily at 6 PM. Start from Saturday 2/25 (Patient taking differently: Take 5 mg by mouth daily at 6 PM. 5mg  qod, alternating with 7.5mg  qod.) 30 tablet 0   No current facility-administered medications for this visit.    Allergies:   Review of patient's allergies indicates no known allergies.    Social History:  The patient  reports that he has never smoked. He has never used smokeless tobacco. He reports  that he does not drink alcohol or use illicit drugs.   Family History:  The patient's family history includes Cancer in his mother.    ROS:  Please see the history of present illness.   Otherwise, review of systems are positive for none.   All other systems are reviewed and negative.    PHYSICAL EXAM: VS:  BP 120/65 mmHg  Pulse 59  Ht 6' (1.829 m)  Wt 317 lb 3.2 oz (143.881 kg)  BMI 43.01 kg/m2 , BMI Body mass index is 43.01 kg/(m^2). GENERAL:  Well-appearing HEENT:  Pupils equal round and reactive, fundi not visualized, oral mucosa unremarkable NECK:  No jugular venous distention, waveform within normal limits, carotid upstroke brisk and symmetric, no bruits, no thyromegaly LYMPHATICS:  No cervical adenopathy LUNGS:  Clear to auscultation bilaterally HEART:  RRR.  PMI not displaced or sustained,S1 and S2 within normal limits, no S3, no S4, no clicks, no rubs, no murmurs ABD:  Flat, positive bowel sounds normal in frequency in pitch, no bruits, no rebound, no guarding, no midline pulsatile mass, no hepatomegaly, no splenomegaly EXT:  2 plus pulses throughout, no cyanosis no clubbing.  Bilateral lymphedema with compression wrapping.  SKIN:  No rashes no nodules NEURO:  Cranial nerves II through XII grossly intact, motor grossly intact throughout PSYCH:  Cognitively intact, oriented to person place and time   EKG:  EKG is ordered today. 10/27/15: Sinus bradycardia. Rate 59 bpm. Nonspecific T wave abnormalities.  Echo 05/29/15: Study Conclusions  - Left ventricle: The cavity size was normal. There was mild  concentric hypertrophy. Systolic function was normal. The  estimated ejection fraction was in the range of 55% to 60%. Wall  motion was normal; there were no regional wall motion  abnormalities. - Aortic valve: Trileaflet; mildly thickened, mildly calcified  leaflets. - Mitral valve: Calcified annulus. There was mild regurgitation. - Right ventricle: Systolic function was  mildly reduced. - Pulmonic valve: There was trivial regurgitation. - Pulmonary arteries: PA peak pressure: 57 mm Hg (S).  Impressions:  - The right ventricular systolic pressure was increased consistent  with moderate pulmonary hypertension.   Recent Labs: 05/12/2015: TSH 1.293 05/27/2015: ALT 27; B Natriuretic Peptide 140.4* 05/30/2015: Magnesium 2.2 06/01/2015: Hemoglobin 8.9*; Platelets 193 06/02/2015: BUN 10; Creatinine, Ser 1.30*; Potassium 3.1*; Sodium 140    Lipid Panel    Component Value Date/Time   CHOL 121 05/09/2015 1450   TRIG 142 05/09/2015 1450   HDL 25* 05/09/2015 1450   CHOLHDL 4.8 05/09/2015 1450   VLDL 28 05/09/2015 1450   LDLCALC 68 05/09/2015 1450      Wt Readings from Last 3 Encounters:  10/27/15 317 lb 3.2 oz (143.881 kg)  09/01/15 310 lb (140.615 kg)  08/17/15 332 lb (150.594 kg)     ASSESSMENT AND PLAN:  # Atrial fibrillation: Mr. Pang does not appear to be in atrial fibrillation at this time.  It is possible that the atrial fibrillation was precipiated by the pulmonary embolism.  He was started on amiodarone because his blood pressure at the time could not tolerate nodal agents.   He was referred for sleep study and was found to have obstructive sleep apnea. Hopefully he will start on a CPAP machine and we will stop the amiodarone at the next appointment. Continue warfarin for anticoagulation and carvedilol.  This patients CHA2DS2-VASc Score and unadjusted Ischemic Stroke Rate (% per year) is equal to 0.6 % stroke rate/year from a score of 1 Above score calculated as 1 point each if present [CHF, HTN, DM, Vascular=MI/PAD/Aortic Plaque, Age if 65-74, or Male] Above score calculated as 2 points each if present [Age > 75, or Stroke/TIA/TE]  # Elevated blood pressure: Mr. Droge blood pressure is well-controlled on carvedilol.  # Pulmonary hypertension: Mr. Mclain pulmonary pressures were moderate to severely elevated on echo.  This was likely  due to his acute PE, but he has now also been diagnosed with OSA.  We will forward these results to our sleep medicine specialist, Dr. Claiborne Billings for CPAP fitting.    Current medicines are reviewed at length with the patient today.  The patient does not have concerns regarding medicines.  The following changes have been made:  no change  Labs/ tests ordered today include:   Orders Placed This Encounter  Procedures  . EKG 12-Lead     Disposition:   FU with Jeraldin Fesler C. Oval Linsey, MD, [facility] in 6 months.   This note was written with the assistance of speech recognition software.  Please excuse any transcriptional errors.  Signed, Nakhia Levitan C. Oval Linsey, MD, [facility]  10/27/2015 12:59 PM    [place]

## 2015-11-02 ENCOUNTER — Telehealth: Payer: Self-pay | Admitting: Pulmonary Disease

## 2015-11-02 NOTE — Telephone Encounter (Signed)
Spoke with pt and he was confused about Echo vs EKG. Pt had EKG yesterday at cards office. Explained to pt that Echo he is scheduled for on 11/07/15 is a different test than the EKG he had yesterday and that they have different results. Pt voiced understanding and states he will keep Echo appt. Advised we will call him once BQ results Echo. Nothing further needed.

## 2015-11-07 ENCOUNTER — Other Ambulatory Visit (HOSPITAL_COMMUNITY): Payer: Self-pay

## 2015-11-07 ENCOUNTER — Ambulatory Visit (HOSPITAL_COMMUNITY): Payer: Self-pay | Attending: Pulmonary Disease

## 2015-11-07 DIAGNOSIS — I270 Primary pulmonary hypertension: Secondary | ICD-10-CM | POA: Insufficient documentation

## 2015-11-07 DIAGNOSIS — N189 Chronic kidney disease, unspecified: Secondary | ICD-10-CM | POA: Insufficient documentation

## 2015-11-07 DIAGNOSIS — R0600 Dyspnea, unspecified: Secondary | ICD-10-CM | POA: Insufficient documentation

## 2015-11-07 DIAGNOSIS — I517 Cardiomegaly: Secondary | ICD-10-CM | POA: Insufficient documentation

## 2015-11-07 DIAGNOSIS — I071 Rheumatic tricuspid insufficiency: Secondary | ICD-10-CM | POA: Insufficient documentation

## 2015-11-07 DIAGNOSIS — I340 Nonrheumatic mitral (valve) insufficiency: Secondary | ICD-10-CM | POA: Insufficient documentation

## 2015-11-10 ENCOUNTER — Telehealth: Payer: Self-pay

## 2015-11-10 NOTE — Telephone Encounter (Signed)
This Case Manager received call from patient who indicated he was being discharged from [facility] and is needing a hospital follow-up appointment. Patient indicated he did not have a PCP. This Case Manager offered appointment on 11/13/15 at 0900; however, patient indicated he is on Coumadin and will not be due for an INR check at that time. Patient scheduled an appointment on 11/16/15 at 0930 with Dr. Jarold Song. Patient verbalized understanding and appreciative of appointment.

## 2015-11-13 ENCOUNTER — Inpatient Hospital Stay: Payer: Self-pay

## 2015-11-14 ENCOUNTER — Telehealth: Payer: Self-pay

## 2015-11-14 NOTE — Telephone Encounter (Signed)
Transitional Care Clinic Post-discharge Follow-Up Phone Call:  The patient was discharged from [facility] after receiving rehab and lymphedema treatments. He has 3 hospitalizations since 04/2015. Discussed his status with Dr Jarold Song and is eligible for the [facility].   Date of Discharge: 11/11/15 from [facility]  Principal Discharge Diagnosis(es): atrial fibrillation, pulmonary emboli, lymphedema, pulmonary hypertension Post-discharge Communication: (Clearly document all attempts clearly and date contact made) Call placed to the patient Call Completed: Yes                   With Whom: Patient  Interpreter Needed: No     Please check all that apply:  X Patient is knowledgeable of his/her condition(s) and/or treatment. X Patient is caring for self at home. He said that he had been the primary care giver for his father but he is no longer able to care for him. He noted that his father is now at [facility] but may be staying there long term. The patient is living alone.  He said that he has 2 brothers who can provide some help to him but he has been trying to do things by himself.  He said that he has his groceries delivered.  He reported that he is feeling " much better" and his lymphedema is " come a ling way."  ? Patient is receiving assist at home from family and/or caregiver. Family and/or caregiver is knowledgeable of patient's condition(s) and/or treatment. ? Patient is receiving home health services. If so, name of agency.     Medication Reconciliation:  X  Medication list reviewed with patient.- The recent medication list from the cardiology appointment on 10/27/15 was reviewed. This CM did not have a list of discharge medications from [facility]  X  Patient obtained all discharge medications. If not, why? He confirmed that he has all medications and has been taking them as ordered He said that he will need refills for some of the medications but has enough until he  sees Dr Jarold Song on 11/16/15.  Instructed him to bring all of his medications to his appointment for Dr Jarold Song to review. He said that he understands that he needs to have his INR checked at his appointment. He denied any signs of bleeding.  Explained to him about the [facility] assistance resources. He said that he could use some help with paying for his medications as he does not have any insurance.    Activities of Daily Living:  X  Independent - he said that he has a rollator or walker to use if needed.  He noted that he is waiting for the results of his sleep study that was done at [facility] as he believes that he needs a CPAP. He stated that Dr Oval Linsey has been following up with the sleep study.  ? Needs assist (describe; ? home DME used) ? [employer] (describe, ? home DME used)   Community resources in place for patient:  X  None  - he said that he has compression stockings on both legs and he is able to independently manage taking them on/off. He said that a friend of his, who is a Marine scientist, will be coming to see him weekly to check on his legs/lymphedema.  No home care was ordered.  ? Home Health/Home DME ? Assisted Living ? Support Group          Patient Education: Explained the services provide at the [facility] including pharmacy assistance and financial counseling as well  as primary care. He said that he does not have a PCP at this time. He noted that he has [employer] and can uses that for transportation to his medical appointments. He stated that he has applied for medicaid and medicare disability and is waiting for a decision about his eligibility  He does not receive food stamps and does not have insurance at this time. Explained the purpose of the [employer] and [employer].         Questions/Concerns discussed: Confirmed his appointment for 11/16/15 @ 0930 and he said that he has transportation to the clinic.

## 2015-11-15 ENCOUNTER — Telehealth: Payer: Self-pay

## 2015-11-15 NOTE — Telephone Encounter (Signed)
Call placed to the patient and reminded him of his appointment tomorrow at the [facility] at 0930. He said the he thought it was at 1000 and will need to re-schedule his [employer] transportation.  Reminded him to bring his medications with him to the appointment and he stated that he would.

## 2015-11-16 ENCOUNTER — Encounter: Payer: Self-pay | Admitting: Family Medicine

## 2015-11-16 ENCOUNTER — Ambulatory Visit: Payer: Self-pay | Attending: Family Medicine | Admitting: Family Medicine

## 2015-11-16 VITALS — BP 106/65 | HR 64 | Temp 98.0°F | Ht 68.5 in | Wt 328.0 lb

## 2015-11-16 DIAGNOSIS — G4730 Sleep apnea, unspecified: Secondary | ICD-10-CM | POA: Insufficient documentation

## 2015-11-16 DIAGNOSIS — I272 Other secondary pulmonary hypertension: Secondary | ICD-10-CM

## 2015-11-16 DIAGNOSIS — I480 Paroxysmal atrial fibrillation: Secondary | ICD-10-CM

## 2015-11-16 DIAGNOSIS — G4733 Obstructive sleep apnea (adult) (pediatric): Secondary | ICD-10-CM

## 2015-11-16 DIAGNOSIS — I2699 Other pulmonary embolism without acute cor pulmonale: Secondary | ICD-10-CM

## 2015-11-16 DIAGNOSIS — M7989 Other specified soft tissue disorders: Secondary | ICD-10-CM | POA: Insufficient documentation

## 2015-11-16 LAB — POCT INR: INR: 1.2

## 2015-11-16 MED ORDER — FLUOXETINE HCL 20 MG PO CAPS: 20.0000 mg | ORAL_CAPSULE | Freq: Every day | ORAL | 3 refills | Status: DC

## 2015-11-16 MED ORDER — POTASSIUM CHLORIDE ER 10 MEQ PO TBCR: 20.0000 meq | EXTENDED_RELEASE_TABLET | Freq: Every day | ORAL | 3 refills | Status: DC

## 2015-11-16 MED ORDER — FUROSEMIDE 40 MG PO TABS: 40.0000 mg | ORAL_TABLET | Freq: Two times a day (BID) | ORAL | 3 refills | Status: DC

## 2015-11-16 MED ORDER — WARFARIN SODIUM 5 MG PO TABS: ORAL_TABLET | ORAL | 0 refills | Status: DC

## 2015-11-16 MED ORDER — TRAZODONE HCL 50 MG PO TABS: 50.0000 mg | ORAL_TABLET | Freq: Every day | ORAL | 3 refills | Status: DC

## 2015-11-16 MED FILL — FUROSEMIDE 20 MG TABLET: 20 | 30 days supply | Qty: 120 | Fill #0

## 2015-11-16 MED FILL — traZODone HCL 50 MG TABS: 50 | 30 days supply | Qty: 30 | Fill #0

## 2015-11-16 MED FILL — FLUoxetine HCL 20 MG CAPS: 20 | 30 days supply | Qty: 30 | Fill #0

## 2015-11-16 MED FILL — POTASSIUM CL 10 MEQ TAB SA: 10 | 30 days supply | Qty: 60 | Fill #0

## 2015-11-16 NOTE — Progress Notes (Signed)
Transitional care clinic  Date of telephone encounter: 11/14/15  Hospitalization date: 06/02/15 through 11/11/15  Subjective:  Patient ID: Scott Shelton, male    DOB: 1951/02/27  Age: 65 y.o. MRN: [id number]  CC: Follow-up (pulomonary embolism); Hypertension; and lymphedema   HPI Scott Shelton is a 65 year old male with a history of paroxysmal atrial fibrillation, obstructive sleep apnea, lymphedema, pulmonary embolism, pulmonary hypertension, obstructive sleep apnea melanoma status post excision who comes in to establish care at the transitional care clinic after a recent discharge from [facility].  During his hospitalization in 05/2015 he was hospitalized with septic shock secondary to lower extremity cellulitis and was also diagnosed with pulmonary embolism at which time he was commenced on anticoagulation with Coumadin. His last visit to his cardiologist (Dr Skeet Latch) was in 10/2015 and he is not scheduled for follow-up until 6 months from now. He did have a sleep study which revealed moderate sleep apnea and he informs me his cardiologist had said once results are retrieved she would work with him regarding obtaining CPAP machine.  He continues to have bilateral lower extremity edema and is using compression stockings for this.  Past Medical History:  Diagnosis Date  . A-fib ([place])   . Bilateral leg edema   . Chronic kidney disease    STAGE III  . GERD (gastroesophageal reflux disease)   . History of cellulitis 05/21/15   BLE  . Hx pulmonary embolism    with hypoxic respiratory failure  . Pulmonary hypertension ([place])     Past Surgical History:  Procedure Laterality Date  . CATARACT EXTRACTION    . MELANOMA EXCISION Left 08/17/2015   Procedure: WIDE EXCISION MELANOMA  OF THE BACK ;  Surgeon: [name], MD;  Location: [facility];  Service: General;  Laterality: Left;      Outpatient Medications Prior to Visit  Medication Sig Dispense  Refill  . amiodarone (PACERONE) 200 MG tablet Take 1 tablet (200 mg total) by mouth daily. 90 tablet 1  . FLUoxetine (PROZAC) 20 MG capsule Take 1 capsule (20 mg total) by mouth daily. 90 capsule 1  . furosemide (LASIX) 40 MG tablet Take 40 mg by mouth 2 (two) times daily.    . potassium chloride (K-DUR) 10 MEQ tablet Take 2 tablets (20 mEq total) by mouth daily. 30 tablet 0  . traZODone (DESYREL) 50 MG tablet Take 1 tablet (50 mg total) by mouth at bedtime. 30 tablet 1  . warfarin (COUMADIN) 5 MG tablet Take 1 tablet (5 mg total) by mouth daily at 6 PM. Start from Saturday 2/25 (Patient taking differently: Take 5 mg by mouth daily at 6 PM. 5mg  qod, alternating with 7.5mg  qod.) 30 tablet 0  . HYDROcodone-acetaminophen (NORCO) 5-325 MG tablet Take 1-2 tablets by mouth every 4 (four) hours as needed. (Patient not taking: Reported on 11/16/2015) 40 tablet 0   No facility-administered medications prior to visit.     ROS Review of Systems  Constitutional: Negative for activity change and appetite change.  HENT: Negative for sinus pressure and sore throat.   Eyes: Negative for visual disturbance.  Respiratory: Negative for cough, chest tightness and shortness of breath.   Cardiovascular: Positive for leg swelling. Negative for chest pain.  Gastrointestinal: Negative for abdominal distention, abdominal pain, constipation and diarrhea.  Endocrine: Negative.   Genitourinary: Negative for dysuria.  Musculoskeletal: Negative for joint swelling and myalgias.  Skin: Negative for rash.  Allergic/Immunologic: Negative.   Neurological: Negative for weakness, light-headedness  and numbness.  Psychiatric/Behavioral: Negative for dysphoric mood and suicidal ideas.    Objective:  BP 106/65 (BP Location: Right Arm, Patient Position: Sitting, Cuff Size: Large)   Pulse 64   Temp 98 F (36.7 C) (Oral)   Ht 5' 8.5" (1.74 m)   Wt (!) 328 lb (148.8 kg)   SpO2 95%   BMI 49.15 kg/m   BP/Weight 11/16/2015  10/27/2015 [id number]  Systolic BP [id number]  Diastolic BP 65 65 68  Wt. (Lbs) 328 317.2 310  BMI 49.15 43.01 42.03      Physical Exam  Constitutional: He is oriented to person, place, and time. He appears well-developed and well-nourished.  Morbidly obese  Cardiovascular: Normal rate and normal heart sounds.   No murmur heard. Unable to palpate dorsalis pedis bilaterally due to edema  Pulmonary/Chest: Effort normal and breath sounds normal. He has no wheezes. He has no rales. He exhibits no tenderness.  Abdominal: Soft. Bowel sounds are normal. He exhibits no distension and no mass. There is no tenderness.  Musculoskeletal: Normal range of motion. He exhibits edema (bilateral lymphedema).  Neurological: He is alert and oriented to person, place, and time.     Assessment & Plan:   1. Paroxysmal atrial fibrillation (HCC) Currently in sinus rhythm Continue amiodarone-he has been advised to obtain refills from cardiology We'll screen for thyroid disorder with a TSH given he is on amiodarone; had PFT earlier in the year which revealed emphysema (he is asymptomatic) - COMPLETE METABOLIC PANEL WITH GFR; Future - TSH; Future - Lipid panel; Future - INR  2. PE (pulmonary embolism) INR is subtherapeutic at 1.2 Increase Coumadin dose to 7.5 mg every day except on Monday and Wednesdays when he will take 5 mg Duration of anticoagulation for PE along will be up until 11/2015 however given presence of A. fib I will defer this to cardiology to determine if he will need lifelong anticoagulation - COMPLETE METABOLIC PANEL WITH GFR; Future - TSH; Future - Lipid panel; Future - INR  3. Morbid obesity due to excess calories (HCC) Reduce caloric intake, increase physical activity as tolerated - COMPLETE METABOLIC PANEL WITH GFR; Future - TSH; Future - Lipid panel; Future - INR  4. OSA (obstructive sleep apnea) Focal need CPAP machine. He will be getting in touch with his cardiologist  whom he said had told him she would help work this out - [place] GFR; Future - TSH; Future - Lipid panel; Future - INR  5. Pulmonary hypertension ([place]) Stable Managed by pulmonary - COMPLETE METABOLIC PANEL WITH GFR; Future - TSH; Future - Lipid panel; Future - INR   Meds ordered this encounter  Medications  . warfarin (COUMADIN) 5 MG tablet    Sig: 7.5 mg daily except on Mondays and Wednesdays when you will take 5 mg.    Dispense:  30 tablet    Refill:  0  . traZODone (DESYREL) 50 MG tablet    Sig: Take 1 tablet (50 mg total) by mouth at bedtime.    Dispense:  30 tablet    Refill:  3  . potassium chloride (K-DUR) 10 MEQ tablet    Sig: Take 2 tablets (20 mEq total) by mouth daily.    Dispense:  60 tablet    Refill:  3  . furosemide (LASIX) 40 MG tablet    Sig: Take 1 tablet (40 mg total) by mouth 2 (two) times daily.    Dispense:  30 tablet  Refill:  3  . FLUoxetine (PROZAC) 20 MG capsule    Sig: Take 1 capsule (20 mg total) by mouth daily.    Dispense:  30 capsule    Refill:  3    Follow-up: Return in about 2 weeks (around 11/30/2015) for [facility]CC- follow up on lymphedema.   Arnoldo Morale MD

## 2015-11-16 NOTE — Patient Instructions (Signed)
Sleep Apnea  Sleep apnea is a sleep disorder characterized by abnormal pauses in breathing while you sleep. When your breathing pauses, the level of oxygen in your blood decreases. This causes you to move out of deep sleep and into light sleep. As a result, your quality of sleep is poor, and the system that carries your blood throughout your body (cardiovascular system) experiences stress. If sleep apnea remains untreated, the following conditions can develop:  High blood pressure (hypertension).  Coronary artery disease.  Inability to achieve or maintain an erection (impotence).  Impairment of your thought process (cognitive dysfunction). There are three types of sleep apnea: 1. Obstructive sleep apnea--Pauses in breathing during sleep because of a blocked airway. 2. Central sleep apnea--Pauses in breathing during sleep because the area of the brain that controls your breathing does not send the correct signals to the muscles that control breathing. 3. Mixed sleep apnea--A combination of both obstructive and central sleep apnea. RISK FACTORS The following risk factors can increase your risk of developing sleep apnea:  Being overweight.  Smoking.  Having narrow passages in your nose and throat.  Being of older age.  Being male.  Alcohol use.  Sedative and tranquilizer use.  Ethnicity. Among individuals younger than 35 years, African Americans are at increased risk of sleep apnea. SYMPTOMS   Difficulty staying asleep.  Daytime sleepiness and fatigue.  Loss of energy.  Irritability.  Loud, heavy snoring.  Morning headaches.  Trouble concentrating.  Forgetfulness.  Decreased interest in sex.  Unexplained sleepiness. DIAGNOSIS  In order to diagnose sleep apnea, your caregiver will perform a physical examination. A sleep study done in the comfort of your own home may be appropriate if you are otherwise healthy. Your caregiver may also recommend that you spend the  night in a sleep lab. In the sleep lab, several monitors record information about your heart, lungs, and brain while you sleep. Your leg and arm movements and blood oxygen level are also recorded. TREATMENT The following actions may help to resolve mild sleep apnea:  Sleeping on your side.   Using a decongestant if you have nasal congestion.   Avoiding the use of depressants, including alcohol, sedatives, and narcotics.   Losing weight and modifying your diet if you are overweight. There also are devices and treatments to help open your airway:  Oral appliances. These are custom-made mouthpieces that shift your lower jaw forward and slightly open your bite. This opens your airway.  Devices that create positive airway pressure. This positive pressure "splints" your airway open to help you breathe better during sleep. The following devices create positive airway pressure:  Continuous positive airway pressure (CPAP) device. The CPAP device creates a continuous level of air pressure with an air pump. The air is delivered to your airway through a mask while you sleep. This continuous pressure keeps your airway open.  Nasal expiratory positive airway pressure (EPAP) device. The EPAP device creates positive air pressure as you exhale. The device consists of single-use valves, which are inserted into each nostril and held in place by adhesive. The valves create very little resistance when you inhale but create much more resistance when you exhale. That increased resistance creates the positive airway pressure. This positive pressure while you exhale keeps your airway open, making it easier to breath when you inhale again.  Bilevel positive airway pressure (BPAP) device. The BPAP device is used mainly in patients with central sleep apnea. This device is similar to the CPAP device because   it also uses an air pump to deliver continuous air pressure through a mask. However, with the BPAP machine, the  pressure is set at two different levels. The pressure when you exhale is lower than the pressure when you inhale.  Surgery. Typically, surgery is only done if you cannot comply with less invasive treatments or if the less invasive treatments do not improve your condition. Surgery involves removing excess tissue in your airway to create a wider passage way.   This information is not intended to replace advice given to you by your health care provider. Make sure you discuss any questions you have with your health care provider.   Document Released: 03/15/2002 Document Revised: 04/15/2014 Document Reviewed: 08/01/2011 Elsevier Interactive Patient Education 2016 Elsevier Inc.   

## 2015-11-16 NOTE — Progress Notes (Signed)
Significant swelling bilaterally leg INR checked one week ago Wednesday- 1.1

## 2015-11-20 ENCOUNTER — Telehealth: Payer: Self-pay | Admitting: Cardiovascular Disease

## 2015-11-20 MED ORDER — AMIODARONE HCL 200 MG PO TABS
200.0000 mg | ORAL_TABLET | Freq: Every day | ORAL | 5 refills | Status: DC
Start: 2015-11-20 — End: 2015-11-20

## 2015-11-20 MED ORDER — AMIODARONE HCL 200 MG PO TABS: 200.0000 mg | ORAL_TABLET | Freq: Every day | ORAL | 5 refills | Status: DC

## 2015-11-20 MED FILL — AMIODARONE HCL 200 MG TAB: 200 | 30 days supply | Qty: 30 | Fill #0

## 2015-11-20 NOTE — Telephone Encounter (Signed)
Rx(s) sent to pharmacy electronically.  

## 2015-11-20 NOTE — Telephone Encounter (Signed)
New message   Pt wants a prescription filled but don't know when he is expected to come off medication   *STAT* If patient is at the pharmacy, call can be transferred to refill team.   1. Which medications need to be refilled? (please list name of each medication and dose if known) Pacerone- Amiodarone 250mg   2. Which pharmacy/location (including street and city if local pharmacy) is medication to be sent to?  [facility]  3. Do they need a 30 day or 90 day supply? [place]

## 2015-11-21 ENCOUNTER — Ambulatory Visit: Payer: Self-pay | Attending: Family Medicine

## 2015-11-21 DIAGNOSIS — I272 Other secondary pulmonary hypertension: Secondary | ICD-10-CM | POA: Insufficient documentation

## 2015-11-21 DIAGNOSIS — G4733 Obstructive sleep apnea (adult) (pediatric): Secondary | ICD-10-CM | POA: Insufficient documentation

## 2015-11-21 DIAGNOSIS — I480 Paroxysmal atrial fibrillation: Secondary | ICD-10-CM | POA: Insufficient documentation

## 2015-11-21 DIAGNOSIS — I2699 Other pulmonary embolism without acute cor pulmonale: Secondary | ICD-10-CM | POA: Insufficient documentation

## 2015-11-21 LAB — LIPID PANEL
CHOLESTEROL: 177 mg/dL (ref 125–200)
HDL: 36 mg/dL — ABNORMAL LOW (ref >=40)
LDL Cholesterol: 97 mg/dL (ref <130)
TRIGLYCERIDES: 220 mg/dL — AB (ref <150)
Total CHOL/HDL Ratio: 4.9 Ratio (ref <=5.0)
VLDL: 44 mg/dL — AB (ref <30)

## 2015-11-21 LAB — TSH: TSH: 1.87 m[IU]/L (ref 0.40–4.50)

## 2015-11-21 LAB — COMPLETE METABOLIC PANEL WITH GFR
ALT: 17 U/L (ref 9–46)
AST: 18 U/L (ref 10–35)
Albumin: 3.9 g/dL (ref 3.6–5.1)
Alkaline Phosphatase: 65 U/L (ref 40–115)
BILIRUBIN TOTAL: 0.4 mg/dL (ref 0.2–1.2)
BUN: 25 mg/dL (ref 7–25)
CALCIUM: 9.6 mg/dL (ref 8.6–10.3)
CO2: 26 mmol/L (ref 20–31)
Chloride: 103 mmol/L (ref 98–110)
Creat: 1.2 mg/dL (ref 0.70–1.25)
GFR, EST AFRICAN AMERICAN: 74 mL/min (ref >=60)
GFR, EST NON AFRICAN AMERICAN: 64 mL/min (ref >=60)
Glucose, Bld: 97 mg/dL (ref 65–99)
Potassium: 4.4 mmol/L (ref 3.5–5.3)
Sodium: 137 mmol/L (ref 135–146)
TOTAL PROTEIN: 6.1 g/dL (ref 6.1–8.1)

## 2015-11-22 ENCOUNTER — Ambulatory Visit: Payer: Self-pay | Attending: Internal Medicine

## 2015-11-24 ENCOUNTER — Telehealth: Payer: Self-pay

## 2015-11-24 NOTE — Telephone Encounter (Signed)
This Case Manager placed follow-up phone call to patient. Reminded patient of his upcoming appointment on 11/30/15 at 0930 with Dr. Jarold Song. Patient aware of his upcoming appointment. He uses [employer] for transportation to appointments. Inquired about patient's status. Patient indicated he was "doing fine." Patient questioned if Dr. Jarold Song received his sleep study report, and if Dr. Jarold Song had heard from Dr. Oval Linsey (Cardiologist) regarding whether he needs to continue amiodarone and coumadin.  Will route encounter to Dr. Jarold Song.  Inquired if patient taking medications as prescribed. Patient indicated he has all prescribed medications and is taking them as prescribed. Patient  informed this Case Manager that he was "declared disabled" this week. Patient indicated he has applied for Medicaid, but he does not know status of application. Instructed patient to call Department of Social Services and ask to speak with his Case Worker to determine status of Medicaid application. Patient verbalized understanding and appreciative of information.

## 2015-11-27 NOTE — Telephone Encounter (Signed)
Review of his chart indicates his prescription was done by cardiology on 8/14. I had discussed the findings of his sleep study report with him at his last visit.

## 2015-11-28 ENCOUNTER — Telehealth: Payer: Self-pay

## 2015-11-28 NOTE — Telephone Encounter (Signed)
Call placed to the patient to follow up with his questions about his medications. He said that he is aware that the amiodarone was re-ordered because he requested it.  He also said that he will need to follow up with cardiology again about how long he will need to continue to take the coumadin. He also said that he has not heard anything from the cardiologist who was following up with his sleep study and who he thought was going to help him obtain a CPAP.  He noted that he wanted to make sure that Dr Jarold Song has all of the information about his medications and the sleep study results.  Reminded him that Dr Jarold Song reviewed the sleep study results with him at his last appointment. He stated that he understood but wanted to know the status of the CPAP.  Informed him that Dr Jarold Song is able to access the cardiology notes that are in the electronic medical record.  He said that he would contact cardiology and inquire about the status of the CPAP and request that they ( cardiology)contact Dr Jarold Song with any information that she ( Dr Jarold Song) needs for his appointment with her on 11/30/15.

## 2015-11-29 ENCOUNTER — Telehealth: Payer: Self-pay | Admitting: Cardiovascular Disease

## 2015-11-29 ENCOUNTER — Telehealth: Payer: Self-pay

## 2015-11-29 NOTE — Telephone Encounter (Signed)
Spoke with pt, he has questions regarding his amiodarone, warfarin and sleep study. Discussed with dr Oval Linsey, pt is to continue amiodarone and warfarin until he is seen in follow in January 2018. The sleep study results have been forward to dr Claiborne Billings for him to give the orders for CPAP or other testing that maybe needed.

## 2015-11-29 NOTE — Telephone Encounter (Signed)
New message    Pt verbalized that he needs to speak to the rn because he has some questions that needs to be addressed  He said that Dr.Amad has some "[place] duties" that needs to be preform based off of Dr.Randolphs assessment

## 2015-11-29 NOTE — Telephone Encounter (Signed)
Attempted to contact the patient to remind him of his appointment at the [facility]CC at the [facility] tomorrow, 11/30/15 @ 0930. Call placed to [phone] and a HIPAA compliant voice mail message was left requesting a call back to # [phone] or [phone].

## 2015-11-29 NOTE — Telephone Encounter (Signed)
error 

## 2015-11-30 ENCOUNTER — Telehealth: Payer: Self-pay

## 2015-11-30 ENCOUNTER — Ambulatory Visit: Payer: Self-pay | Attending: Family Medicine | Admitting: Family Medicine

## 2015-11-30 ENCOUNTER — Encounter: Payer: Self-pay | Admitting: Family Medicine

## 2015-11-30 VITALS — BP 124/76 | HR 72 | Temp 98.7°F | Ht 68.0 in | Wt 332.6 lb

## 2015-11-30 DIAGNOSIS — I480 Paroxysmal atrial fibrillation: Secondary | ICD-10-CM | POA: Insufficient documentation

## 2015-11-30 DIAGNOSIS — Z9842 Cataract extraction status, left eye: Secondary | ICD-10-CM | POA: Insufficient documentation

## 2015-11-30 DIAGNOSIS — N183 Chronic kidney disease, stage 3 (moderate): Secondary | ICD-10-CM | POA: Insufficient documentation

## 2015-11-30 DIAGNOSIS — Z6841 Body Mass Index (BMI) 40.0 and over, adult: Secondary | ICD-10-CM | POA: Insufficient documentation

## 2015-11-30 DIAGNOSIS — G4733 Obstructive sleep apnea (adult) (pediatric): Secondary | ICD-10-CM | POA: Insufficient documentation

## 2015-11-30 DIAGNOSIS — I272 Other secondary pulmonary hypertension: Secondary | ICD-10-CM | POA: Insufficient documentation

## 2015-11-30 DIAGNOSIS — I2699 Other pulmonary embolism without acute cor pulmonale: Secondary | ICD-10-CM | POA: Insufficient documentation

## 2015-11-30 DIAGNOSIS — I890 Lymphedema, not elsewhere classified: Secondary | ICD-10-CM | POA: Insufficient documentation

## 2015-11-30 DIAGNOSIS — K219 Gastro-esophageal reflux disease without esophagitis: Secondary | ICD-10-CM | POA: Insufficient documentation

## 2015-11-30 DIAGNOSIS — R600 Localized edema: Secondary | ICD-10-CM | POA: Insufficient documentation

## 2015-11-30 LAB — POCT INR: INR: 2.5

## 2015-11-30 MED ORDER — WARFARIN SODIUM 5 MG PO TABS: ORAL_TABLET | ORAL | 2 refills | Status: DC

## 2015-11-30 MED ORDER — CARVEDILOL 12.5 MG PO TABS: 12.5000 mg | ORAL_TABLET | Freq: Two times a day (BID) | ORAL | 3 refills | Status: DC

## 2015-11-30 MED FILL — WARFARIN SODIUM 5 MG TABLET: 5 | 30 days supply | Qty: 60 | Fill #0

## 2015-11-30 MED FILL — CARVEDILOL 12.5 MG TABLET: 12.5 | 30 days supply | Qty: 60 | Fill #0

## 2015-11-30 NOTE — Telephone Encounter (Signed)
-----   Message from Arnoldo Morale, MD sent at 11/23/2015  9:06 AM EDT ----- Thyroid is normal, total cholesterol is normal but triglycerides are elevated; advised to take OTC fish oil caps

## 2015-11-30 NOTE — Progress Notes (Signed)
Subjective:    Patient ID: Scott Shelton, male    DOB: Apr 01, 1951, 65 y.o.   MRN: [id number]  HPI Scott Shelton is a 65 year old male with a history of paroxysmal atrial fibrillation, obstructive sleep apnea, lymphedema, pulmonary embolism, pulmonary hypertension, obstructive sleep apnea melanoma status post excision who comes in for a follow up visit at the transitional care clinic after a recent discharge from [place] [facility].  During his hospitalization in 05/2015 he was hospitalized with septic shock secondary to lower extremity cellulitis and was also diagnosed with pulmonary embolism at which time he was commenced on anticoagulation with Coumadin. His last visit to his cardiologist (Dr Skeet Latch) was in 10/2015 and he is not scheduled for follow-up until 6 months from now. He did have a sleep study which revealed moderate sleep apnea and he is yet to obtain a CPAP machine.  He continues to have bilateral lower extremity edema and is using compression stockings for this.. He has no other complaints today.  Past Medical History:  Diagnosis Date  . A-fib ([place])   . Bilateral leg edema   . Chronic kidney disease    STAGE III  . GERD (gastroesophageal reflux disease)   . History of cellulitis 05/21/15   BLE  . Hx pulmonary embolism    with hypoxic respiratory failure  . Pulmonary hypertension ([place])     Past Surgical History:  Procedure Laterality Date  . CATARACT EXTRACTION    . MELANOMA EXCISION Left 08/17/2015   Procedure: WIDE EXCISION MELANOMA  OF THE BACK ;  Surgeon: [name], MD;  Location: [facility];  Service: General;  Laterality: Left;    No Known Allergies  Current Outpatient Prescriptions on File Prior to Visit  Medication Sig Dispense Refill  . amiodarone (PACERONE) 200 MG tablet Take 1 tablet (200 mg total) by mouth daily. 30 tablet 5  . FLUoxetine (PROZAC) 20 MG capsule Take 1 capsule (20 mg total) by mouth daily. 30 capsule 3    . furosemide (LASIX) 40 MG tablet Take 1 tablet (40 mg total) by mouth 2 (two) times daily. 30 tablet 3  . potassium chloride (K-DUR) 10 MEQ tablet Take 2 tablets (20 mEq total) by mouth daily. 60 tablet 3  . traZODone (DESYREL) 50 MG tablet Take 1 tablet (50 mg total) by mouth at bedtime. 30 tablet 3  . HYDROcodone-acetaminophen (NORCO) 5-325 MG tablet Take 1-2 tablets by mouth every 4 (four) hours as needed. (Patient not taking: Reported on 11/16/2015) 40 tablet 0   No current facility-administered medications on file prior to visit.      Review of Systems Constitutional: Negative for activity change and appetite change.  HENT: Negative for sinus pressure and sore throat.   Eyes: Negative for visual disturbance.  Respiratory: Negative for cough, chest tightness and shortness of breath.   Cardiovascular: Positive for leg swelling. Negative for chest pain.  Gastrointestinal: Negative for abdominal distention, abdominal pain, constipation and diarrhea.  Endocrine: Negative.   Genitourinary: Negative for dysuria.  Musculoskeletal: Negative for joint swelling and myalgias.  Skin: Negative for rash.  Allergic/Immunologic: Negative.   Neurological: Negative for weakness, light-headedness and numbness.  Psychiatric/Behavioral: Negative for dysphoric mood and suicidal ideas.     Objective: Vitals:   11/30/15 0924  BP: 124/76  Pulse: 72  Temp: 98.7 F (37.1 C)  TempSrc: Oral  SpO2: 97%  Weight: (!) 332 lb 9.6 oz (150.9 kg)  Height: 5\' 8"  (1.727 m)  Physical Exam  Constitutional: He is oriented to person, place, and time. He appears well-developed and well-nourished.  Morbidly obese  Cardiovascular: Normal rate and normal heart sounds.   No murmur heard. Unable to palpate dorsalis pedis bilaterally due to edema  Pulmonary/Chest: Effort normal and breath sounds normal. He has no wheezes. He has no rales. He exhibits no tenderness.  Abdominal: Soft. Bowel sounds are normal. He  exhibits no distension and no mass. There is no tenderness.  Musculoskeletal: Normal range of motion. He exhibits edema (bilateral lymphedema).  Neurological: He is alert and oriented to person, place, and time.        Assessment & Plan:  1. Paroxysmal atrial fibrillation (HCC) Currently in sinus rhythm Continue amiodarone-he has been advised to obtain refills from cardiology TFT normal;  had PFT earlier in the year which revealed emphysema (he is asymptomatic) - INR- therapeutic at 2.5  2. PE (pulmonary embolism) INR is therapeutic at 2.5 Continue Coumadin dose of 7.5 mg every day except on Monday and Wednesdays when he will take 5 mg Duration of anticoagulation for PE alone will be up until 11/2015 however given presence of underlying A. Fib he might need anticoagulation for life ( he is of the opinion that Cardiology had indicated anticoagulation would be short term)  I will defer this to cardiology to determine if he will need lifelong anticoagulation INR in 3 week with CPP  3. Morbid obesity due to excess calories (HCC) Reduce caloric intake, increase physical activity as tolerated  4. OSA (obstructive sleep apnea) Will need CPAP machine however he has no medical coverage He will be seeing his Pulmonologist and I have advised him to discuss this with him as it would have to be ordered via the [employer].  5. Pulmonary hypertension ([place]) Stable Managed by pulmonary  6. Lymphedema Use compression stockings and elevate legs.

## 2015-11-30 NOTE — Telephone Encounter (Signed)
Writer called patient and LVM regarding elevated triglycerides.  Patient advised to take OTC fish oil caps.

## 2015-11-30 NOTE — Telephone Encounter (Signed)
Spoke with pt, aware of dr Blenda Mounts recommendations. He is going to discuss his CPAP with the pulmonary doctor on monday

## 2015-11-30 NOTE — Progress Notes (Signed)
Coumadin? Amiodarone? Medication refill INR?

## 2015-12-04 ENCOUNTER — Encounter: Payer: Self-pay | Admitting: Pulmonary Disease

## 2015-12-04 ENCOUNTER — Ambulatory Visit (INDEPENDENT_AMBULATORY_CARE_PROVIDER_SITE_OTHER): Payer: Self-pay | Admitting: Pulmonary Disease

## 2015-12-04 DIAGNOSIS — I272 Other secondary pulmonary hypertension: Secondary | ICD-10-CM

## 2015-12-04 DIAGNOSIS — I2699 Other pulmonary embolism without acute cor pulmonale: Secondary | ICD-10-CM

## 2015-12-04 DIAGNOSIS — G4733 Obstructive sleep apnea (adult) (pediatric): Secondary | ICD-10-CM

## 2015-12-04 NOTE — Assessment & Plan Note (Signed)
He has moderate obstructive sleep apnea based on his polysomnogram with concomitant atrial fibrillation. Fortunately, it sounds as if he has been in sinus rhythm but this is with treatment with amiodarone.  Plan: Arrange auto titrating CPAP machine Weight loss encouraged Perhaps in one year if he continues to lose weight we could repeat a polysomnogram to see if he's able to treat this with weight loss alone. Follow-up three-month

## 2015-12-04 NOTE — Assessment & Plan Note (Signed)
Resolved on most recent echocardiogram

## 2015-12-04 NOTE — Assessment & Plan Note (Signed)
He had a provoked pulmonary embolism in February 2017 after his hospitalization in January 2017. His echocardiogram performed earlier this month shows no evidence of pulmonary hypertension so I have a low index of suspicion of chronic thromboembolic pulmonary hypertension.  From my standpoint he could stop taking warfarin at this time. Though he will continue taking it for his atrial fibrillation.

## 2015-12-04 NOTE — Patient Instructions (Signed)
We will arrange for a CPAP titration device to be sent to your home Use CPAP every night Continue exercise regularly and try to lose weight I will see you back in 3 months or sooner if needed

## 2015-12-04 NOTE — Progress Notes (Signed)
Subjective:    Patient ID: Scott Shelton, male    DOB: 10/11/1950, 65 y.o.   MRN: [id number]  Synopsis: Evaluated for pulmonary hypertension after sub-massive pulmonary embolism in February 2017. This was provoked by a January 2017 hospitalization for sepsis, shock, and renal failure requiring temporary dialysis. He also has atrial fibrillation which has been treated by cardiology in 2017. February 2017 echocardiogram showed an LVEF of 55-60% with normal wall motion, mildly calcified aortic valve, mild mitral regurg, mildly reduced systolic right ventricular function with an RVSP of 55 mmHg Cardiology records reviewed from March 2017 where he was evaluated for his A. fib and pulmonary hypertension. February 2017 CT angiogram chest images personally reviewed showing a saber shaped trachea, 3.9 mm right lower lobe pulmonary nodule, and bilateral pulmonary emboli with significant clot burden and RV strain pattern. April 2017 pulmonary function testing ratio 81%, FEV1 2.91 L (78% predicted), FVC 3.57 L (72% predicted), total lung capacity 6.51 (89% predicted), DLCO 25.18 (73% predicted). August 2017 echocardiogram shows normal LVEF, RV size and function normal, no evidence of pulmonary hypertension June 2017 polysomnogram AHI 18, O2 sat duration less than 90% for only 4.7% of the evening  HPI Chief Complaint  Patient presents with  . Follow-up    pt doing well- no breathing complaints at this time. Review Echo from 11/07/15.    [name] says that his life has improved since the last visit. He is back in his apartment, back to wearing normal shoes for the first time in 8 month which he considers a major victory.  He has not respiratory complaints at this time.  He says that he climbs about 4 steps to get into his apartment which is OK, no dyspnea with that.  He is carrying groceries.  His exercise tolerance has improved.  He continues to take warfarin and amiodarone per the recommendations of the  cardiology clinic.  He had a sleep study that showed sleep apnea. He notes that if he sleeps well he    Past Medical History:  Diagnosis Date  . A-fib ([place])   . Bilateral leg edema   . Chronic kidney disease    STAGE III  . GERD (gastroesophageal reflux disease)   . History of cellulitis 05/21/15   BLE  . Hx pulmonary embolism    with hypoxic respiratory failure  . Pulmonary hypertension ([place])       Review of Systems  Constitutional: Negative for chills, fatigue and fever.  HENT: Negative for sinus pressure, sneezing and sore throat.   Respiratory: Negative for cough, shortness of breath and wheezing.   Cardiovascular: Positive for leg swelling. Negative for chest pain and palpitations.       Objective:   Physical Exam Vitals:   12/04/15 1059  BP: 136/72  Pulse: 62  SpO2: 98%  Weight: (!) 327 lb 6.4 oz (148.5 kg)  Height: 5\' 8"  (1.727 m)   RA  Gen: morbidly obese but well appearing HENT: OP clear, TM's clear, neck supple PULM: CTA B, normal percussion CV: RRR, no mgr, trace edema GI: BS+, soft, nontender Derm: no cyanosis or rash Psyche: normal mood and affect   CBC    Component Value Date/Time   WBC 4.8 06/01/2015 0510   RBC 2.96 (L) 06/01/2015 0510   HGB 8.9 (L) 06/01/2015 0510   HCT 27.2 (L) 06/01/2015 0510   HCT 29.7 (L) 06/01/2015 0510   PLT 193 06/01/2015 0510   MCV 100.3 (H) 06/01/2015 0510   MCH 30.1  06/01/2015 0510   MCHC 30.0 06/01/2015 0510   RDW 15.7 (H) 06/01/2015 0510   LYMPHSABS 1.2 05/27/2015 1655   MONOABS 0.8 05/27/2015 1655   EOSABS 0.1 05/27/2015 1655   BASOSABS 0.0 05/27/2015 1655        Assessment & Plan:  PE (pulmonary embolism) He had a provoked pulmonary embolism in February 2017 after his hospitalization in January 2017. His echocardiogram performed earlier this month shows no evidence of pulmonary hypertension so I have a low index of suspicion of chronic thromboembolic pulmonary hypertension.  From my standpoint he  could stop taking warfarin at this time. Though he will continue taking it for his atrial fibrillation.  Pulmonary hypertension ([place]) Resolved on most recent echocardiogram  OSA (obstructive sleep apnea) He has moderate obstructive sleep apnea based on his polysomnogram with concomitant atrial fibrillation. Fortunately, it sounds as if he has been in sinus rhythm but this is with treatment with amiodarone.  Plan: Arrange auto titrating CPAP machine Weight loss encouraged Perhaps in one year if he continues to lose weight we could repeat a polysomnogram to see if he's able to treat this with weight loss alone. Follow-up three-month    Current Outpatient Prescriptions:  .  amiodarone (PACERONE) 200 MG tablet, Take 1 tablet (200 mg total) by mouth daily., Disp: 30 tablet, Rfl: 5 .  carvedilol (COREG) 12.5 MG tablet, Take 1 tablet (12.5 mg total) by mouth 2 (two) times daily with a meal., Disp: 60 tablet, Rfl: 3 .  FLUoxetine (PROZAC) 20 MG capsule, Take 1 capsule (20 mg total) by mouth daily., Disp: 30 capsule, Rfl: 3 .  furosemide (LASIX) 40 MG tablet, Take 1 tablet (40 mg total) by mouth 2 (two) times daily., Disp: 30 tablet, Rfl: 3 .  potassium chloride (K-DUR) 10 MEQ tablet, Take 2 tablets (20 mEq total) by mouth daily., Disp: 60 tablet, Rfl: 3 .  traZODone (DESYREL) 50 MG tablet, Take 1 tablet (50 mg total) by mouth at bedtime., Disp: 30 tablet, Rfl: 3 .  warfarin (COUMADIN) 5 MG tablet, 7.5 mg daily except on Mondays and Wednesdays when you will take 5 mg., Disp: 60 tablet, Rfl: 2

## 2015-12-07 ENCOUNTER — Telehealth: Payer: Self-pay | Admitting: Pulmonary Disease

## 2015-12-07 NOTE — Telephone Encounter (Signed)
noted 

## 2015-12-07 NOTE — Telephone Encounter (Signed)
Called and spoke with Scott Shelton with [facility] and she stated that the pt told her that he cannot afford this at this time. He stated that once he gets the medicaid in place, that he will call us back and we can set this up for him again.  Will forward to BQ to make him aware.

## 2015-12-13 ENCOUNTER — Telehealth: Payer: Self-pay

## 2015-12-13 NOTE — Telephone Encounter (Signed)
This Case Manager placed call to patient to remind him he needs an INR appointment scheduled with Nicoletta Ba, Pharmacist at [facility] around 12/21/15. Unable to reach patient; HIPPA compliant voicemail left requesting return call.

## 2015-12-15 ENCOUNTER — Telehealth: Payer: Self-pay

## 2015-12-15 NOTE — Telephone Encounter (Signed)
This Case Manager placed call to patient to remind him that per Dr. Jarold Song he needs an INR check with Nicoletta Ba, Pharmacist at [facility] around 12/21/15. Patient indicated he was unaware. He confirms he is still taking Coumadin as prescribed. Appointment for INR check scheduled on 12/21/15 at 1115 with Nicoletta Ba, Pharmacist. Patient appreciative of call. No additional needs/concerns identified.

## 2015-12-18 MED FILL — AMIODARONE HCL 200 MG TAB: 200 | 30 days supply | Qty: 30 | Fill #1

## 2015-12-18 MED FILL — FLUoxetine HCL 20 MG CAPS: 20 | 30 days supply | Qty: 30 | Fill #1

## 2015-12-18 MED FILL — FUROSEMIDE 20 MG TABLET: 20 | 30 days supply | Qty: 120 | Fill #1

## 2015-12-18 MED FILL — POTASSIUM CL 10 MEQ TAB SA: 10 | 30 days supply | Qty: 60 | Fill #1

## 2015-12-19 MED FILL — CARVEDILOL 12.5 MG TABLET: 12.5 | 30 days supply | Qty: 60 | Fill #1

## 2015-12-21 ENCOUNTER — Ambulatory Visit: Payer: Self-pay | Attending: Family Medicine | Admitting: Pharmacist

## 2015-12-21 ENCOUNTER — Telehealth: Payer: Self-pay | Admitting: Family Medicine

## 2015-12-21 DIAGNOSIS — I2699 Other pulmonary embolism without acute cor pulmonale: Secondary | ICD-10-CM

## 2015-12-21 DIAGNOSIS — I480 Paroxysmal atrial fibrillation: Secondary | ICD-10-CM

## 2015-12-21 LAB — POCT INR: INR: 2.5

## 2015-12-21 NOTE — Telephone Encounter (Signed)
Pt. Came into the facility requesting a letter from his provider stating that is is homebound due to his condition. Pt. Needs the letter for the [facility]. He would like the letter emailed to him [email or web address] Please f/u

## 2016-01-01 NOTE — Telephone Encounter (Signed)
Writer called patient and LVM to  Inform him that his letter that was requested is ready for pick up at the front desk

## 2016-01-01 NOTE — Telephone Encounter (Signed)
Letter is ready for pick-up.

## 2016-01-16 MED FILL — AMIODARONE HCL 200 MG TAB: 200 | 30 days supply | Qty: 30 | Fill #2

## 2016-01-16 MED FILL — POTASSIUM CL 10 MEQ TAB SA: 10 | 30 days supply | Qty: 60 | Fill #2

## 2016-01-16 MED FILL — FLUoxetine HCL 20 MG CAPS: 20 | 30 days supply | Qty: 30 | Fill #2

## 2016-01-16 MED FILL — WARFARIN SODIUM 5 MG TABLET: 5 | 30 days supply | Qty: 60 | Fill #1

## 2016-01-16 MED FILL — FUROSEMIDE 20 MG TABLET: 20 | 30 days supply | Qty: 120 | Fill #2

## 2016-01-18 ENCOUNTER — Ambulatory Visit: Payer: Self-pay | Attending: Family Medicine | Admitting: Pharmacist

## 2016-01-18 DIAGNOSIS — I480 Paroxysmal atrial fibrillation: Secondary | ICD-10-CM

## 2016-01-18 LAB — POCT INR: INR: 2.5

## 2016-01-29 MED FILL — CARVEDILOL 12.5 MG TABLET: 12.5 | 30 days supply | Qty: 60 | Fill #2

## 2016-02-15 MED FILL — AMIODARONE HCL 200 MG TAB: 200 | 30 days supply | Qty: 30 | Fill #3

## 2016-02-15 MED FILL — FUROSEMIDE 20 MG TABLET: 20 | 30 days supply | Qty: 120 | Fill #3

## 2016-02-15 MED FILL — POTASSIUM CL 10 MEQ TAB SA: 10 | 30 days supply | Qty: 60 | Fill #3

## 2016-02-15 MED FILL — FLUoxetine HCL 20 MG CAPS: 20 | 30 days supply | Qty: 30 | Fill #3

## 2016-02-20 MED FILL — CARVEDILOL 12.5 MG TABLET: 12.5 | 30 days supply | Qty: 60 | Fill #3

## 2016-02-22 ENCOUNTER — Encounter: Payer: Self-pay | Admitting: Family Medicine

## 2016-02-22 ENCOUNTER — Ambulatory Visit: Payer: Self-pay | Attending: Family Medicine | Admitting: Family Medicine

## 2016-02-22 ENCOUNTER — Other Ambulatory Visit: Payer: Self-pay | Admitting: Family Medicine

## 2016-02-22 VITALS — BP 120/73 | HR 62 | Temp 99.1°F | Resp 24 | Ht 68.0 in | Wt 315.0 lb

## 2016-02-22 DIAGNOSIS — I10 Essential (primary) hypertension: Secondary | ICD-10-CM

## 2016-02-22 DIAGNOSIS — I890 Lymphedema, not elsewhere classified: Secondary | ICD-10-CM

## 2016-02-22 DIAGNOSIS — G4733 Obstructive sleep apnea (adult) (pediatric): Secondary | ICD-10-CM

## 2016-02-22 DIAGNOSIS — F419 Anxiety disorder, unspecified: Secondary | ICD-10-CM

## 2016-02-22 DIAGNOSIS — F329 Major depressive disorder, single episode, unspecified: Secondary | ICD-10-CM | POA: Insufficient documentation

## 2016-02-22 DIAGNOSIS — Z1159 Encounter for screening for other viral diseases: Secondary | ICD-10-CM

## 2016-02-22 DIAGNOSIS — F418 Other specified anxiety disorders: Secondary | ICD-10-CM

## 2016-02-22 DIAGNOSIS — F32A Depression, unspecified: Secondary | ICD-10-CM

## 2016-02-22 DIAGNOSIS — I2692 Saddle embolus of pulmonary artery without acute cor pulmonale: Secondary | ICD-10-CM

## 2016-02-22 DIAGNOSIS — I2782 Chronic pulmonary embolism: Secondary | ICD-10-CM

## 2016-02-22 DIAGNOSIS — I480 Paroxysmal atrial fibrillation: Secondary | ICD-10-CM

## 2016-02-22 LAB — POCT INR: INR: 2.5

## 2016-02-22 MED ORDER — FUROSEMIDE 40 MG PO TABS: 40.0000 mg | ORAL_TABLET | Freq: Two times a day (BID) | ORAL | 3 refills | Status: DC

## 2016-02-22 MED ORDER — FLUOXETINE HCL 20 MG PO CAPS: 20.0000 mg | ORAL_CAPSULE | Freq: Every day | ORAL | 3 refills | Status: DC

## 2016-02-22 MED ORDER — CARVEDILOL 12.5 MG PO TABS: 12.5000 mg | ORAL_TABLET | Freq: Two times a day (BID) | ORAL | 3 refills | Status: DC

## 2016-02-22 MED ORDER — WARFARIN SODIUM 5 MG PO TABS: ORAL_TABLET | ORAL | 2 refills | Status: DC

## 2016-02-22 MED ORDER — POTASSIUM CHLORIDE ER 10 MEQ PO TBCR: 20.0000 meq | EXTENDED_RELEASE_TABLET | Freq: Every day | ORAL | 3 refills | Status: DC

## 2016-02-22 MED FILL — ?WARFARIN SODIUM 5 MG TABLE: 5 | 30 days supply | Qty: 60 | Fill #0

## 2016-02-22 NOTE — Progress Notes (Signed)
Subjective:  Patient ID: Scott Shelton, male    DOB: Mar 27, 1951  Age: 65 y.o. MRN: [id number]  CC: Follow-up on lymphedema  HPI Scott Shelton is a 65 year old male with a history of paroxysmal atrial fibrillation, obstructive sleep apnea, lymphedema, pulmonary embolism (on Xarelto), pulmonary hypertension, obstructive sleep apnea who comes into the clinic for a follow-up visit.  His last visit with his pulmonologist, Dr.McQuaid was on 11/2015 where the plan was also to arrange for autotitrating CPAP machine however the patient has been unable to afford a CPAP machine. No need for anticoagulation for PE as per pulmonary given he had a provoked PE in 05/2015 however he will need anticoagulation for A. fib. He is working on weight loss and has lost 17 pounds in the last 3 months.  Regarding his lymphedema he uses compression stockings and reports improvement in symptoms.  His INR is being checked by the clinical pharmacist and has been therapeutic for some time now.  He ambulates with the aid of a walker, and this is because he gets short of breath on moderate exertion. He has no complaints today.  Past Medical History:  Diagnosis Date  . A-fib ([place])   . Bilateral leg edema   . Chronic kidney disease    STAGE III  . GERD (gastroesophageal reflux disease)   . History of cellulitis 05/21/15   BLE  . Hx pulmonary embolism    with hypoxic respiratory failure  . Pulmonary hypertension     Past Surgical History:  Procedure Laterality Date  . CATARACT EXTRACTION    . MELANOMA EXCISION Left 08/17/2015   Procedure: WIDE EXCISION MELANOMA  OF THE BACK ;  Surgeon: [name], MD;  Location: [facility];  Service: General;  Laterality: Left;    No Known Allergies   Outpatient Medications Prior to Visit  Medication Sig Dispense Refill  . amiodarone (PACERONE) 200 MG tablet Take 1 tablet (200 mg total) by mouth daily. 30 tablet 5  . traZODone (DESYREL) 50 MG tablet Take 1  tablet (50 mg total) by mouth at bedtime. 30 tablet 3  . carvedilol (COREG) 12.5 MG tablet Take 1 tablet (12.5 mg total) by mouth 2 (two) times daily with a meal. 60 tablet 3  . FLUoxetine (PROZAC) 20 MG capsule Take 1 capsule (20 mg total) by mouth daily. 30 capsule 3  . furosemide (LASIX) 40 MG tablet Take 1 tablet (40 mg total) by mouth 2 (two) times daily. 30 tablet 3  . potassium chloride (K-DUR) 10 MEQ tablet Take 2 tablets (20 mEq total) by mouth daily. 60 tablet 3  . warfarin (COUMADIN) 5 MG tablet 7.5 mg daily except on Mondays and Wednesdays when you will take 5 mg. 60 tablet 2   No facility-administered medications prior to visit.     ROS Review of Systems Constitutional: Negative for activity change and appetite change.  HENT: Negative for sinus pressure and sore throat.   Eyes: Negative for visual disturbance.  Respiratory: Negative for cough, chest tightness and shortness of breath only on moderate exertion.   Cardiovascular: Positive for leg swelling. Negative for chest pain.  Gastrointestinal: Negative for abdominal distention, abdominal pain, constipation and diarrhea.  Endocrine: Negative.   Genitourinary: Negative for dysuria.  Musculoskeletal: Negative for joint swelling and myalgias.  Skin: Negative for rash.  Allergic/Immunologic: Negative.   Neurological: Negative for weakness, light-headedness and numbness.  Psychiatric/Behavioral: Negative for dysphoric mood and suicidal ideas.   Objective:  BP 120/73   Pulse  62   Temp 99.1 F (37.3 C) (Oral)   Resp (!) 24   Ht 5\' 8"  (1.727 m)   Wt (!) 315 lb (142.9 kg)   SpO2 97%   BMI 47.90 kg/m   BP/Weight 02/22/2016 12/04/2015 [id number]  Systolic BP 123456 [id number]  Diastolic BP 73 72 76  Wt. (Lbs) 315 327.4 332.6  BMI 47.9 49.78 50.57      Physical Exam Constitutional: He is oriented to person, place, and time. He appears well-developed and well-nourished.  Morbidly obese  Cardiovascular: Normal rate and  normal heart sounds.   No murmur heard. Unable to palpate dorsalis pedis bilaterally due to edema  Pulmonary/Chest: Effort normal and breath sounds normal. He has no wheezes. He has no rales. He exhibits no tenderness.  Abdominal: Soft. Bowel sounds are normal. He exhibits no distension and no mass. There is no tenderness.  Musculoskeletal: Normal range of motion. He exhibits edema (bilateral lymphedema).  Neurological: He is alert and oriented to person, place, and time.  Psych: Normal  CMP Latest Ref Rng & Units 11/21/2015 06/02/2015 06/01/2015  Glucose 65 - 99 mg/dL 97 95 100(H)  BUN 7 - 25 mg/dL 25 10 8   Creatinine 0.70 - 1.25 mg/dL 1.20 1.30(H) 1.45(H)  Sodium 135 - 146 mmol/L 137 140 142  Potassium 3.5 - 5.3 mmol/L 4.4 3.1(L) 3.7  Chloride 98 - 110 mmol/L 103 106 108  CO2 20 - 31 mmol/L 26 26 26   Calcium 8.6 - 10.3 mg/dL 9.6 9.7 9.6  Total Protein 6.1 - 8.1 g/dL 6.1 - -  Total Bilirubin 0.2 - 1.2 mg/dL 0.4 - -  Alkaline Phos 40 - 115 U/L 65 - -  AST 10 - 35 U/L 18 - -  ALT 9 - 46 U/L 17 - -    Assessment & Plan:   1. Paroxysmal atrial fibrillation (HCC) Therapeutic at 2.5 (goal INR is 2.0-3.0) continue current dose of Coumadin at 5 mg on Mondays and Wednesdays and 7.5 mg on other days - warfarin (COUMADIN) 5 MG tablet; 7.5 mg daily except on Mondays and Wednesdays when you will take 5 mg.  Dispense: 60 tablet; Refill: 2 - INR  2. Lymphedema Improving Continue compression socks - furosemide (LASIX) 40 MG tablet; Take 1 tablet (40 mg total) by mouth 2 (two) times daily.  Dispense: 30 tablet; Refill: 3  3. Screening for viral disease - HIV antibody (with reflex) - Hepatitis C antibody, reflex  4. 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  5. OSA (obstructive sleep apnea) Unable to afford CPAP machine  6. Anxiety and depression Doing well - FLUoxetine (PROZAC) 20 MG capsule; Take 1 capsule (20 mg total) by mouth daily.  Dispense: 30 capsule; Refill: 3  7. Essential hypertension Controlled - carvedilol (COREG) 12.5 MG tablet; Take 1 tablet (12.5 mg total) by mouth 2 (two) times daily with a meal.  Dispense: 60 tablet; Refill: 3   Meds ordered this encounter  Medications  . carvedilol (COREG) 12.5 MG tablet    Sig: Take 1 tablet (12.5 mg total) by mouth 2 (two) times daily with a meal.    Dispense:  60 tablet    Refill:  3  . FLUoxetine (PROZAC) 20 MG capsule    Sig: Take 1 capsule (20 mg total) by  mouth daily.    Dispense:  30 capsule    Refill:  3  . furosemide (LASIX) 40 MG tablet    Sig: Take 1 tablet (40 mg total) by mouth 2 (two) times daily.    Dispense:  30 tablet    Refill:  3  . potassium chloride (K-DUR) 10 MEQ tablet    Sig: Take 2 tablets (20 mEq total) by mouth daily.    Dispense:  60 tablet    Refill:  3  . warfarin (COUMADIN) 5 MG tablet    Sig: 7.5 mg daily except on Mondays and Wednesdays when you will take 5 mg.    Dispense:  60 tablet    Refill:  2    Follow-up: Return in about 3 months (around 05/24/2016) for follow up of lymphedema; 1 month with Erline Levine- INR.   Arnoldo Morale MD

## 2016-02-22 NOTE — Progress Notes (Signed)
F/u inr/lymphedema

## 2016-02-22 NOTE — Progress Notes (Signed)
INR 2.5

## 2016-02-22 NOTE — Patient Instructions (Signed)

## 2016-02-23 ENCOUNTER — Telehealth: Payer: Self-pay

## 2016-02-23 LAB — HEPATITIS C ANTIBODY: HCV Ab: NEGATIVE

## 2016-02-23 LAB — HIV ANTIBODY (ROUTINE TESTING W REFLEX): HIV: NONREACTIVE

## 2016-02-23 NOTE — Telephone Encounter (Signed)
Writer called patient per Dr. Jarold Song to discuss patient's lab results.  Patient stated understanding.

## 2016-03-06 ENCOUNTER — Ambulatory Visit: Payer: Self-pay | Admitting: Pulmonary Disease

## 2016-03-19 ENCOUNTER — Other Ambulatory Visit: Payer: Self-pay | Admitting: Family Medicine

## 2016-03-19 MED FILL — AMIODARONE HCL 200 MG TAB: 200 | 30 days supply | Qty: 30 | Fill #4

## 2016-03-20 MED FILL — FUROSEMIDE 20 MG TABLET: 20 | 30 days supply | Qty: 120 | Fill #0

## 2016-03-20 MED FILL — FLUoxetine HCL 20 MG CAPS: 20 | 30 days supply | Qty: 30 | Fill #0

## 2016-03-20 MED FILL — POTASSIUM CL 10 MEQ TAB SA: 10 | 30 days supply | Qty: 60 | Fill #0

## 2016-03-25 MED FILL — CARVEDILOL 12.5 MG TABLET: 12.5 | 30 days supply | Qty: 60 | Fill #0

## 2016-03-26 ENCOUNTER — Ambulatory Visit: Payer: Self-pay | Attending: Internal Medicine | Admitting: Pharmacist

## 2016-03-26 DIAGNOSIS — I2692 Saddle embolus of pulmonary artery without acute cor pulmonale: Secondary | ICD-10-CM

## 2016-03-26 DIAGNOSIS — I480 Paroxysmal atrial fibrillation: Secondary | ICD-10-CM

## 2016-03-26 LAB — POCT INR: INR: 2.1

## 2016-04-12 MED FILL — ?WARFARIN SODIUM 5 MG TABLE: 5 | 30 days supply | Qty: 60 | Fill #1

## 2016-04-19 MED FILL — AMIODARONE HCL 200 MG TAB: 200 | 30 days supply | Qty: 30 | Fill #5

## 2016-04-22 MED FILL — FUROSEMIDE 20 MG TABLET: 20 | 30 days supply | Qty: 120 | Fill #1

## 2016-04-22 MED FILL — FLUoxetine HCL 20 MG CAPS: 20 | 30 days supply | Qty: 30 | Fill #1

## 2016-04-22 MED FILL — CARVEDILOL 12.5 MG TABLET: 12.5 | 30 days supply | Qty: 60 | Fill #1

## 2016-04-22 MED FILL — POTASSIUM CL 10 MEQ TAB SA: 10 | 30 days supply | Qty: 60 | Fill #1

## 2016-04-23 ENCOUNTER — Ambulatory Visit: Payer: Self-pay | Attending: Family Medicine | Admitting: Pharmacist

## 2016-04-23 DIAGNOSIS — I480 Paroxysmal atrial fibrillation: Secondary | ICD-10-CM

## 2016-04-23 DIAGNOSIS — I2782 Chronic pulmonary embolism: Secondary | ICD-10-CM

## 2016-04-23 DIAGNOSIS — I2692 Saddle embolus of pulmonary artery without acute cor pulmonale: Secondary | ICD-10-CM

## 2016-04-23 LAB — POCT INR: INR: 2.8

## 2016-05-13 ENCOUNTER — Telehealth: Payer: Self-pay | Admitting: Family Medicine

## 2016-05-13 NOTE — Telephone Encounter (Signed)
Pt called is scheduled to see Dr. Jarold Song 02/12 and has INR check in 02/13 pt would like to know if INR check can be done 02/12 by Dr. Jarold Song..please  f/up

## 2016-05-13 NOTE — Telephone Encounter (Signed)
We can just get an INR during appt with Dr. Jarold Song, patient does not need to come 2/13. Patient made aware of this.

## 2016-05-14 ENCOUNTER — Telehealth: Payer: Self-pay | Admitting: *Deleted

## 2016-05-14 ENCOUNTER — Ambulatory Visit (INDEPENDENT_AMBULATORY_CARE_PROVIDER_SITE_OTHER): Payer: Self-pay | Admitting: Cardiovascular Disease

## 2016-05-14 ENCOUNTER — Encounter: Payer: Self-pay | Admitting: Cardiovascular Disease

## 2016-05-14 VITALS — BP 140/74 | HR 71 | Ht 68.0 in | Wt 387.0 lb

## 2016-05-14 DIAGNOSIS — I10 Essential (primary) hypertension: Secondary | ICD-10-CM

## 2016-05-14 DIAGNOSIS — I480 Paroxysmal atrial fibrillation: Secondary | ICD-10-CM

## 2016-05-14 DIAGNOSIS — I493 Ventricular premature depolarization: Secondary | ICD-10-CM

## 2016-05-14 DIAGNOSIS — I5032 Chronic diastolic (congestive) heart failure: Secondary | ICD-10-CM

## 2016-05-14 NOTE — Progress Notes (Signed)
Cardiology Office Note   Date:  05/14/2016   ID:  Scott Shelton, DOB Jun 28, 1950, MRN [id number]  PCP:  Scott Morale, MD  Cardiologist:   Scott Latch, MD  Pulmonologist: Dr. Lake Shelton  Chief Complaint  Patient presents with  . Follow-up     History of Present Illness: Scott Shelton is a 66 y.o. male with paroxysmal atrial fibrillation, prior PE, OSA not on CPAP, melanoma s/p excision, and GERD who presents for follow-up.  [name] was hospitalized February 2016 with atrial fibrillation.  He developed septic shock likely due to lower external any cellulitis. He then had an episode of syncope and was brought to the emergency department via EMS. He was found to have bilateral pulmonary emboli.  During that hospitalization he had intermittent episodes of atrial fibrillation. He was anticoagulated with warfarin.  Echo revealed normal systolic function with a PA systolic pressure of 57 mmHg.  he was discharged to [facility] for rehabilitation and lymphedema treatments. He was referred for a sleep study that revealed moderate sleep-disordered breathing. It was recommended that he consider CPAP or autoPap.  However, he has been unable to afford the machine.  [name] had a repeat echo 11/07/15 that revealed LVEF  60-65% with a PASP 31 mmHg.  Since his last appointment [name] has been doing well.  He notes that he hasn't been exercising much but plans to start walking with his care nerves II-3 times per week. His weight has increased, but he attributes this to his diet and lack of activity. He recently had gastroenteritis with emesis. He held his Lasix and did have an increase in his lower extremity edema during that time. However he resume the Lasix after this resolved and the edema has improved as well. He denies chest pain, shortness of breath, orthopnea, or PND. He has not noted any palpitations, lightheadedness, or dizziness.   Past Medical History:  Diagnosis Date  . A-fib ([place])    . Bilateral leg edema   . Chronic kidney disease    STAGE III  . GERD (gastroesophageal reflux disease)   . History of cellulitis 05/21/15   BLE  . Hx pulmonary embolism    with hypoxic respiratory failure  . Pulmonary hypertension     Past Surgical History:  Procedure Laterality Date  . CATARACT EXTRACTION    . MELANOMA EXCISION Left 08/17/2015   Procedure: WIDE EXCISION MELANOMA  OF THE BACK ;  Surgeon: Scott Keens, MD;  Location: [facility];  Service: General;  Laterality: Left;     Current Outpatient Prescriptions  Medication Sig Dispense Refill  . amiodarone (PACERONE) 200 MG tablet Take 1 tablet (200 mg total) by mouth daily. 30 tablet 5  . carvedilol (COREG) 12.5 MG tablet Take 1 tablet (12.5 mg total) by mouth 2 (two) times daily with a meal. 60 tablet 3  . FLUoxetine (PROZAC) 20 MG capsule Take 1 capsule (20 mg total) by mouth daily. 30 capsule 3  . FLUoxetine (PROZAC) 20 MG capsule TAKE 1 CAPSULE BY MOUTH DAILY. 30 capsule 2  . furosemide (LASIX) 20 MG tablet TAKE 2 TABLET BY MOUTH 2 TIMES DAILY. 120 tablet 1  . furosemide (LASIX) 40 MG tablet Take 1 tablet (40 mg total) by mouth 2 (two) times daily. 30 tablet 3  . potassium chloride (K-DUR) 10 MEQ tablet Take 2 tablets (20 mEq total) by mouth daily. 60 tablet 3  . potassium chloride (K-DUR) 10 MEQ tablet TAKE 2 TABLETS BY MOUTH DAILY.  60 tablet 2  . traZODone (DESYREL) 50 MG tablet Take 1 tablet (50 mg total) by mouth at bedtime. 30 tablet 3  . warfarin (COUMADIN) 5 MG tablet 7.5 mg daily except on Mondays and Wednesdays when you will take 5 mg. 60 tablet 2   No current facility-administered medications for this visit.     Allergies:   Patient has no known allergies.    Social History:  The patient  reports that he has never smoked. He has never used smokeless tobacco. He reports that he does not drink alcohol or use drugs.   Family History:  The patient's family history includes Cancer in his  mother.    ROS:  Please see the history of present illness.   Otherwise, review of systems are positive for none.   All other systems are reviewed and negative.    PHYSICAL EXAM: VS:  BP 140/74   Pulse 71   Ht 5\' 8"  (1.727 m)   Wt (!) 175.5 kg (387 lb)   BMI 58.84 kg/m  , BMI Body mass index is 58.84 kg/m. GENERAL:  Well-appearing HEENT:  Pupils equal round and reactive, fundi not visualized, oral mucosa unremarkable NECK:  No jugular venous distention, waveform within normal limits, carotid upstroke brisk and symmetric, no bruits, no thyromegaly LYMPHATICS:  No cervical adenopathy LUNGS:  Clear to auscultation bilaterally HEART:  RRR.  PMI not displaced or sustained,S1 and S2 within normal limits, no S3, no S4, no clicks, no rubs, no murmurs ABD:  Flat, positive bowel sounds normal in frequency in pitch, no bruits, no rebound, no guarding, no midline pulsatile mass, no hepatomegaly, no splenomegaly EXT:  2 plus pulses throughout, no cyanosis no clubbing.  No edema SKIN:  No rashes no nodules NEURO:  Cranial nerves II through XII grossly intact, motor grossly intact throughout PSYCH:  Cognitively intact, oriented to person place and time   EKG:  EKG is ordered today. 10/27/15: Sinus bradycardia. Rate 59 bpm. Nonspecific T wave abnormalities. 05/14/16: Sinus rhythm.  Rate 71 bpm.  PVCs.    Echo 05/29/15: Study Conclusions  - Left ventricle: The cavity size was normal. There was mild  concentric hypertrophy. Systolic function was normal. The  estimated ejection fraction was in the range of 55% to 60%. Wall  motion was normal; there were no regional wall motion  abnormalities. - Aortic valve: Trileaflet; mildly thickened, mildly calcified  leaflets. - Mitral valve: Calcified annulus. There was mild regurgitation. - Right ventricle: Systolic function was mildly reduced. - Pulmonic valve: There was trivial regurgitation. - Pulmonary arteries: PA peak pressure: 57 mm Hg  (S).  Impressions:  - The right ventricular systolic pressure was increased consistent  with moderate pulmonary hypertension.  Echo 11/07/15: LVEF 60-65^.  Grade 1 diastolic dysfunction. Mild tricuspid regurgitation.  PASP 31 mmHg.  Recent Labs: 05/27/2015: B Natriuretic Peptide 140.4 05/30/2015: Magnesium 2.2 06/01/2015: Hemoglobin 8.9; Platelets 193 11/21/2015: ALT 17; BUN 25; Creat 1.20; Potassium 4.4; Sodium 137; TSH 1.87    Lipid Panel    Component Value Date/Time   CHOL 177 11/21/2015 0849   TRIG 220 (H) 11/21/2015 0849   HDL 36 (L) 11/21/2015 0849   CHOLHDL 4.9 11/21/2015 0849   VLDL 44 (H) 11/21/2015 0849   LDLCALC 97 11/21/2015 0849      Wt Readings from Last 3 Encounters:  05/14/16 (!) 175.5 kg (387 lb)  02/22/16 (!) 142.9 kg (315 lb)  12/04/15 (!) 148.5 kg (327 lb 6.4 oz)  ASSESSMENT AND PLAN:  # Atrial fibrillation: Mr. Stobaugh remains in sinus rhythm.  He was noted to have 2 PVCs on his EKG today. He is asymptomatic. We will check a basic metabolic panel and magnesium today. Continue warfarin for anticoagulation. Continue carvedilol.   Given that he has not had any recurrent atrial fibrillation we will stop amiodarone at this time This patients CHA2DS2-VASc Score and unadjusted Ischemic Stroke Rate (% per year) is equal to 0.6 % stroke rate/year from a score of 1 Above score calculated as 1 point each if present [CHF, HTN, DM, Vascular=MI/PAD/Aortic Plaque, Age if 65-74, or Male] Above score calculated as 2 points each if present [Age > 75, or Stroke/TIA/TE]  # Hypertension: Mr. Roselle blood pressure is 140/74. His goal should be less than 130/80. He reports that it has been well-controlled. He will ask his home health agency to check his blood pressures when she comes to see him and bring this to follow-up. Continue carvedilol.  # Pulmonary hypertension: Resolved.   Current medicines are reviewed at length with the patient today.  The patient does not  have concerns regarding medicines.  The following changes have been made:  no change  Labs/ tests ordered today include:   Orders Placed This Encounter  Procedures  . Basic metabolic panel  . Magnesium  . EKG 12-Lead     Disposition:   FU with Laurence Folz C. Oval Linsey, MD, [facility] in 3 months.   This note was written with the assistance of speech recognition software.  Please excuse any transcriptional errors.  Signed, Floyd Lusignan C. Oval Linsey, MD, [facility]  05/14/2016 5:35 PM    [place]

## 2016-05-14 NOTE — Patient Instructions (Signed)
Medication Instructions:  Your physician recommends that you continue on your current medications as directed. Please refer to the Current Medication list given to you today.  Labwork: BMET/MAGNESIUM AT [facility] ON THE FIRS FLOOR   Testing/Procedures: NONE  Follow-Up: Your physician recommends that you schedule a follow-up appointment in: 3 MONTH OV  If you need a refill on your cardiac medications before your next appointment, please call your pharmacy.

## 2016-05-14 NOTE — Telephone Encounter (Signed)
Per Dr Oval Linsey patient can stop Amiodarone and his office visit today Left message to call back tomorrow after 8:30

## 2016-05-15 NOTE — Telephone Encounter (Signed)
Follow up ° ° ° ° ° °Returning a call to the nurse °

## 2016-05-15 NOTE — Telephone Encounter (Signed)
Spoke with patient and stated that within the last 30 days he tried to decrease the Amiodarone to 1/2-1/4 daily secondary to not being able to pick up Rx During that time he felt "funny". When asked to describe what funny meant stated he had a tightness in his chest during the time he decreased dose. Denied any fast or irregular heart beat that he could tell. Tightness not with exertion  Will forward to Dr Oval Linsey for review

## 2016-05-20 ENCOUNTER — Telehealth: Payer: Self-pay | Admitting: Family Medicine

## 2016-05-20 ENCOUNTER — Ambulatory Visit: Payer: Self-pay | Attending: Family Medicine | Admitting: Family Medicine

## 2016-05-20 ENCOUNTER — Encounter: Payer: Self-pay | Admitting: Family Medicine

## 2016-05-20 VITALS — BP 115/66 | HR 76 | Temp 98.8°F | Ht 68.0 in | Wt 393.2 lb

## 2016-05-20 DIAGNOSIS — I890 Lymphedema, not elsewhere classified: Secondary | ICD-10-CM | POA: Insufficient documentation

## 2016-05-20 DIAGNOSIS — N400 Enlarged prostate without lower urinary tract symptoms: Secondary | ICD-10-CM | POA: Insufficient documentation

## 2016-05-20 DIAGNOSIS — K219 Gastro-esophageal reflux disease without esophagitis: Secondary | ICD-10-CM | POA: Insufficient documentation

## 2016-05-20 DIAGNOSIS — Z86711 Personal history of pulmonary embolism: Secondary | ICD-10-CM | POA: Insufficient documentation

## 2016-05-20 DIAGNOSIS — I272 Pulmonary hypertension, unspecified: Secondary | ICD-10-CM | POA: Insufficient documentation

## 2016-05-20 DIAGNOSIS — Z85820 Personal history of malignant melanoma of skin: Secondary | ICD-10-CM | POA: Insufficient documentation

## 2016-05-20 DIAGNOSIS — N401 Enlarged prostate with lower urinary tract symptoms: Secondary | ICD-10-CM | POA: Insufficient documentation

## 2016-05-20 DIAGNOSIS — M25511 Pain in right shoulder: Secondary | ICD-10-CM | POA: Insufficient documentation

## 2016-05-20 DIAGNOSIS — G4733 Obstructive sleep apnea (adult) (pediatric): Secondary | ICD-10-CM | POA: Insufficient documentation

## 2016-05-20 DIAGNOSIS — Z7901 Long term (current) use of anticoagulants: Secondary | ICD-10-CM | POA: Insufficient documentation

## 2016-05-20 DIAGNOSIS — F329 Major depressive disorder, single episode, unspecified: Secondary | ICD-10-CM

## 2016-05-20 DIAGNOSIS — F419 Anxiety disorder, unspecified: Principal | ICD-10-CM

## 2016-05-20 DIAGNOSIS — R350 Frequency of micturition: Secondary | ICD-10-CM | POA: Insufficient documentation

## 2016-05-20 DIAGNOSIS — I129 Hypertensive chronic kidney disease with stage 1 through stage 4 chronic kidney disease, or unspecified chronic kidney disease: Secondary | ICD-10-CM | POA: Insufficient documentation

## 2016-05-20 DIAGNOSIS — F418 Other specified anxiety disorders: Secondary | ICD-10-CM

## 2016-05-20 DIAGNOSIS — R230 Cyanosis: Secondary | ICD-10-CM

## 2016-05-20 DIAGNOSIS — I4891 Unspecified atrial fibrillation: Secondary | ICD-10-CM | POA: Insufficient documentation

## 2016-05-20 DIAGNOSIS — N183 Chronic kidney disease, stage 3 (moderate): Secondary | ICD-10-CM | POA: Insufficient documentation

## 2016-05-20 DIAGNOSIS — R0602 Shortness of breath: Secondary | ICD-10-CM | POA: Insufficient documentation

## 2016-05-20 DIAGNOSIS — I10 Essential (primary) hypertension: Secondary | ICD-10-CM

## 2016-05-20 LAB — COMPLETE METABOLIC PANEL WITH GFR
ALBUMIN: 3.8 g/dL (ref 3.6–5.1)
ALK PHOS: 68 U/L (ref 40–115)
ALT: 16 U/L (ref 9–46)
AST: 16 U/L (ref 10–35)
BILIRUBIN TOTAL: 0.3 mg/dL (ref 0.2–1.2)
BUN: 20 mg/dL (ref 7–25)
CO2: 21 mmol/L (ref 20–31)
CREATININE: 1.34 mg/dL — AB (ref 0.70–1.25)
Calcium: 9.6 mg/dL (ref 8.6–10.3)
Chloride: 106 mmol/L (ref 98–110)
GFR, Est African American: 65 mL/min (ref >=60)
GFR, Est Non African American: 56 mL/min — ABNORMAL LOW (ref >=60)
GLUCOSE: 102 mg/dL — AB (ref 65–99)
Potassium: 4.8 mmol/L (ref 3.5–5.3)
SODIUM: 139 mmol/L (ref 135–146)
TOTAL PROTEIN: 6.3 g/dL (ref 6.1–8.1)

## 2016-05-20 LAB — POCT INR: INR: 3.5

## 2016-05-20 MED ORDER — CARVEDILOL 12.5 MG PO TABS: 12.5000 mg | ORAL_TABLET | Freq: Two times a day (BID) | ORAL | 3 refills | Status: DC

## 2016-05-20 MED ORDER — FUROSEMIDE 40 MG PO TABS: 40.0000 mg | ORAL_TABLET | Freq: Two times a day (BID) | ORAL | 3 refills | Status: DC

## 2016-05-20 MED ORDER — FLUOXETINE HCL 20 MG PO CAPS: 20.0000 mg | ORAL_CAPSULE | Freq: Every day | ORAL | 3 refills | Status: DC

## 2016-05-20 MED ORDER — TAMSULOSIN HCL 0.4 MG PO CAPS: 0.4000 mg | ORAL_CAPSULE | Freq: Every day | ORAL | 3 refills | Status: DC

## 2016-05-20 MED ORDER — FLUOXETINE HCL 20 MG PO CAPS: 40.0000 mg | ORAL_CAPSULE | Freq: Every day | ORAL | 3 refills | Status: DC

## 2016-05-20 MED ORDER — POTASSIUM CHLORIDE ER 10 MEQ PO TBCR
20.0000 meq | EXTENDED_RELEASE_TABLET | Freq: Two times a day (BID) | ORAL | 3 refills | Status: DC
Start: 2016-05-20 — End: 2016-10-25

## 2016-05-20 MED FILL — ?CARVEDILOL 12.5 MG TABLET: 12.5 | 30 days supply | Qty: 60 | Fill #0

## 2016-05-20 MED FILL — TAMSULOSIN HCL 0.4 MG CAP: 0.4 | 30 days supply | Qty: 30 | Fill #0

## 2016-05-20 MED FILL — POTASSIUM CL 10 MEQ TAB SA: 10 | 30 days supply | Qty: 120 | Fill #0

## 2016-05-20 MED FILL — ?FUROSEMIDE 40 MG TABLET: 40 | 15 days supply | Qty: 30 | Fill #0

## 2016-05-20 MED FILL — FLUoxetine HCL 20 MG CAPS: 20 | 30 days supply | Qty: 30 | Fill #0

## 2016-05-20 NOTE — Progress Notes (Signed)
Subjective:  Patient ID: Scott Shelton, male    DOB: Aug 30, 1950  Age: 66 y.o. MRN: [id number]  CC: Atrial Fibrillation; pulmonary embolism; and Coagulation Disorder   HPI Scott Shelton is a 66 year old male with a history of paroxysmal atrial fibrillation on Coumadin), obstructive sleep apnea, lymphedema, h/o pulmonary embolism, obstructive sleep apnea who comes into the clinic for a follow-up visit.  His last visit with his pulmonologist, Dr.McQuaid was on 11/2015; he does have sleep apnea however the patient has been unable to afford a CPAP machine.He is working on weight loss. Last saw his cardiologist-Dr. Oval Linsey 6 days ago and his amiodarone was discontinued. Regarding his lymphedema he uses compression stockings and reports improvement in symptoms.  His INR is being checked by the clinical pharmacist and is supra therapeutic now however he endorses using saw palmetto for lower urinary tract symptoms with frequency and nocturia  He ambulates with the aid of a walker, and this is because he gets short of breath on moderate exertion. He occasionally has right shoulder pain and some restriction of range of motion but does not take any medications for this.  Past Medical History:  Diagnosis Date  . A-fib ([place])   . Bilateral leg edema   . Chronic kidney disease    STAGE III  . GERD (gastroesophageal reflux disease)   . History of cellulitis 05/21/15   BLE  . Hx pulmonary embolism    with hypoxic respiratory failure  . Pulmonary hypertension     Past Surgical History:  Procedure Laterality Date  . CATARACT EXTRACTION    . MELANOMA EXCISION Left 08/17/2015   Procedure: WIDE EXCISION MELANOMA  OF THE BACK ;  Surgeon: [name], MD;  Location: [facility];  Service: General;  Laterality: Left;    No Known Allergies   Outpatient Medications Prior to Visit  Medication Sig Dispense Refill  . amiodarone (PACERONE) 200 MG tablet Take 1 tablet (200  mg total) by mouth daily. 30 tablet 5  . traZODone (DESYREL) 50 MG tablet Take 1 tablet (50 mg total) by mouth at bedtime. 30 tablet 3  . warfarin (COUMADIN) 5 MG tablet 7.5 mg daily except on Mondays and Wednesdays when you will take 5 mg. 60 tablet 2  . carvedilol (COREG) 12.5 MG tablet Take 1 tablet (12.5 mg total) by mouth 2 (two) times daily with a meal. 60 tablet 3  . FLUoxetine (PROZAC) 20 MG capsule Take 1 capsule (20 mg total) by mouth daily. 30 capsule 3  . furosemide (LASIX) 40 MG tablet Take 1 tablet (40 mg total) by mouth 2 (two) times daily. 30 tablet 3  . potassium chloride (K-DUR) 10 MEQ tablet Take 2 tablets (20 mEq total) by mouth daily. (Patient taking differently: Take 20 mEq by mouth 2 (two) times daily. ) 60 tablet 3  . FLUoxetine (PROZAC) 20 MG capsule TAKE 1 CAPSULE BY MOUTH DAILY. 30 capsule 2  . furosemide (LASIX) 20 MG tablet TAKE 2 TABLET BY MOUTH 2 TIMES DAILY. 120 tablet 1  . potassium chloride (K-DUR) 10 MEQ tablet TAKE 2 TABLETS BY MOUTH DAILY. 60 tablet 2   No facility-administered medications prior to visit.     ROS Review of Systems  Constitutional: Negative for activity change and appetite change.  HENT: Negative for sinus pressure and sore throat.   Eyes: Negative for visual disturbance.  Respiratory: Negative for cough, chest tightness and shortness of breath.   Cardiovascular: Negative for chest pain and leg  swelling.  Gastrointestinal: Negative for abdominal distention, abdominal pain, constipation and diarrhea.  Endocrine: Negative.   Genitourinary: Negative for dysuria.  Musculoskeletal:       See hpi  Skin: Negative for rash.  Allergic/Immunologic: Negative.   Neurological: Negative for weakness, light-headedness and numbness.  Psychiatric/Behavioral: Negative for dysphoric mood and suicidal ideas.    Objective:  BP 115/66 (BP Location: Right Arm, Patient Position: Sitting, Cuff Size: Large)   Pulse 76   Temp 98.8 F (37.1 C) (Oral)   Ht  5\' 8"  (1.727 m)   Wt (!) 393 lb 3.2 oz (178.4 kg)   SpO2 96%   BMI 59.79 kg/m   BP/Weight 05/20/2016 05/14/2016 [id number]  Systolic BP [id number]  Diastolic BP 66 74 73  Wt. (Lbs) 393.2 387 315  BMI 59.79 58.84 47.9      Physical Exam  Constitutional: He is oriented to person, place, and time. He appears well-developed and well-nourished.  Neck: Normal range of motion. Neck supple. No JVD present.  Cardiovascular: Normal rate, normal heart sounds and intact distal pulses.   No murmur heard. Pulmonary/Chest: Effort normal and breath sounds normal. He has no wheezes. He has no rales. He exhibits no tenderness.  Abdominal: Soft. Bowel sounds are normal. He exhibits no distension and no mass. There is no tenderness.  Musculoskeletal: Normal range of motion. He exhibits edema (1+ L>R) and tenderness (mild tenderness in right shoulder  90).  Neurological: He is alert and oriented to person, place, and time.  Skin: There is erythema (purplish red dioloration of lateral aspects of both lower extremities).  Psychiatric: He has a normal mood and affect.     Assessment & Plan:   1. Atrial fibrillation, unspecified type (HCC) Supratherapeutic INR 3.5 On anticoagulation with Coumadin D/c Amiodarone as per Cardiology CHA2DS2-VASc score of 1 ( Hypertension) Patient has been taking saw palmetto which could be responsible Advised to discontinue saw palmetto, advised against NSAID use He will be seeing clinical pharmacist soon to review INR - INR  2. Anxiety and depression Stable - FLUoxetine (PROZAC) 20 MG capsule; Take 1 capsule (20 mg total) by mouth daily.  Dispense: 30 capsule; Refill: 3  3. Essential hypertension Controlled - carvedilol (COREG) 12.5 MG tablet; Take 1 tablet (12.5 mg total) by mouth 2 (two) times daily with a meal.  Dispense: 60 tablet; Refill: 3  4. Lymphedema Continue to use of compression stockings Also taking potassium capsules - furosemide (LASIX) 40 MG  tablet; Take 1 tablet (40 mg total) by mouth 2 (two) times daily.  Dispense: 30 tablet; Refill: 3  5. Benign prostatic hyperplasia with urinary frequency Discontinue Saw palmetto - tamsulosin (FLOMAX) 0.4 MG CAPS capsule; Take 1 capsule (0.4 mg total) by mouth daily.  Dispense: 30 capsule; Refill: 3  6. Purplish red color We'll need to evaluate for peripheral arterial disease; he does have good pulses Need to refer to vascular surgeon however he has no [place] discount her medical coverage Advised to give [place] a call once he obtains this referral so can be placed.  7. Right shoulder pain Possibly underlying osteoarthritis Unable to take NSAIDs due to increased risk of bleeding with Coumadin Advised to use Tylenol; if this does not help we will try tramadol.  Meds ordered this encounter  Medications  . FLUoxetine (PROZAC) 20 MG capsule    Sig: Take 1 capsule (20 mg total) by mouth daily.    Dispense:  30 capsule    Refill:  3  .  carvedilol (COREG) 12.5 MG tablet    Sig: Take 1 tablet (12.5 mg total) by mouth 2 (two) times daily with a meal.    Dispense:  60 tablet    Refill:  3  . furosemide (LASIX) 40 MG tablet    Sig: Take 1 tablet (40 mg total) by mouth 2 (two) times daily.    Dispense:  30 tablet    Refill:  3  . potassium chloride (K-DUR) 10 MEQ tablet    Sig: Take 2 tablets (20 mEq total) by mouth 2 (two) times daily.    Dispense:  120 tablet    Refill:  3  . tamsulosin (FLOMAX) 0.4 MG CAPS capsule    Sig: Take 1 capsule (0.4 mg total) by mouth daily.    Dispense:  30 capsule    Refill:  3    Follow-up: Return in about 3 months (around 08/17/2016) for follow up on chronic medical condition.   Arnoldo Morale MD

## 2016-05-20 NOTE — Telephone Encounter (Signed)
Writer called patient to let him know that MD ok'd the request for doubling up on his prozac and sent a prescription to the pharmacy.

## 2016-05-20 NOTE — Telephone Encounter (Signed)
Done

## 2016-05-20 NOTE — Telephone Encounter (Signed)
Pt. Called requesting to speak with his nurse regarding his medication. Pt. Would like to have his FLUoxetine (PROZAC) 20 MG capsule  Changed. Pt. Would like to take 2 pills a day instead of one.  Please f/u with pt.

## 2016-05-21 LAB — MAGNESIUM: Magnesium: 2.2 mg/dL (ref 1.5–2.5)

## 2016-05-21 MED FILL — ?WARFARIN SODIUM 5 MG TABLE: 5 | 30 days supply | Qty: 60 | Fill #2

## 2016-05-22 ENCOUNTER — Telehealth: Payer: Self-pay | Admitting: Cardiovascular Disease

## 2016-05-22 NOTE — Telephone Encounter (Signed)
Telephone note from 2/6 referencing patient questions about decreasing/discontinuing amiodarone

## 2016-05-22 NOTE — Telephone Encounter (Signed)
Patient needs Rx for amiodarone sent to [facility] Telephone note from 2/6 references a decreased dose of this, but MD note from 2/6 states he could stop it   Patient would like to know if he should remain on this or discontinue

## 2016-05-22 NOTE — Telephone Encounter (Signed)
New Message  Pt call requesting to speak with RN. Pt states he has not received refills for medication. Please call back to discuss

## 2016-05-22 NOTE — Telephone Encounter (Signed)
Please review for refill. Thanks!  

## 2016-05-23 MED ORDER — AMIODARONE HCL 100 MG PO TABS: 100.0000 mg | ORAL_TABLET | Freq: Every day | ORAL | 5 refills | Status: DC

## 2016-05-23 NOTE — Telephone Encounter (Signed)
Advised patient, verbalized understanding  

## 2016-05-23 NOTE — Telephone Encounter (Signed)
Spoke with pt, he is wanting to know about amiodarone. Dr Oval Linsey is reviewing and Rip Harbour will call the patient back.

## 2016-05-23 NOTE — Telephone Encounter (Signed)
Left message to call back  

## 2016-05-23 NOTE — Telephone Encounter (Signed)
Follow Up    Pt is following up   *STAT* If patient is at the pharmacy, call can be transferred to refill team.   1. Which medications need to be refilled? (please list name of each medication and dose if known) amiodarone (PACERONE) 200 MG tablet  2. Which pharmacy/location (including street and city if local pharmacy) is medication to be sent to?  [facility]  3. Do they need a 30 day or 90 day supply? 30 day supply

## 2016-05-23 NOTE — Telephone Encounter (Signed)
Amiodarone 100 mg daily until follow up

## 2016-05-23 NOTE — Telephone Encounter (Signed)
  Advised patient, verbalized understanding     Me     4:36 PM  Note    Left message to call back     Skeet Latch, MD  to Me    4:24 PM  Note    Amiodarone 100 mg daily until follow up    May 15, 2016       2:19 PM  You routed this conversation to Skeet Latch, MD  Me     2:09 PM  Note    Spoke with patient and stated that within the last 30 days he tried to decrease the Amiodarone to 1/2-1/4 daily secondary to not being able to pick up Rx During that time he felt "funny". When asked to describe what funny meant stated he had a tightness in his chest during the time he decreased dose. Denied any fast or irregular heart beat that he could tell. Tightness not with exertion  Will forward to Dr Oval Linsey for review

## 2016-05-24 ENCOUNTER — Encounter: Payer: Self-pay | Admitting: Pulmonary Disease

## 2016-05-24 ENCOUNTER — Ambulatory Visit (INDEPENDENT_AMBULATORY_CARE_PROVIDER_SITE_OTHER): Payer: Self-pay | Admitting: Pulmonary Disease

## 2016-05-24 VITALS — BP 136/64 | HR 79 | Ht 68.0 in | Wt 394.0 lb

## 2016-05-24 DIAGNOSIS — G4733 Obstructive sleep apnea (adult) (pediatric): Secondary | ICD-10-CM

## 2016-05-24 MED FILL — AMIODARONE HCL 200 MG TAB: 200 | 30 days supply | Qty: 15 | Fill #0

## 2016-05-24 NOTE — Telephone Encounter (Signed)
Left message to call back  

## 2016-05-24 NOTE — Progress Notes (Signed)
Subjective:    Patient ID: Scott Shelton, male    DOB: 12/27/1950, 66 y.o.   MRN: [id number]  Synopsis: Evaluated for pulmonary hypertension after sub-massive pulmonary embolism in February 2017. This was provoked by a January 2017 hospitalization for sepsis, shock, and renal failure requiring temporary dialysis. He also has atrial fibrillation which has been treated by cardiology in 2017. February 2017 echocardiogram showed an LVEF of 55-60% with normal wall motion, mildly calcified aortic valve, mild mitral regurg, mildly reduced systolic right ventricular function with an RVSP of 55 mmHg Cardiology records reviewed from March 2017 where he was evaluated for his A. fib and pulmonary hypertension. February 2017 CT angiogram chest images personally reviewed showing a saber shaped trachea, 3.9 mm right lower lobe pulmonary nodule, and bilateral pulmonary emboli with significant clot burden and RV strain pattern. April 2017 pulmonary function testing ratio 81%, FEV1 2.91 L (78% predicted), FVC 3.57 L (72% predicted), total lung capacity 6.51 (89% predicted), DLCO 25.18 (73% predicted). August 2017 echocardiogram shows normal LVEF, RV size and function normal, no evidence of pulmonary hypertension June 2017 polysomnogram AHI 18, O2 sat duration less than 90% for only 4.7% of the evening  HPI Chief Complaint  Patient presents with  . Follow-up    pt doing well, notes sob with exertion.      Mr. Kaplan had a bad case of a viral gastroenteritis in December and is now.    He says that he has been active, has not been outside.  No breathing complaints, no cough.  He does not have CPAP qHS.  He says that he is now taking amiodarone at a lower dose.  He is sleeping well right now, feels well rested, doesn't wake up.    Otherwise he is doing well  Past Medical History:  Diagnosis Date  . A-fib ([place])   . Bilateral leg edema   . Chronic kidney disease    STAGE III  . GERD (gastroesophageal reflux  disease)   . History of cellulitis 05/21/15   BLE  . Hx pulmonary embolism    with hypoxic respiratory failure  . Pulmonary hypertension       Review of Systems  Constitutional: Negative for chills, fatigue and fever.  HENT: Negative for sinus pressure, sneezing and sore throat.   Respiratory: Negative for cough, shortness of breath and wheezing.   Cardiovascular: Positive for leg swelling. Negative for chest pain and palpitations.       Objective:   Physical Exam Vitals:   05/24/16 1101  BP: 136/64  Pulse: 79  SpO2: 96%  Weight: (!) 394 lb (178.7 kg)  Height: 5\' 8"  (1.727 m)   RA  Gen: chronically ill appearing, obese HENT: OP clear, TM's clear, neck supple PULM: CTA B, normal percussion CV: RRR, no mgr, trace edema GI: BS+, soft, nontender Derm: no cyanosis or rash Psyche: normal mood and affect    CBC    Component Value Date/Time   WBC 4.8 06/01/2015 0510   RBC 2.96 (L) 06/01/2015 0510   HGB 8.9 (L) 06/01/2015 0510   HCT 27.2 (L) 06/01/2015 0510   HCT 29.7 (L) 06/01/2015 0510   PLT 193 06/01/2015 0510   MCV 100.3 (H) 06/01/2015 0510   MCH 30.1 06/01/2015 0510   MCHC 30.0 06/01/2015 0510   RDW 15.7 (H) 06/01/2015 0510   LYMPHSABS 1.2 05/27/2015 1655   MONOABS 0.8 05/27/2015 1655   EOSABS 0.1 05/27/2015 1655   BASOSABS 0.0 05/27/2015 1655  Assessment & Plan:  OSA (obstructive sleep apnea) He has untreated sleep apnea. He is not taking CPAP because he can't afford the machine. Unfortunately he has not lost any weight since the last visit. So I think trying to manage his sleep apnea with weight losses, be a challenge. Further, with his recent A. fib it's imperative that we treat the sleep apnea to prevent him from going back into this.  He is willing to use CPAP if he can find an affordable option.  Plan: We will request financial assistance for CPAP Follow-up one year Counseled on the importance of exercise and weight loss No driving when  feeling sleepy    Current Outpatient Prescriptions:  .  amiodarone (PACERONE) 100 MG tablet, Take 1 tablet (100 mg total) by mouth daily., Disp: 30 tablet, Rfl: 5 .  carvedilol (COREG) 12.5 MG tablet, Take 1 tablet (12.5 mg total) by mouth 2 (two) times daily with a meal., Disp: 60 tablet, Rfl: 3 .  FLUoxetine (PROZAC) 20 MG capsule, Take 2 capsules (40 mg total) by mouth daily., Disp: 60 capsule, Rfl: 3 .  furosemide (LASIX) 40 MG tablet, Take 1 tablet (40 mg total) by mouth 2 (two) times daily., Disp: 30 tablet, Rfl: 3 .  potassium chloride (K-DUR) 10 MEQ tablet, Take 2 tablets (20 mEq total) by mouth 2 (two) times daily. (Patient taking differently: Take 20 mEq by mouth daily. ), Disp: 120 tablet, Rfl: 3 .  tamsulosin (FLOMAX) 0.4 MG CAPS capsule, Take 1 capsule (0.4 mg total) by mouth daily., Disp: 30 capsule, Rfl: 3 .  traZODone (DESYREL) 50 MG tablet, Take 1 tablet (50 mg total) by mouth at bedtime., Disp: 30 tablet, Rfl: 3 .  warfarin (COUMADIN) 5 MG tablet, 7.5 mg daily except on Mondays and Wednesdays when you will take 5 mg., Disp: 60 tablet, Rfl: 2

## 2016-05-24 NOTE — Assessment & Plan Note (Signed)
He has untreated sleep apnea. He is not taking CPAP because he can't afford the machine. Unfortunately he has not lost any weight since the last visit. So I think trying to manage his sleep apnea with weight losses, be a challenge. Further, with his recent A. fib it's imperative that we treat the sleep apnea to prevent him from going back into this.  He is willing to use CPAP if he can find an affordable option.  Plan: We will request financial assistance for CPAP Follow-up one year Counseled on the importance of exercise and weight loss No driving when feeling sleepy

## 2016-05-24 NOTE — Patient Instructions (Signed)
We will try to make arrangements for financial assistance for CPAP machine. We will see you back in one year or sooner if needed Exercise regularly try to lose weight

## 2016-05-27 NOTE — Telephone Encounter (Signed)
Left message to call back from [facility]- may talk to any triage

## 2016-05-29 NOTE — Telephone Encounter (Signed)
Encounter closed ,no reapply

## 2016-05-31 ENCOUNTER — Telehealth: Payer: Self-pay | Admitting: *Deleted

## 2016-05-31 NOTE — Telephone Encounter (Signed)
-----   Message from Arnoldo Morale, MD sent at 05/21/2016  1:07 PM EST ----- Labs are stable

## 2016-05-31 NOTE — Telephone Encounter (Signed)
Patient verified DOB Patient is aware of labs being stable. Patient expressed his understanding and had no further questions.

## 2016-06-04 ENCOUNTER — Ambulatory Visit: Payer: Self-pay | Attending: Family Medicine | Admitting: Pharmacist

## 2016-06-04 DIAGNOSIS — I2692 Saddle embolus of pulmonary artery without acute cor pulmonale: Secondary | ICD-10-CM

## 2016-06-04 DIAGNOSIS — I4891 Unspecified atrial fibrillation: Secondary | ICD-10-CM

## 2016-06-04 LAB — POCT INR: INR: 2.3

## 2016-06-04 MED FILL — FLUoxetine HCL 20 MG CAPS: 20 | 30 days supply | Qty: 60 | Fill #0

## 2016-06-17 ENCOUNTER — Other Ambulatory Visit: Payer: Self-pay | Admitting: Family Medicine

## 2016-06-17 DIAGNOSIS — I2692 Saddle embolus of pulmonary artery without acute cor pulmonale: Secondary | ICD-10-CM

## 2016-06-17 MED FILL — FUROSEMIDE 40 MG TABLET: 40 | 15 days supply | Qty: 30 | Fill #1

## 2016-06-17 MED FILL — ?TAMSULOSIN HCL 0.4 MG CAP: 0.4 | 30 days supply | Qty: 30 | Fill #1

## 2016-06-17 MED FILL — CARVEDILOL 12.5 MG TABLET: 12.5 | 30 days supply | Qty: 60 | Fill #1

## 2016-06-17 MED FILL — AMIODARONE HCL 200 MG TAB: 200 | 30 days supply | Qty: 15 | Fill #1

## 2016-06-18 MED FILL — ?WARFARIN SODIUM 5 MG TABLE: 5 | 30 days supply | Qty: 60 | Fill #0

## 2016-06-25 ENCOUNTER — Ambulatory Visit: Payer: Self-pay | Attending: Family Medicine | Admitting: Pharmacist

## 2016-06-25 DIAGNOSIS — I4891 Unspecified atrial fibrillation: Secondary | ICD-10-CM

## 2016-06-25 DIAGNOSIS — I2692 Saddle embolus of pulmonary artery without acute cor pulmonale: Secondary | ICD-10-CM

## 2016-06-25 DIAGNOSIS — I2782 Chronic pulmonary embolism: Secondary | ICD-10-CM

## 2016-06-25 LAB — POCT INR: INR: 1.9

## 2016-07-01 MED FILL — FLUoxetine HCL 20 MG CAPS: 20 | 30 days supply | Qty: 60 | Fill #1

## 2016-07-03 ENCOUNTER — Ambulatory Visit: Payer: Self-pay | Attending: Family Medicine | Admitting: Pharmacist

## 2016-07-03 DIAGNOSIS — I2692 Saddle embolus of pulmonary artery without acute cor pulmonale: Secondary | ICD-10-CM

## 2016-07-03 DIAGNOSIS — I4891 Unspecified atrial fibrillation: Secondary | ICD-10-CM

## 2016-07-03 LAB — POCT INR: INR: 1.7

## 2016-07-11 ENCOUNTER — Ambulatory Visit: Payer: Self-pay | Attending: Family Medicine | Admitting: Pharmacist

## 2016-07-11 DIAGNOSIS — I2692 Saddle embolus of pulmonary artery without acute cor pulmonale: Secondary | ICD-10-CM

## 2016-07-11 DIAGNOSIS — I4891 Unspecified atrial fibrillation: Secondary | ICD-10-CM

## 2016-07-11 DIAGNOSIS — I2782 Chronic pulmonary embolism: Secondary | ICD-10-CM

## 2016-07-11 LAB — POCT INR: INR: 2

## 2016-07-22 MED FILL — FLUoxetine HCL 20 MG CAPS: 20 | 30 days supply | Qty: 60 | Fill #2

## 2016-07-22 MED FILL — CARVEDILOL 12.5 MG TABLET: 12.5 | 30 days supply | Qty: 60 | Fill #2

## 2016-07-22 MED FILL — FUROSEMIDE 40 MG TABLET: 40 | 15 days supply | Qty: 30 | Fill #2

## 2016-07-22 MED FILL — POTASSIUM CL 10 MEQ TAB SA: 10 | 30 days supply | Qty: 120 | Fill #1

## 2016-07-22 MED FILL — TAMSULOSIN HCL 0.4 MG CAP: 0.4 | 30 days supply | Qty: 30 | Fill #2

## 2016-07-22 MED FILL — AMIODARONE HCL 200 MG TAB: 200 | 30 days supply | Qty: 15 | Fill #2

## 2016-07-25 ENCOUNTER — Ambulatory Visit: Payer: Self-pay | Attending: Family Medicine | Admitting: Pharmacist

## 2016-07-25 DIAGNOSIS — I4891 Unspecified atrial fibrillation: Secondary | ICD-10-CM

## 2016-07-25 DIAGNOSIS — I2692 Saddle embolus of pulmonary artery without acute cor pulmonale: Secondary | ICD-10-CM

## 2016-07-25 LAB — POCT INR: INR: 3.1

## 2016-08-08 ENCOUNTER — Ambulatory Visit: Payer: Self-pay | Attending: Family Medicine | Admitting: Pharmacist

## 2016-08-08 DIAGNOSIS — I2692 Saddle embolus of pulmonary artery without acute cor pulmonale: Secondary | ICD-10-CM

## 2016-08-08 DIAGNOSIS — I4891 Unspecified atrial fibrillation: Secondary | ICD-10-CM

## 2016-08-08 LAB — POCT INR: INR: 2.5

## 2016-08-15 ENCOUNTER — Encounter: Payer: Self-pay | Admitting: Cardiovascular Disease

## 2016-08-15 ENCOUNTER — Ambulatory Visit (INDEPENDENT_AMBULATORY_CARE_PROVIDER_SITE_OTHER): Payer: Self-pay | Admitting: Cardiovascular Disease

## 2016-08-15 VITALS — BP 116/76 | HR 68 | Ht 68.0 in | Wt 399.0 lb

## 2016-08-15 DIAGNOSIS — I890 Lymphedema, not elsewhere classified: Secondary | ICD-10-CM

## 2016-08-15 DIAGNOSIS — I480 Paroxysmal atrial fibrillation: Secondary | ICD-10-CM

## 2016-08-15 DIAGNOSIS — I10 Essential (primary) hypertension: Secondary | ICD-10-CM

## 2016-08-15 DIAGNOSIS — I493 Ventricular premature depolarization: Secondary | ICD-10-CM

## 2016-08-15 NOTE — Progress Notes (Signed)
Cardiology Office Note   Date:  08/15/2016   ID:  Scott Shelton, DOB 1950-10-12, MRN [id number]  PCP:  Scott Morale, MD  Cardiologist:   Scott Latch, MD  Pulmonologist: Dr. Lake Shelton  Chief Complaint  Patient presents with  . Follow-up  . Shortness of Breath    when exerting self.     History of Present Illness: Scott Shelton is a 66 y.o. male with paroxysmal atrial fibrillation, prior PE, OSA not on CPAP, melanoma s/p excision, and GERD who presents for follow-up.  Scott Shelton was hospitalized February 2016 with atrial fibrillation.  He developed septic shock likely due to lower external any cellulitis. He then had an episode of syncope and was brought to the emergency department via EMS. He was found to have bilateral pulmonary emboli.  During that hospitalization he had intermittent episodes of atrial fibrillation. He was anticoagulated with warfarin.  Echo revealed normal systolic function with a PASP of 57 mmHg.  He was discharged to [facility] for rehabilitation and lymphedema treatments. He was referred for a sleep study that revealed moderate sleep-disordered breathing.  However he has been unable to afford CPAP.  He has been feeling well and denies daytime somnolence or a.m. fatigue.  Scott Shelton had a repeat echo 11/07/15 that revealed LVEF  60-65% with a PASP 31 mmHg.  Since his last appointment Scott Shelton has been doing well.  his lymphedema has been much better. He is no longer requiring regular wrappings. At times he is able to skip his Lasix without accumulating much fluid in his legs. After his last appointment we recommended that he stop amiodarone. However, he was reluctant to do so because he was having chest discomfort. In retrospect. He thinks this was due to the [place] that he was taking at the time. Since stopping that medication is no longer had any chest discomfort. He notes that his breathing is getting much better and he denies orthopnea or PND. He  tries to push himself with exercise and is able to walk around without the use of his rolling walker at times.    Past Medical History:  Diagnosis Date  . A-fib ([place])   . Bilateral leg edema   . Chronic kidney disease    STAGE III  . GERD (gastroesophageal reflux disease)   . History of cellulitis 05/21/15   BLE  . Hx pulmonary embolism    with hypoxic respiratory failure  . Pulmonary hypertension ([place])     Past Surgical History:  Procedure Laterality Date  . CATARACT EXTRACTION    . MELANOMA EXCISION Left 08/17/2015   Procedure: WIDE EXCISION MELANOMA  OF THE BACK ;  Surgeon: [name], MD;  Location: [place];  Service: General;  Laterality: Left;     Current Outpatient Prescriptions  Medication Sig Dispense Refill  . carvedilol (COREG) 12.5 MG tablet Take 1 tablet (12.5 mg total) by mouth 2 (two) times daily with a meal. 60 tablet 3  . FLUoxetine (PROZAC) 20 MG capsule Take 2 capsules (40 mg total) by mouth daily. 60 capsule 3  . furosemide (LASIX) 40 MG tablet Take 1 tablet (40 mg total) by mouth 2 (two) times daily. 30 tablet 3  . potassium chloride (K-DUR) 10 MEQ tablet Take 2 tablets (20 mEq total) by mouth 2 (two) times daily. (Patient taking differently: Take 20 mEq by mouth daily. ) 120 tablet 3  . tamsulosin (FLOMAX) 0.4 MG CAPS capsule Take 1 capsule (0.4 mg total) by  mouth daily. 30 capsule 3  . traZODone (DESYREL) 50 MG tablet Take 1 tablet (50 mg total) by mouth at bedtime. 30 tablet 3  . warfarin (COUMADIN) 5 MG tablet TAKE 1 AND 1/2 TABLETS DAILY EXCEPT ON MONDAYS AND WEDNESDAYS TAKE 1 TABLET DAILY. 60 tablet 2   No current facility-administered medications for this visit.     Allergies:   Patient has no known allergies.    Social History:  The patient  reports that he has never smoked. He has never used smokeless tobacco. He reports that he does not drink alcohol or use drugs.   Family History:  The patient's family history includes  Cancer in his mother.    ROS:  Please see the history of present illness.   Otherwise, review of systems are positive for none.   All other systems are reviewed and negative.    PHYSICAL EXAM: VS:  BP 116/76   Pulse 68   Ht 5\' 8"  (1.727 m)   Wt (!) 181 kg (399 lb)   BMI 60.67 kg/m  , BMI Body mass index is 60.67 kg/m. GENERAL:  Well-appearing.  No acute distress. HEENT:  PERRLA NECK:  No jugular venous distention, waveform within normal limits, carotid upstroke brisk and symmetric, no bruits, rhonchi, or wheezes.  LUNGS:  Clear to auscultation bilaterally.   HEART:  RRR.  PMI not displaced or sustained,S1 and S2 within normal limits, no S3, no S4, no clicks, no rubs, no murmurs ABD:  Morbidly obese.  Positive bowel sounds normal in frequency in pitch, no bruits, no rebound, no guarding, no midline pulsatile mass, no hepatomegaly, no splenomegaly EXT:  2 plus pulses throughout, no cyanosis no clubbing. +Lymphedema much better than prior.  No compressive dressings SKIN:  No rashes no nodules NEURO:  Cranial nerves II through XII grossly intact, motor grossly intact throughout PSYCH:  Cognitively intact, oriented to person place and time   EKG:  EKG is not ordered today. 10/27/15: Sinus bradycardia. Rate 59 bpm. Nonspecific T wave abnormalities. 05/14/16: Sinus rhythm.  Rate 71 bpm.  PVCs.    Echo 05/29/15: Study Conclusions  - Left ventricle: The cavity size was normal. There was mild  concentric hypertrophy. Systolic function was normal. The  estimated ejection fraction was in the range of 55% to 60%. Wall  motion was normal; there were no regional wall motion  abnormalities. - Aortic valve: Trileaflet; mildly thickened, mildly calcified  leaflets. - Mitral valve: Calcified annulus. There was mild regurgitation. - Right ventricle: Systolic function was mildly reduced. - Pulmonic valve: There was trivial regurgitation. - Pulmonary arteries: PA peak pressure: 57 mm Hg  (S).  Impressions:  - The right ventricular systolic pressure was increased consistent  with moderate pulmonary hypertension.  Echo 11/07/15: LVEF 60-65^.  Grade 1 diastolic dysfunction. Mild tricuspid regurgitation.  PASP 31 mmHg.  Recent Labs: 11/21/2015: TSH 1.87 05/20/2016: ALT 16; BUN 20; Creat 1.34; Magnesium 2.2; Potassium 4.8; Sodium 139    Lipid Panel    Component Value Date/Time   CHOL 177 11/21/2015 0849   TRIG 220 (H) 11/21/2015 0849   HDL 36 (L) 11/21/2015 0849   CHOLHDL 4.9 11/21/2015 0849   VLDL 44 (H) 11/21/2015 0849   LDLCALC 97 11/21/2015 0849      Wt Readings from Last 3 Encounters:  08/15/16 (!) 181 kg (399 lb)  05/24/16 (!) 178.7 kg (394 lb)  05/20/16 (!) 178.4 kg (393 lb 3.2 oz)     ASSESSMENT AND PLAN:  #  Paroxysmal atrial fibrillation: Scott Shelton remains in sinus rhythm.  He has not experienced any recent palpitations.  Continue warfarin and discontinue amiodarone at this time. Continue carvedilol.  This patients CHA2DS2-VASc Score and unadjusted Ischemic Stroke Rate (% per year) is equal to 0.6 % stroke rate/year from a score of 1 Above score calculated as 1 point each if present [CHF, HTN, DM, Vascular=MI/PAD/Aortic Plaque, Age if 65-74, or Male] Above score calculated as 2 points each if present [Age > 75, or Stroke/TIA/TE]  # Hypertension: Scott Shelton blood pressure was initially elevated but better on repeat.  Continue carvedilol and lasix.  # Pulmonary hypertension: Resolved.   Current medicines are reviewed at length with the patient today.  The patient does not have concerns regarding medicines.  The following changes have been made:  no change  Labs/ tests ordered today include:   No orders of the defined types were placed in this encounter.    Disposition:   FU with Emarie Paul C. Oval Linsey, MD, [facility] in 6 months.   This note was written with the assistance of speech recognition software.  Please excuse any transcriptional  errors.  Signed, Keone Kamer C. Oval Linsey, MD, [facility]  08/15/2016 5:40 PM    [place]

## 2016-08-15 NOTE — Patient Instructions (Signed)
Medication Instructions:  STOP AMIODARONE   Labwork: NONE  Testing/Procedures: NONE  Follow-Up: Your physician wants you to follow-up in: [place] will receive a reminder letter in the mail two months in advance. If you don't receive a letter, please call our office to schedule the follow-up appointment.  If you need a refill on your cardiac medications before your next appointment, please call your pharmacy.

## 2016-08-20 MED FILL — ?CARVEDILOL 12.5 MG TABLET: 12.5 | 30 days supply | Qty: 60 | Fill #3

## 2016-08-20 MED FILL — TAMSULOSIN HCL 0.4 MG CAP: 0.4 | 30 days supply | Qty: 30 | Fill #3

## 2016-08-20 MED FILL — ?FUROSEMIDE 40 MG TABLET: 40 | 15 days supply | Qty: 30 | Fill #3

## 2016-08-20 MED FILL — ?WARFARIN SODIUM 5 MG TABLE: 5 | 30 days supply | Qty: 60 | Fill #1

## 2016-08-20 MED FILL — FLUoxetine HCL 20 MG CAPS: 20 | 30 days supply | Qty: 60 | Fill #3

## 2016-08-22 ENCOUNTER — Other Ambulatory Visit: Payer: Self-pay | Admitting: Family Medicine

## 2016-08-22 ENCOUNTER — Ambulatory Visit: Payer: Self-pay | Attending: Family Medicine | Admitting: Pharmacist

## 2016-08-22 DIAGNOSIS — I2692 Saddle embolus of pulmonary artery without acute cor pulmonale: Secondary | ICD-10-CM

## 2016-08-22 DIAGNOSIS — I890 Lymphedema, not elsewhere classified: Secondary | ICD-10-CM

## 2016-08-22 DIAGNOSIS — I2699 Other pulmonary embolism without acute cor pulmonale: Secondary | ICD-10-CM | POA: Insufficient documentation

## 2016-08-22 DIAGNOSIS — I4891 Unspecified atrial fibrillation: Secondary | ICD-10-CM

## 2016-08-22 LAB — POCT INR: INR: 3.3

## 2016-08-22 MED FILL — FUROSEMIDE 40 MG TABLET: 40 | 15 days supply | Qty: 30 | Fill #0

## 2016-09-03 MED FILL — ?FUROSEMIDE 40 MG TABLET: 40 | 15 days supply | Qty: 30 | Fill #1

## 2016-09-04 MED FILL — POTASSIUM CL 10 MEQ TAB SA: 10 | 17 days supply | Qty: 68 | Fill #2

## 2016-09-05 ENCOUNTER — Ambulatory Visit: Payer: Self-pay | Attending: Family Medicine | Admitting: Pharmacist

## 2016-09-05 DIAGNOSIS — I4891 Unspecified atrial fibrillation: Secondary | ICD-10-CM

## 2016-09-05 DIAGNOSIS — I2692 Saddle embolus of pulmonary artery without acute cor pulmonale: Secondary | ICD-10-CM

## 2016-09-05 LAB — POCT INR: INR: 3.5

## 2016-09-16 ENCOUNTER — Other Ambulatory Visit: Payer: Self-pay | Admitting: Family Medicine

## 2016-09-16 DIAGNOSIS — I890 Lymphedema, not elsewhere classified: Secondary | ICD-10-CM

## 2016-09-16 MED FILL — ?FUROSEMIDE 40 MG TABLET: 40 | 15 days supply | Qty: 30 | Fill #2

## 2016-09-19 ENCOUNTER — Encounter: Payer: Self-pay | Admitting: Pharmacist

## 2016-09-23 ENCOUNTER — Other Ambulatory Visit: Payer: Self-pay | Admitting: Family Medicine

## 2016-09-23 DIAGNOSIS — F329 Major depressive disorder, single episode, unspecified: Secondary | ICD-10-CM

## 2016-09-23 DIAGNOSIS — F419 Anxiety disorder, unspecified: Secondary | ICD-10-CM

## 2016-09-23 DIAGNOSIS — I2782 Chronic pulmonary embolism: Secondary | ICD-10-CM

## 2016-09-23 DIAGNOSIS — R350 Frequency of micturition: Secondary | ICD-10-CM

## 2016-09-23 DIAGNOSIS — I2692 Saddle embolus of pulmonary artery without acute cor pulmonale: Secondary | ICD-10-CM

## 2016-09-23 DIAGNOSIS — I10 Essential (primary) hypertension: Secondary | ICD-10-CM

## 2016-09-23 DIAGNOSIS — N401 Enlarged prostate with lower urinary tract symptoms: Secondary | ICD-10-CM

## 2016-09-23 MED FILL — ?WARFARIN SODIUM 5 MG TABLE: 5 | 30 days supply | Qty: 60 | Fill #2

## 2016-09-23 MED FILL — TAMSULOSIN HCL 0.4 MG CAP: 0.4 | 30 days supply | Qty: 30 | Fill #0

## 2016-09-23 MED FILL — CARVEDILOL 12.5 MG TABLET: 12.5 | 30 days supply | Qty: 60 | Fill #0

## 2016-09-23 MED FILL — ?FLUOXETINE HCL 20 MG CAP: 20 | 30 days supply | Qty: 60 | Fill #0

## 2016-09-26 ENCOUNTER — Ambulatory Visit: Payer: Self-pay | Attending: Family Medicine | Admitting: Pharmacist

## 2016-09-26 ENCOUNTER — Other Ambulatory Visit: Payer: Self-pay

## 2016-09-26 DIAGNOSIS — I4891 Unspecified atrial fibrillation: Secondary | ICD-10-CM

## 2016-09-26 DIAGNOSIS — Z029 Encounter for administrative examinations, unspecified: Secondary | ICD-10-CM | POA: Insufficient documentation

## 2016-09-26 DIAGNOSIS — I2692 Saddle embolus of pulmonary artery without acute cor pulmonale: Secondary | ICD-10-CM

## 2016-09-26 DIAGNOSIS — G4733 Obstructive sleep apnea (adult) (pediatric): Secondary | ICD-10-CM

## 2016-09-26 LAB — POCT INR: INR: 2.5

## 2016-09-27 ENCOUNTER — Other Ambulatory Visit: Payer: Self-pay | Admitting: Family Medicine

## 2016-09-27 DIAGNOSIS — I890 Lymphedema, not elsewhere classified: Secondary | ICD-10-CM

## 2016-10-10 ENCOUNTER — Encounter: Payer: Self-pay | Admitting: Pharmacist

## 2016-10-14 ENCOUNTER — Ambulatory Visit (HOSPITAL_BASED_OUTPATIENT_CLINIC_OR_DEPARTMENT_OTHER): Payer: Self-pay | Attending: Pulmonary Disease | Admitting: Radiology

## 2016-10-14 DIAGNOSIS — G4733 Obstructive sleep apnea (adult) (pediatric): Secondary | ICD-10-CM

## 2016-10-14 MED FILL — FUROSEMIDE 40 MG TABLET: 40 | 15 days supply | Qty: 30 | Fill #0

## 2016-10-15 ENCOUNTER — Encounter: Payer: Self-pay | Admitting: Pharmacist

## 2016-10-17 ENCOUNTER — Ambulatory Visit: Payer: Self-pay | Attending: Family Medicine | Admitting: Pharmacist

## 2016-10-17 DIAGNOSIS — I2699 Other pulmonary embolism without acute cor pulmonale: Secondary | ICD-10-CM | POA: Insufficient documentation

## 2016-10-17 DIAGNOSIS — I2692 Saddle embolus of pulmonary artery without acute cor pulmonale: Secondary | ICD-10-CM

## 2016-10-17 DIAGNOSIS — I4891 Unspecified atrial fibrillation: Secondary | ICD-10-CM

## 2016-10-17 LAB — POCT INR: INR: 2.4

## 2016-10-21 ENCOUNTER — Other Ambulatory Visit: Payer: Self-pay | Admitting: Family Medicine

## 2016-10-21 DIAGNOSIS — N401 Enlarged prostate with lower urinary tract symptoms: Secondary | ICD-10-CM

## 2016-10-21 DIAGNOSIS — F419 Anxiety disorder, unspecified: Secondary | ICD-10-CM

## 2016-10-21 DIAGNOSIS — F329 Major depressive disorder, single episode, unspecified: Secondary | ICD-10-CM

## 2016-10-21 DIAGNOSIS — R350 Frequency of micturition: Principal | ICD-10-CM

## 2016-10-21 MED FILL — TAMSULOSIN HCL 0.4 MG CAP: 0.4 | 30 days supply | Qty: 30 | Fill #0

## 2016-10-21 MED FILL — ?CARVEDILOL 12.5 MG TABLET: 12.5 | 30 days supply | Qty: 60 | Fill #2

## 2016-10-21 MED FILL — FLUoxetine HCL 20 MG CAPS: 20 | 30 days supply | Qty: 60 | Fill #0

## 2016-10-25 ENCOUNTER — Encounter: Payer: Self-pay | Admitting: Family Medicine

## 2016-10-25 ENCOUNTER — Ambulatory Visit: Payer: Self-pay | Attending: Family Medicine | Admitting: Family Medicine

## 2016-10-25 VITALS — BP 135/64 | HR 70 | Temp 98.3°F | Resp 20 | Ht 66.0 in | Wt >= 6400 oz

## 2016-10-25 DIAGNOSIS — I10 Essential (primary) hypertension: Secondary | ICD-10-CM | POA: Insufficient documentation

## 2016-10-25 DIAGNOSIS — G4733 Obstructive sleep apnea (adult) (pediatric): Secondary | ICD-10-CM | POA: Insufficient documentation

## 2016-10-25 DIAGNOSIS — Z86711 Personal history of pulmonary embolism: Secondary | ICD-10-CM | POA: Insufficient documentation

## 2016-10-25 DIAGNOSIS — K219 Gastro-esophageal reflux disease without esophagitis: Secondary | ICD-10-CM | POA: Insufficient documentation

## 2016-10-25 DIAGNOSIS — I890 Lymphedema, not elsewhere classified: Secondary | ICD-10-CM | POA: Insufficient documentation

## 2016-10-25 DIAGNOSIS — R350 Frequency of micturition: Secondary | ICD-10-CM

## 2016-10-25 DIAGNOSIS — Z7901 Long term (current) use of anticoagulants: Secondary | ICD-10-CM | POA: Insufficient documentation

## 2016-10-25 DIAGNOSIS — F329 Major depressive disorder, single episode, unspecified: Secondary | ICD-10-CM | POA: Insufficient documentation

## 2016-10-25 DIAGNOSIS — I480 Paroxysmal atrial fibrillation: Secondary | ICD-10-CM | POA: Insufficient documentation

## 2016-10-25 DIAGNOSIS — I129 Hypertensive chronic kidney disease with stage 1 through stage 4 chronic kidney disease, or unspecified chronic kidney disease: Secondary | ICD-10-CM | POA: Insufficient documentation

## 2016-10-25 DIAGNOSIS — N401 Enlarged prostate with lower urinary tract symptoms: Secondary | ICD-10-CM | POA: Insufficient documentation

## 2016-10-25 DIAGNOSIS — F419 Anxiety disorder, unspecified: Secondary | ICD-10-CM | POA: Insufficient documentation

## 2016-10-25 DIAGNOSIS — I272 Pulmonary hypertension, unspecified: Secondary | ICD-10-CM | POA: Insufficient documentation

## 2016-10-25 DIAGNOSIS — E876 Hypokalemia: Secondary | ICD-10-CM | POA: Insufficient documentation

## 2016-10-25 DIAGNOSIS — N183 Chronic kidney disease, stage 3 (moderate): Secondary | ICD-10-CM | POA: Insufficient documentation

## 2016-10-25 MED ORDER — ATORVASTATIN CALCIUM 20 MG PO TABS: 20.0000 mg | ORAL_TABLET | Freq: Every day | ORAL | 5 refills | Status: DC

## 2016-10-25 MED ORDER — CARVEDILOL 12.5 MG PO TABS: ORAL_TABLET | ORAL | 5 refills | Status: DC

## 2016-10-25 MED ORDER — TAMSULOSIN HCL 0.4 MG PO CAPS: 0.4000 mg | ORAL_CAPSULE | Freq: Every day | ORAL | 5 refills | Status: DC

## 2016-10-25 MED ORDER — POTASSIUM CHLORIDE ER 20 MEQ PO TBCR: 20.0000 meq | EXTENDED_RELEASE_TABLET | Freq: Every day | ORAL | 5 refills | Status: DC

## 2016-10-25 MED ORDER — FLUOXETINE HCL 20 MG PO CAPS: 40.0000 mg | ORAL_CAPSULE | Freq: Every day | ORAL | 5 refills | Status: DC

## 2016-10-25 MED ORDER — FUROSEMIDE 40 MG PO TABS
40.0000 mg | ORAL_TABLET | Freq: Two times a day (BID) | ORAL | 5 refills | Status: DC
Start: 2016-10-25 — End: 2017-01-28

## 2016-10-25 MED FILL — ATORVASTATIN 20 MG TABLET: 20 | 30 days supply | Qty: 30 | Fill #0

## 2016-10-25 MED FILL — ?WARFARIN SODIUM 5 MG TABLE: 5 | 30 days supply | Qty: 60 | Fill #0

## 2016-10-25 MED FILL — POTASSIUM CL 10 MEQ TAB SA: 10 | 8 days supply | Qty: 32 | Fill #3

## 2016-10-25 MED FILL — FUROSEMIDE 40 MG TABLET: 40 | 15 days supply | Qty: 30 | Fill #1

## 2016-10-25 NOTE — Progress Notes (Signed)
Subjective:  Patient ID: Scott Shelton, male    DOB: 1950-04-19  Age: 66 y.o. MRN: [id number]  CC: Follow-up   HPI Scott Shelton is a 66 year old male with a history of paroxysmal atrial fibrillation (on Coumadin), lymphedema, h/o pulmonary embolism, obstructive sleep apnea who comes into the clinic for a follow-up visit.  He informs me he is currently on the Atkins diet and has lost a couple of pounds; he tries to exercise the best he can but this is limited by his weight and the fact that he ambulates with a walker.  He does have sleep apnea however the patient has been unable to afford a CPAP machine. He is working on weight loss.  Regarding his lymphedema he uses compression stockings and reports improvement in symptoms.  His INR is being checked by the clinical pharmacist; last INR was 2.4 on 10/17/16.  He ambulates with the aid of a walker, and this is because he gets short of breath on moderate exertion.  Past Medical History:  Diagnosis Date  . A-fib ([place])   . Bilateral leg edema   . Chronic kidney disease    STAGE III  . GERD (gastroesophageal reflux disease)   . History of cellulitis 05/21/15   BLE  . Hx pulmonary embolism    with hypoxic respiratory failure  . Pulmonary hypertension ([place])     Past Surgical History:  Procedure Laterality Date  . CATARACT EXTRACTION    . MELANOMA EXCISION Left 08/17/2015   Procedure: WIDE EXCISION MELANOMA  OF THE BACK ;  Surgeon: [name], MD;  Location: [place];  Service: General;  Laterality: Left;    No Known Allergies  .   Outpatient Medications Prior to Visit  Medication Sig Dispense Refill  . traZODone (DESYREL) 50 MG tablet Take 1 tablet (50 mg total) by mouth at bedtime. 30 tablet 3  . warfarin (COUMADIN) 5 MG tablet Take 1.5 tablets Mon, Wed, Fri, and 1 tablet daily all other days. 60 tablet 2  . carvedilol (COREG) 12.5 MG tablet TAKE 1 TABLET BY MOUTH 2 TIMES DAILY WITH A MEAL. 60 tablet 0    . FLUoxetine (PROZAC) 20 MG capsule TAKE 2 CAPSULES BY MOUTH DAILY. 60 capsule 0  . furosemide (LASIX) 40 MG tablet TAKE 1 TABLET BY MOUTH 2 TIMES DAILY. 30 tablet 2  . potassium chloride (K-DUR) 10 MEQ tablet Take 2 tablets (20 mEq total) by mouth 2 (two) times daily. (Patient taking differently: Take 20 mEq by mouth daily. ) 120 tablet 3  . tamsulosin (FLOMAX) 0.4 MG CAPS capsule TAKE 1 CAPSULE BY MOUTH DAILY. 30 capsule 0   No facility-administered medications prior to visit.     ROS Review of Systems Constitutional: Negative for activity change and appetite change.  HENT: Negative for sinus pressure and sore throat.   Eyes: Negative for visual disturbance.  Respiratory: Negative for cough, chest tightness and shortness of breath.   Cardiovascular: Negative for chest pain and leg swelling.  Gastrointestinal: Negative for abdominal distention, abdominal pain, constipation and diarrhea.  Endocrine: Negative.   Genitourinary: Negative for dysuria.  Musculoskeletal:       Shelton hpi  Skin: Negative for rash.  Allergic/Immunologic: Negative.   Neurological: Negative for weakness, light-headedness and numbness.  Psychiatric/Behavioral: Negative for dysphoric mood and suicidal ideas.   Objective:  BP 135/64 (BP Location: Left Arm, Patient Position: Sitting, Cuff Size: Normal)   Pulse 70   Temp 98.3 F (36.8 C) (Oral)  Resp 20   Ht _0  (1.676 m)   Wt (!) 406 lb (184.2 kg)   SpO2 96%   BMI 65.53 kg/m   BP/Weight 10/25/2016 08/15/2016 6/94/8546  Systolic BP 270 350 093  Diastolic BP 64 76 64  Wt. (Lbs) 406 399 394  BMI 65.53 60.67 59.91      Physical Exam Constitutional: He is oriented to person, place, and time. He appears well-developed and well-nourished.  Neck: Normal range of motion. Neck supple. No JVD present.  Cardiovascular: Normal rate, normal heart sounds and intact distal pulses.   No murmur heard. Pulmonary/Chest: Effort normal and breath sounds normal. He has  no wheezes. He has no rales. He exhibits no tenderness.  Abdominal: Soft. Bowel sounds are normal. He exhibits no distension and no mass. There is no tenderness.  Musculoskeletal: Normal range of motion. He exhibits edema (2+ L>R)Neurological: He is alert and oriented to person, place, and time.  Skin: There is erythema (purplish red dioloration of lateral aspects of both lower extremities).  Psychiatric: He has a normal mood and affect.   CMP Latest Ref Rng & Units 05/20/2016 11/21/2015 06/02/2015  Glucose 65 - 99 mg/dL 102(H) 97 95  BUN 7 - 25 mg/dL _1 Creatinine 0.70 - 1.25 mg/dL 1.34(H) 1.20 1.30(H)  Sodium 135 - 146 mmol/L 139 137 140  Potassium 3.5 - 5.3 mmol/L 4.8 4.4 3.1(L)  Chloride 98 - 110 mmol/L 106 103 106  CO2 20 - 31 mmol/L _2 Calcium 8.6 - 10.3 mg/dL 9.6 9.6 9.7  Total Protein 6.1 - 8.1 g/dL 6.3 6.1 -  Total Bilirubin 0.2 - 1.2 mg/dL 0.3 0.4 -  Alkaline Phos 40 - 115 U/L 68 65 -  AST 10 - 35 U/L 16 18 -  ALT 9 - 46 U/L 16 17 -    Lipid Panel     Component Value Date/Time   CHOL 177 11/21/2015 0849   TRIG 220 (H) 11/21/2015 0849   HDL 36 (L) 11/21/2015 0849   CHOLHDL 4.9 11/21/2015 0849   VLDL 44 (H) 11/21/2015 0849   LDLCALC 97 11/21/2015 0849      Assessment & Plan:   1. Essential hypertension Controlled Low-sodium diet - carvedilol (COREG) 12.5 MG tablet; TAKE 1 TABLET BY MOUTH 2 TIMES DAILY WITH A MEAL.  Dispense: 60 tablet; Refill: 5 - CMP14+EGFR - Lipid panel  2. Anxiety and depression Stable - FLUoxetine (PROZAC) 20 MG capsule; Take 2 capsules (40 mg total) by mouth daily.  Dispense: 60 capsule; Refill: 5  3. Benign prostatic hyperplasia with urinary frequency Controlled - tamsulosin (FLOMAX) 0.4 MG CAPS capsule; Take 1 capsule (0.4 mg total) by mouth daily.  Dispense: 30 capsule; Refill: 5  4. Lymphedema Use compression stockings We'll need to refer to vascular for evaluation once he obtains the [employer] discount -  furosemide (LASIX) 40 MG tablet; Take 1 tablet (40 mg total) by mouth 2 (two) times daily.  Dispense: 60 tablet; Refill: 5  5. Hypokalemia - potassium chloride 20 MEQ TBCR; Take 20 mEq by mouth daily.  Dispense: 30 tablet; Refill: 5   Meds ordered this encounter  Medications  . carvedilol (COREG) 12.5 MG tablet    Sig: TAKE 1 TABLET BY MOUTH 2 TIMES DAILY WITH A MEAL.    Dispense:  60 tablet    Refill:  5    Must have office visit for refills  . FLUoxetine (PROZAC) 20 MG capsule    Sig:  Take 2 capsules (40 mg total) by mouth daily.    Dispense:  60 capsule    Refill:  5  . tamsulosin (FLOMAX) 0.4 MG CAPS capsule    Sig: Take 1 capsule (0.4 mg total) by mouth daily.    Dispense:  30 capsule    Refill:  5  . furosemide (LASIX) 40 MG tablet    Sig: Take 1 tablet (40 mg total) by mouth 2 (two) times daily.    Dispense:  60 tablet    Refill:  5  . potassium chloride 20 MEQ TBCR    Sig: Take 20 mEq by mouth daily.    Dispense:  30 tablet    Refill:  5  . atorvastatin (LIPITOR) 20 MG tablet    Sig: Take 1 tablet (20 mg total) by mouth daily.    Dispense:  30 tablet    Refill:  5    Follow-up: Return in about 3 months (around 01/25/2017) for follow up of chronic medical conditions.   This note has been created with Surveyor, quantity. Any transcriptional errors are unintentional.    Arnoldo Morale MD

## 2016-10-25 NOTE — Patient Instructions (Signed)

## 2016-10-26 LAB — CMP14+EGFR
A/G RATIO: 1.9 (ref 1.2–2.2)
ALBUMIN: 4.3 g/dL (ref 3.6–4.8)
ALT: 18 IU/L (ref 0–44)
AST: 16 IU/L (ref 0–40)
Alkaline Phosphatase: 114 IU/L (ref 39–117)
BUN / CREAT RATIO: 15 (ref 10–24)
BUN: 18 mg/dL (ref 8–27)
Bilirubin Total: 0.4 mg/dL (ref 0.0–1.2)
CALCIUM: 10.3 mg/dL — AB (ref 8.6–10.2)
CO2: 21 mmol/L (ref 20–29)
Chloride: 103 mmol/L (ref 96–106)
Creatinine, Ser: 1.19 mg/dL (ref 0.76–1.27)
GFR calc Af Amer: 74 mL/min/{1.73_m2} (ref >59)
GFR, EST NON AFRICAN AMERICAN: 64 mL/min/{1.73_m2} (ref >59)
GLOBULIN, TOTAL: 2.3 g/dL (ref 1.5–4.5)
Glucose: 101 mg/dL — ABNORMAL HIGH (ref 65–99)
Potassium: 4.9 mmol/L (ref 3.5–5.2)
SODIUM: 139 mmol/L (ref 134–144)
Total Protein: 6.6 g/dL (ref 6.0–8.5)

## 2016-10-26 LAB — LIPID PANEL
CHOLESTEROL TOTAL: 209 mg/dL — AB (ref 100–199)
Chol/HDL Ratio: 6.3 ratio — ABNORMAL HIGH (ref 0.0–5.0)
HDL: 33 mg/dL — ABNORMAL LOW (ref >39)
LDL Calculated: 122 mg/dL — ABNORMAL HIGH (ref 0–99)
TRIGLYCERIDES: 272 mg/dL — AB (ref 0–149)
VLDL Cholesterol Cal: 54 mg/dL — ABNORMAL HIGH (ref 5–40)

## 2016-10-28 ENCOUNTER — Telehealth: Payer: Self-pay | Admitting: Family Medicine

## 2016-10-28 NOTE — Telephone Encounter (Signed)
Pt wants to talk to Dr Jarold Song Nurse  Regarding [place] medicine  . Please, call him thank you

## 2016-10-29 NOTE — Telephone Encounter (Signed)
-----   Message from Arnoldo Morale, MD sent at 10/28/2016  4:43 PM EDT ----- Cholesterol is elevated; given I had just commenced atorvastatin at his last visit and will need him to comply with this along with a low-cholesterol diet and exercise as much as he can.

## 2016-10-29 NOTE — Telephone Encounter (Signed)
Student RMA called patient and the patient confirmed his name and DOB and address. Student then told the patient that his cholesterol was elevated and to continue the low cholesterol diet and the medications and to exercise as much as possible.

## 2016-10-30 ENCOUNTER — Telehealth: Payer: Self-pay | Admitting: Pulmonary Disease

## 2016-10-30 DIAGNOSIS — G4733 Obstructive sleep apnea (adult) (pediatric): Secondary | ICD-10-CM

## 2016-10-30 NOTE — Telephone Encounter (Signed)
Spoke with patient. He stated that he had faxed paperwork to [facility] for his CPAP machine. He stated that fax was from [place]. He needs a new RX for the CPAP with auto settings. The previous order had a set pressure on it.   Rodena Piety, please advise if you have received a fax? Thanks.

## 2016-10-31 NOTE — Telephone Encounter (Signed)
The CPAP Assistant response letter was e-mailed to me and I printed a copy of this along with the original CPAP order and gave it to [place] on 10/23/2016. I ask her to talk to Dr. Lake Bells about this and see what could be done. The letter just stated that CPAP Assistant Program did not have any auto CPAP machines at this time and if the office could submit a new order for a certain pressure setting they would be able to get the patient the CPAP machine

## 2016-11-04 NOTE — Telephone Encounter (Signed)
Per BQ- needs cpap titration study to determine set pressure before new rx for cpap can be placed.  Pt called back, I explained this process to him.  Pt is aware and ok with proceeding in this fashion.  Order placed for cpap titration study.  Nothing further needed at this time.

## 2016-11-04 NOTE — Telephone Encounter (Signed)
LMOM TCB x1 to explain to patient that [place] was indeed received and that we are awaiting BQ's recommendations because per Anita's documentation the Program does not carry auto CPAP machines  Routing to [place] and BQ for follow up regarding CPAP Assistance Program not carrying any auto CPAP machines

## 2016-11-05 MED FILL — FUROSEMIDE 40 MG TABLET: 40 | 15 days supply | Qty: 30 | Fill #2

## 2016-11-06 MED FILL — POTASSIUM CL ER 20 MEQ TAB: 20 | 30 days supply | Qty: 30 | Fill #0

## 2016-11-07 ENCOUNTER — Other Ambulatory Visit: Payer: Self-pay

## 2016-11-07 DIAGNOSIS — G4733 Obstructive sleep apnea (adult) (pediatric): Secondary | ICD-10-CM

## 2016-11-11 MED FILL — TAMSULOSIN HCL 0.4 MG CAP: 0.4 | 30 days supply | Qty: 30 | Fill #0

## 2016-11-14 ENCOUNTER — Ambulatory Visit: Payer: Self-pay | Attending: Family Medicine | Admitting: Pharmacist

## 2016-11-14 DIAGNOSIS — I2782 Chronic pulmonary embolism: Secondary | ICD-10-CM

## 2016-11-14 DIAGNOSIS — I4891 Unspecified atrial fibrillation: Secondary | ICD-10-CM

## 2016-11-14 DIAGNOSIS — I2692 Saddle embolus of pulmonary artery without acute cor pulmonale: Secondary | ICD-10-CM

## 2016-11-14 LAB — POCT INR: INR: 2.5

## 2016-11-20 MED FILL — ?WARFARIN SODIUM 5 MG TABLE: 5 | 30 days supply | Qty: 60 | Fill #1

## 2016-11-20 MED FILL — ?ATORVASTATIN 20 MG TABLET: 20 | 30 days supply | Qty: 30 | Fill #1

## 2016-11-28 MED FILL — ?CARVEDILOL 12.5 MG TABLET: 12.5 | 30 days supply | Qty: 60 | Fill #0

## 2016-11-29 ENCOUNTER — Telehealth: Payer: Self-pay | Admitting: Pulmonary Disease

## 2016-11-29 NOTE — Telephone Encounter (Signed)
Spoke with Corene Cornea at [facility], advised that set pressure was ordered on 11/07/16.  Corene Cornea has pulled this order from Epic and will ensure that pt's cpap is set at this pressure.  Nothing further needed.

## 2016-12-02 MED FILL — POTASSIUM CL ER 20 MEQ TAB: 20 | 30 days supply | Qty: 30 | Fill #1

## 2016-12-07 MED FILL — ?TAMSULOSIN HCL 0.4 MG CAP: 0.4 | 30 days supply | Qty: 30 | Fill #1

## 2016-12-16 ENCOUNTER — Other Ambulatory Visit: Payer: Self-pay | Admitting: Family Medicine

## 2016-12-16 DIAGNOSIS — I2692 Saddle embolus of pulmonary artery without acute cor pulmonale: Secondary | ICD-10-CM

## 2016-12-16 MED FILL — FLUoxetine HCL 20 MG CAPS: 20 | 30 days supply | Qty: 60 | Fill #0

## 2016-12-16 MED FILL — ?ATORVASTATIN 20 MG TABLET: 20 | 30 days supply | Qty: 30 | Fill #2

## 2016-12-16 MED FILL — ?WARFARIN SODIUM 5 MG TABLE: 5 | 30 days supply | Qty: 60 | Fill #2

## 2016-12-17 ENCOUNTER — Ambulatory Visit: Payer: Self-pay | Attending: Family Medicine | Admitting: Pharmacist

## 2016-12-17 DIAGNOSIS — I2692 Saddle embolus of pulmonary artery without acute cor pulmonale: Secondary | ICD-10-CM

## 2016-12-17 DIAGNOSIS — I4891 Unspecified atrial fibrillation: Secondary | ICD-10-CM | POA: Insufficient documentation

## 2016-12-17 LAB — POCT INR: INR: 2.3

## 2016-12-17 MED FILL — FUROSEMIDE 40 MG TABLET: 40 | 30 days supply | Qty: 60 | Fill #0

## 2017-01-06 MED FILL — ?TAMSULOSIN HCL 0.4 MG CAP: 0.4 | 30 days supply | Qty: 30 | Fill #2

## 2017-01-13 MED FILL — FUROSEMIDE 40 MG TABLET: 40 | 30 days supply | Qty: 60 | Fill #1

## 2017-01-13 MED FILL — ?CARVEDILOL 12.5 MG TABLET: 12.5 | 30 days supply | Qty: 60 | Fill #1

## 2017-01-13 MED FILL — ?WARFARIN SODIUM 5 MG TABLE: 5 | 42 days supply | Qty: 60 | Fill #0

## 2017-01-13 MED FILL — FLUoxetine HCL 20 MG CAPS: 20 | 30 days supply | Qty: 60 | Fill #1

## 2017-01-13 MED FILL — ?ATORVASTATIN 20 MG TABLET: 20 | 30 days supply | Qty: 30 | Fill #3

## 2017-01-15 MED FILL — POTASSIUM CL ER 20 MEQ TAB: 20 | 30 days supply | Qty: 30 | Fill #2

## 2017-01-28 ENCOUNTER — Encounter: Payer: Self-pay | Admitting: Family Medicine

## 2017-01-28 ENCOUNTER — Ambulatory Visit: Payer: Self-pay | Attending: Family Medicine | Admitting: Family Medicine

## 2017-01-28 ENCOUNTER — Ambulatory Visit: Payer: Self-pay | Attending: Family Medicine | Admitting: Pharmacist

## 2017-01-28 VITALS — BP 132/80 | HR 70 | Temp 98.1°F | Ht 66.0 in | Wt >= 6400 oz

## 2017-01-28 DIAGNOSIS — Z79899 Other long term (current) drug therapy: Secondary | ICD-10-CM | POA: Insufficient documentation

## 2017-01-28 DIAGNOSIS — K219 Gastro-esophageal reflux disease without esophagitis: Secondary | ICD-10-CM | POA: Insufficient documentation

## 2017-01-28 DIAGNOSIS — R350 Frequency of micturition: Secondary | ICD-10-CM | POA: Insufficient documentation

## 2017-01-28 DIAGNOSIS — I2782 Chronic pulmonary embolism: Secondary | ICD-10-CM

## 2017-01-28 DIAGNOSIS — N183 Chronic kidney disease, stage 3 (moderate): Secondary | ICD-10-CM | POA: Insufficient documentation

## 2017-01-28 DIAGNOSIS — I482 Chronic atrial fibrillation, unspecified: Secondary | ICD-10-CM

## 2017-01-28 DIAGNOSIS — E876 Hypokalemia: Secondary | ICD-10-CM | POA: Insufficient documentation

## 2017-01-28 DIAGNOSIS — Z7901 Long term (current) use of anticoagulants: Secondary | ICD-10-CM | POA: Insufficient documentation

## 2017-01-28 DIAGNOSIS — N401 Enlarged prostate with lower urinary tract symptoms: Secondary | ICD-10-CM | POA: Insufficient documentation

## 2017-01-28 DIAGNOSIS — I2692 Saddle embolus of pulmonary artery without acute cor pulmonale: Secondary | ICD-10-CM

## 2017-01-28 DIAGNOSIS — Z6841 Body Mass Index (BMI) 40.0 and over, adult: Secondary | ICD-10-CM | POA: Insufficient documentation

## 2017-01-28 DIAGNOSIS — Z86711 Personal history of pulmonary embolism: Secondary | ICD-10-CM | POA: Insufficient documentation

## 2017-01-28 DIAGNOSIS — G4733 Obstructive sleep apnea (adult) (pediatric): Secondary | ICD-10-CM | POA: Insufficient documentation

## 2017-01-28 DIAGNOSIS — I129 Hypertensive chronic kidney disease with stage 1 through stage 4 chronic kidney disease, or unspecified chronic kidney disease: Secondary | ICD-10-CM | POA: Insufficient documentation

## 2017-01-28 DIAGNOSIS — I890 Lymphedema, not elsewhere classified: Secondary | ICD-10-CM | POA: Insufficient documentation

## 2017-01-28 DIAGNOSIS — I272 Pulmonary hypertension, unspecified: Secondary | ICD-10-CM | POA: Insufficient documentation

## 2017-01-28 DIAGNOSIS — I10 Essential (primary) hypertension: Secondary | ICD-10-CM

## 2017-01-28 DIAGNOSIS — F329 Major depressive disorder, single episode, unspecified: Secondary | ICD-10-CM | POA: Insufficient documentation

## 2017-01-28 DIAGNOSIS — F419 Anxiety disorder, unspecified: Secondary | ICD-10-CM | POA: Insufficient documentation

## 2017-01-28 LAB — POCT INR: INR: 2.8

## 2017-01-28 MED ORDER — ATORVASTATIN CALCIUM 20 MG PO TABS: 20.0000 mg | ORAL_TABLET | Freq: Every day | ORAL | 5 refills | Status: DC

## 2017-01-28 MED ORDER — FUROSEMIDE 40 MG PO TABS: 40.0000 mg | ORAL_TABLET | Freq: Two times a day (BID) | ORAL | 5 refills | Status: DC

## 2017-01-28 MED ORDER — TRAZODONE HCL 50 MG PO TABS: 50.0000 mg | ORAL_TABLET | Freq: Every day | ORAL | 5 refills | Status: DC

## 2017-01-28 MED ORDER — CARVEDILOL 12.5 MG PO TABS: ORAL_TABLET | ORAL | 5 refills | Status: DC

## 2017-01-28 MED ORDER — SERTRALINE HCL 50 MG PO TABS: 50.0000 mg | ORAL_TABLET | Freq: Every day | ORAL | 3 refills | Status: DC

## 2017-01-28 MED ORDER — TAMSULOSIN HCL 0.4 MG PO CAPS: 0.4000 mg | ORAL_CAPSULE | Freq: Every day | ORAL | 5 refills | Status: DC

## 2017-01-28 MED ORDER — POTASSIUM CHLORIDE ER 20 MEQ PO TBCR: 20.0000 meq | EXTENDED_RELEASE_TABLET | Freq: Every day | ORAL | 5 refills | Status: DC

## 2017-01-28 NOTE — Progress Notes (Signed)
Subjective:  Patient ID: Scott Shelton, male    DOB: [birth year]  Age: 66 y.o. MRN: [id number]  CC: Hypertension   HPI Scott Shelton  is a 66 year old male with a history of hypertension, paroxysmal atrial fibrillation (on Coumadin), lymphedema, BPH, anxiety and depression. h/o pulmonary embolism, obstructive sleep apnea who comes into the clinic for a follow-up visit.  He now has a CPAP machine and reports significant improvement in his quality of sleep.  He has been using compression stockings for his lymphedema and noticed some skin discoloration of his bilateral lower extremities worse on the left however on discontinuation of the stockings the symptoms improved. He denies trauma to his legs.  He denies chest pains, palpitations or shortness of breath and is tolerating his Coumadin for A. fib. His INR today is 2.8. For anxiety and depression he has been on Prozac which is discontinued as he complains of GI side effects including nausea and feeling unwell. His anxiety and depression stem his psychosocial stressors, increase weren't on his concerns over being able to afford this long-term and he is willing to try another medication. He denies suicidal ideations or intentions.     Past Medical History:  Diagnosis Date  . A-fib ([place])   . Bilateral leg edema   . Chronic kidney disease    STAGE III  . GERD (gastroesophageal reflux disease)   . History of cellulitis 05/21/15   BLE  . Hx pulmonary embolism    with hypoxic respiratory failure  . Pulmonary hypertension ([place])     Past Surgical History:  Procedure Laterality Date  . CATARACT EXTRACTION    . MELANOMA EXCISION Left 08/17/2015   Procedure: WIDE EXCISION MELANOMA  OF THE BACK ;  Surgeon: [name], MD;  Location: [place];  Service: General;  Laterality: Left;    No Known Allergies   Outpatient Medications Prior to Visit  Medication Sig Dispense Refill  . FLUoxetine (PROZAC) 20 MG capsule Take  2 capsules (40 mg total) by mouth daily. 60 capsule 5  . warfarin (COUMADIN) 5 MG tablet TAKE 1.5 TABLETS MON, WED, FRI, AND 1 TABLET DAILY ALL OTHER DAYS. 60 tablet 2  . atorvastatin (LIPITOR) 20 MG tablet Take 1 tablet (20 mg total) by mouth daily. 30 tablet 5  . carvedilol (COREG) 12.5 MG tablet TAKE 1 TABLET BY MOUTH 2 TIMES DAILY WITH A MEAL. 60 tablet 5  . furosemide (LASIX) 40 MG tablet Take 1 tablet (40 mg total) by mouth 2 (two) times daily. 60 tablet 5  . potassium chloride 20 MEQ TBCR Take 20 mEq by mouth daily. 30 tablet 5  . tamsulosin (FLOMAX) 0.4 MG CAPS capsule Take 1 capsule (0.4 mg total) by mouth daily. 30 capsule 5  . traZODone (DESYREL) 50 MG tablet Take 1 tablet (50 mg total) by mouth at bedtime. 30 tablet 3   No facility-administered medications prior to visit.     ROS Review of Systems  Constitutional: Negative for activity change and appetite change.  HENT: Negative for sinus pressure and sore throat.   Eyes: Negative for visual disturbance.  Respiratory: Negative for cough, chest tightness and shortness of breath.   Cardiovascular: Positive for leg swelling. Negative for chest pain.  Gastrointestinal: Negative for abdominal distention, abdominal pain, constipation and diarrhea.  Endocrine: Negative.   Genitourinary: Negative for dysuria.  Musculoskeletal: Negative for joint swelling and myalgias.  Skin: Positive for color change. Negative for rash.  Allergic/Immunologic: Negative.   Neurological: Negative for  weakness, light-headedness and numbness.  Psychiatric/Behavioral: Negative for dysphoric mood and suicidal ideas.       Positive for anxiety    Objective:  BP 132/80   Pulse 70   Temp 98.1 F (36.7 C) (Oral)   Ht 5\' 6"  (1.676 m)   Wt (!) 407 lb 12.8 oz (185 kg)   SpO2 96%   BMI 65.82 kg/m   BP/Weight 01/28/2017 10/25/2016 9/51/8841  Systolic BP 660 630 160  Diastolic BP 80 64 76  Wt. (Lbs) 407.8 406 399  BMI 65.82 65.53 60.67       Physical Exam  Constitutional: He is oriented to person, place, and time. He appears well-developed and well-nourished.  Morbidly obese  Cardiovascular: Normal rate, normal heart sounds and intact distal pulses.   No murmur heard. Pulmonary/Chest: Effort normal and breath sounds normal. He has no wheezes. He has no rales. He exhibits no tenderness.  Abdominal: Soft. Bowel sounds are normal. He exhibits no distension and no mass. There is no tenderness.  Musculoskeletal: Normal range of motion.  Neurological: He is alert and oriented to person, place, and time.  Skin:  Purplish discoloration and lateral aspect of the left leg; skin of right leg is slightly erythematous but not as extensive as on the left leg.  Psychiatric: He has a normal mood and affect.     Assessment & Plan:   1. Essential hypertension Controlled Low-sodium diet - carvedilol (COREG) 12.5 MG tablet; TAKE 1 TABLET BY MOUTH 2 TIMES DAILY WITH A MEAL.  Dispense: 60 tablet; Refill: 5  2. Benign prostatic hyperplasia with urinary frequency Stable - tamsulosin (FLOMAX) 0.4 MG CAPS capsule; Take 1 capsule (0.4 mg total) by mouth daily.  Dispense: 30 capsule; Refill: 5  3. Hypokalemia Secondary to Lasix - Potassium Chloride ER 20 MEQ TBCR; Take 20 mEq by mouth daily.  Dispense: 30 tablet; Refill: 5 - Basic Metabolic Panel  4. Lymphedema Uncontrolled Currently using compression stocking but is suffering some bruising due to his being on an anticoagulant Advised to use compression stocking intermittently and elevate feet - furosemide (LASIX) 40 MG tablet; Take 1 tablet (40 mg total) by mouth 2 (two) times daily.  Dispense: 60 tablet; Refill: 5  5. Anxiety and depression Due to underlying social situation Discontinue Prozac due to intolerance Comments Zoloft  6. Chronic atrial fibrillation (HCC) INR therapeutic today at 2.8 Continue current dose of Coumadin  7. Morbid obesity ([place]) He is working on  reducing his caloric intake and eliminating sweets from his diet Unable to exercise much due to medical comorbidities We have discussed medications available for weight loss and he will be thinking about this   Meds ordered this encounter  Medications  . carvedilol (COREG) 12.5 MG tablet    Sig: TAKE 1 TABLET BY MOUTH 2 TIMES DAILY WITH A MEAL.    Dispense:  60 tablet    Refill:  5    Must have office visit for refills  . tamsulosin (FLOMAX) 0.4 MG CAPS capsule    Sig: Take 1 capsule (0.4 mg total) by mouth daily.    Dispense:  30 capsule    Refill:  5  . Potassium Chloride ER 20 MEQ TBCR    Sig: Take 20 mEq by mouth daily.    Dispense:  30 tablet    Refill:  5  . atorvastatin (LIPITOR) 20 MG tablet    Sig: Take 1 tablet (20 mg total) by mouth daily.    Dispense:  30  tablet    Refill:  5  . furosemide (LASIX) 40 MG tablet    Sig: Take 1 tablet (40 mg total) by mouth 2 (two) times daily.    Dispense:  60 tablet    Refill:  5  . traZODone (DESYREL) 50 MG tablet    Sig: Take 1 tablet (50 mg total) by mouth at bedtime.    Dispense:  30 tablet    Refill:  5  . sertraline (ZOLOFT) 50 MG tablet    Sig: Take 1 tablet (50 mg total) by mouth daily.    Dispense:  30 tablet    Refill:  3    Discontinue Prozac    Follow-up: Return in about 3 months (around 04/30/2017) for Follow-up on chronic medical conditions.   Arnoldo Morale MD

## 2017-01-29 LAB — BASIC METABOLIC PANEL
BUN / CREAT RATIO: 17 (ref 10–24)
BUN: 18 mg/dL (ref 8–27)
CALCIUM: 10.2 mg/dL (ref 8.6–10.2)
CHLORIDE: 100 mmol/L (ref 96–106)
CO2: 25 mmol/L (ref 20–29)
CREATININE: 1.05 mg/dL (ref 0.76–1.27)
GFR calc non Af Amer: 75 mL/min/{1.73_m2} (ref >59)
GFR, EST AFRICAN AMERICAN: 86 mL/min/{1.73_m2} (ref >59)
Glucose: 101 mg/dL — ABNORMAL HIGH (ref 65–99)
Potassium: 4.9 mmol/L (ref 3.5–5.2)
Sodium: 139 mmol/L (ref 134–144)

## 2017-02-05 ENCOUNTER — Telehealth: Payer: Self-pay

## 2017-02-05 NOTE — Telephone Encounter (Signed)
Pt was called and informed of lab results. 

## 2017-02-10 MED FILL — ?CARVEDILOL 12.5 MG TABLET: 12.5 | 30 days supply | Qty: 60 | Fill #2

## 2017-02-12 MED FILL — ?ATORVASTATIN 20 MG TABLET: 20 | 30 days supply | Qty: 30 | Fill #4

## 2017-02-12 MED FILL — TAMSULOSIN HCL 0.4 MG CAP: 0.4 | 30 days supply | Qty: 30 | Fill #0

## 2017-02-26 ENCOUNTER — Ambulatory Visit: Payer: Self-pay | Attending: Family Medicine | Admitting: Pharmacist

## 2017-02-26 DIAGNOSIS — I482 Chronic atrial fibrillation, unspecified: Secondary | ICD-10-CM

## 2017-02-26 DIAGNOSIS — I2692 Saddle embolus of pulmonary artery without acute cor pulmonale: Secondary | ICD-10-CM

## 2017-02-26 LAB — POCT INR: INR: 2.1

## 2017-02-26 NOTE — Progress Notes (Signed)
Patient concerned over INR dropping from 2.8 to 2.1 (though still therapeutic) so will recheck INR in 2 weeks just to make sure it isn't still trending down. No changes in diet or medications to impact INR.

## 2017-03-10 IMAGING — DX DG CHEST 2V
2 series · 2 of 2 positions shown · non-contrast
Comparison: None.

CLINICAL DATA: Speech syncope, lightheadedness

EXAM:
CHEST  2 VIEW

[w chest lat]
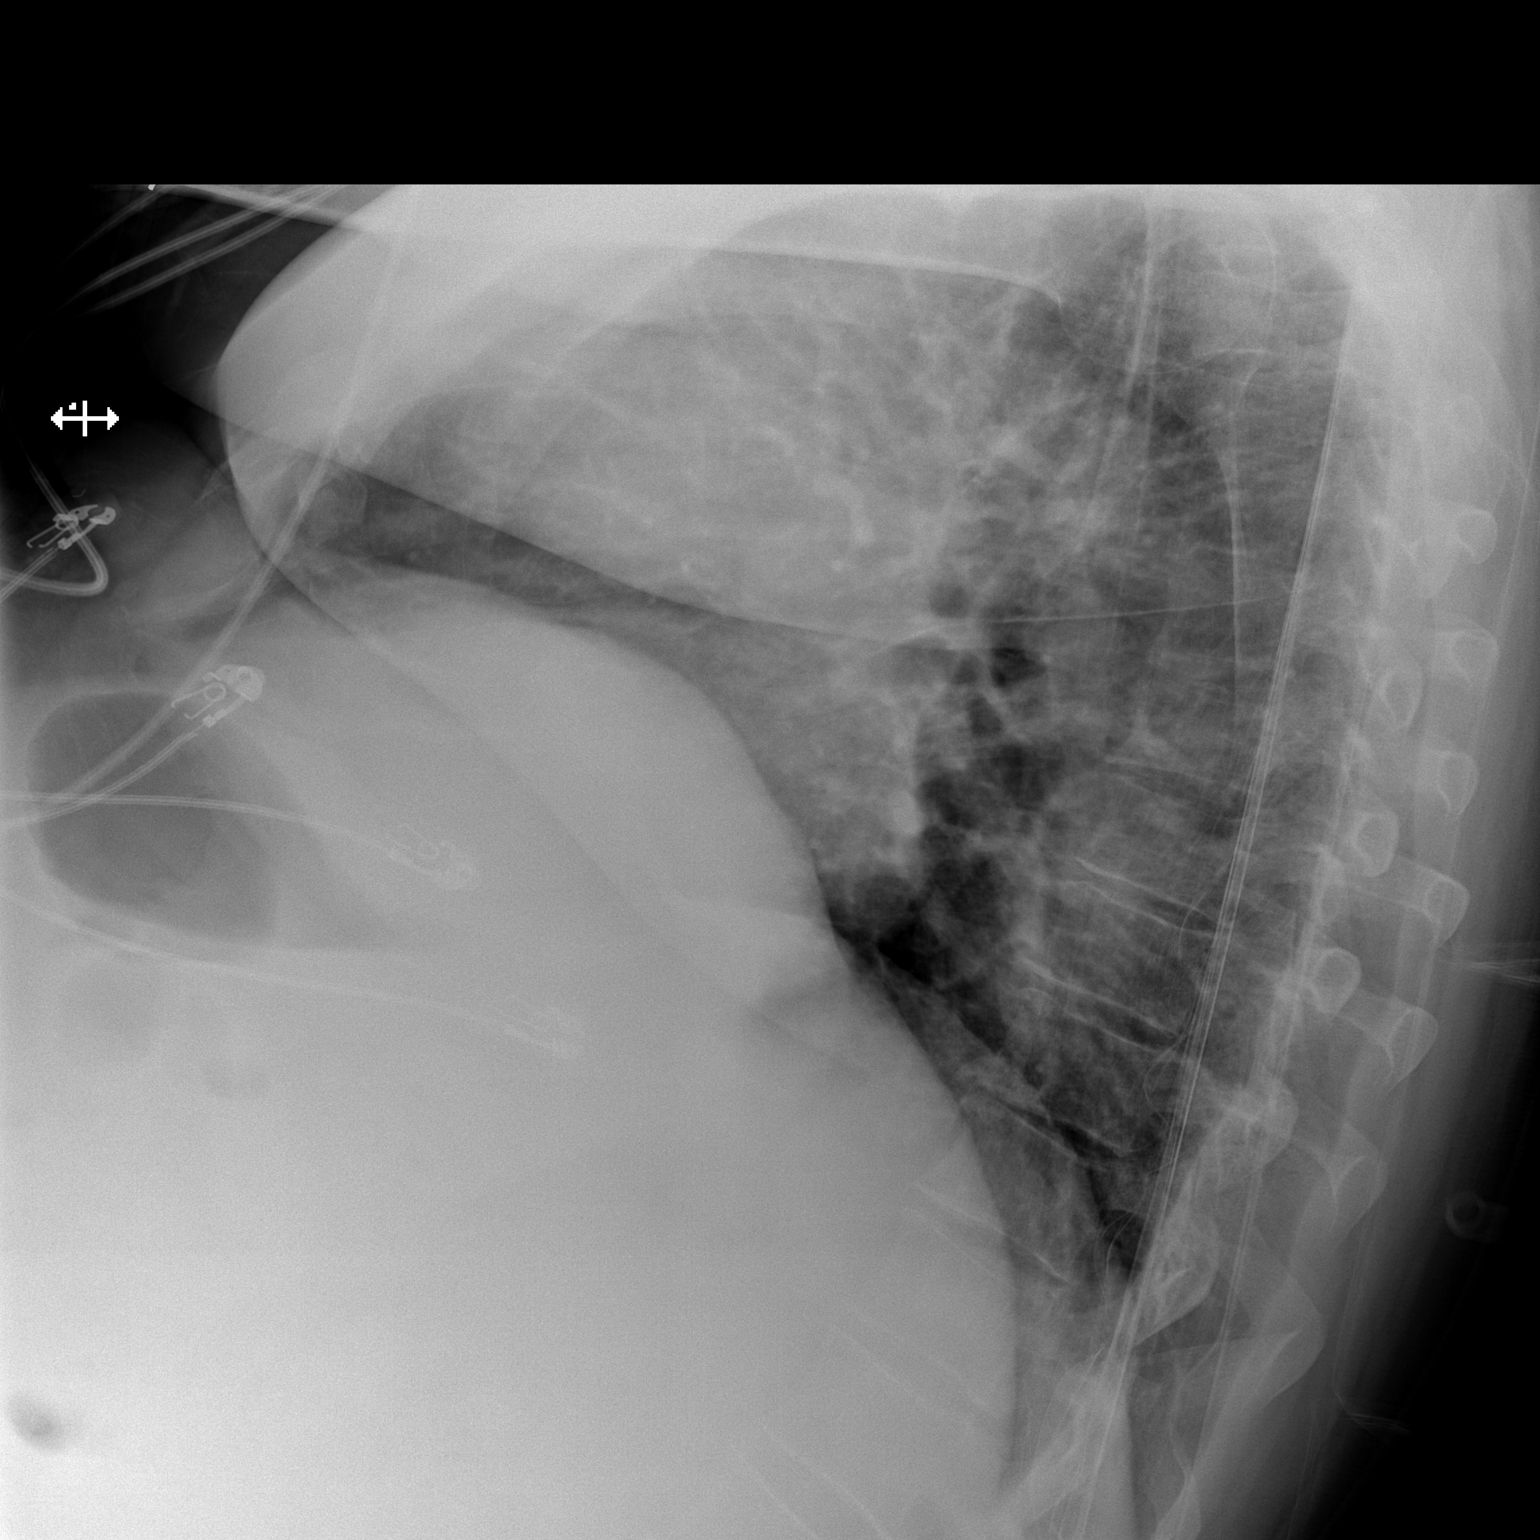

[x chest ap]
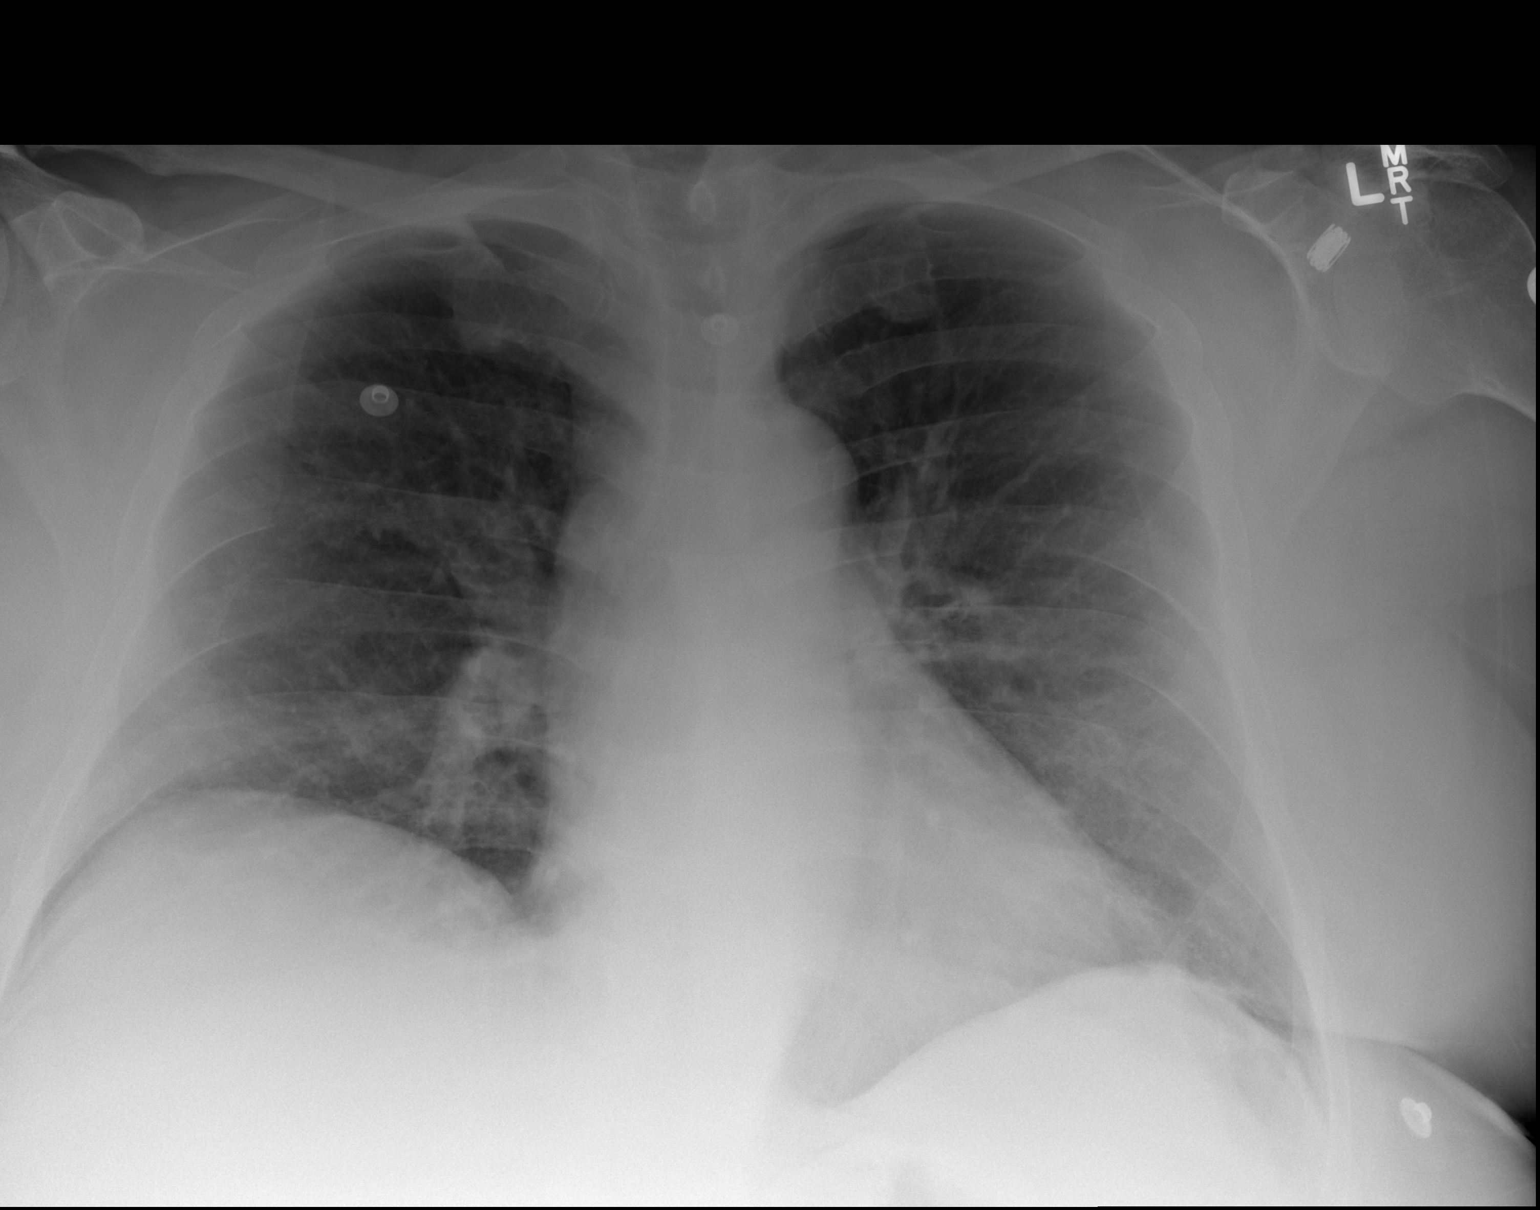

[2 of 2 positions shown; findings below may reference images not displayed]

FINDINGS: There is mild elevation of the right diaphragm. There is no focal
parenchymal opacity. There is no pleural effusion or pneumothorax.
The heart and mediastinal contours are unremarkable.

The osseous structures are unremarkable.
IMPRESSION: No active cardiopulmonary disease.

## 2017-03-11 ENCOUNTER — Ambulatory Visit: Payer: Self-pay | Attending: Family Medicine | Admitting: Pharmacist

## 2017-03-11 DIAGNOSIS — I2782 Chronic pulmonary embolism: Secondary | ICD-10-CM

## 2017-03-11 DIAGNOSIS — I10 Essential (primary) hypertension: Secondary | ICD-10-CM | POA: Insufficient documentation

## 2017-03-11 DIAGNOSIS — I2692 Saddle embolus of pulmonary artery without acute cor pulmonale: Secondary | ICD-10-CM | POA: Insufficient documentation

## 2017-03-11 DIAGNOSIS — I482 Chronic atrial fibrillation, unspecified: Secondary | ICD-10-CM

## 2017-03-11 LAB — POCT INR: INR: 2.9

## 2017-03-11 MED FILL — ?CARVEDILOL 12.5 MG TABLET: 12.5 | 30 days supply | Qty: 60 | Fill #3

## 2017-03-11 MED FILL — POTASSIUM CL ER 20 MEQ TAB: 20 | 30 days supply | Qty: 30 | Fill #3

## 2017-03-12 IMAGING — CR DG ABDOMEN 1V
1 series · 1 of 1 positions shown · non-contrast
Comparison: May 11, 2015

CLINICAL DATA: Evaluate nasogastric tube placement

EXAM:
ABDOMEN - 1 VIEW

[AP]
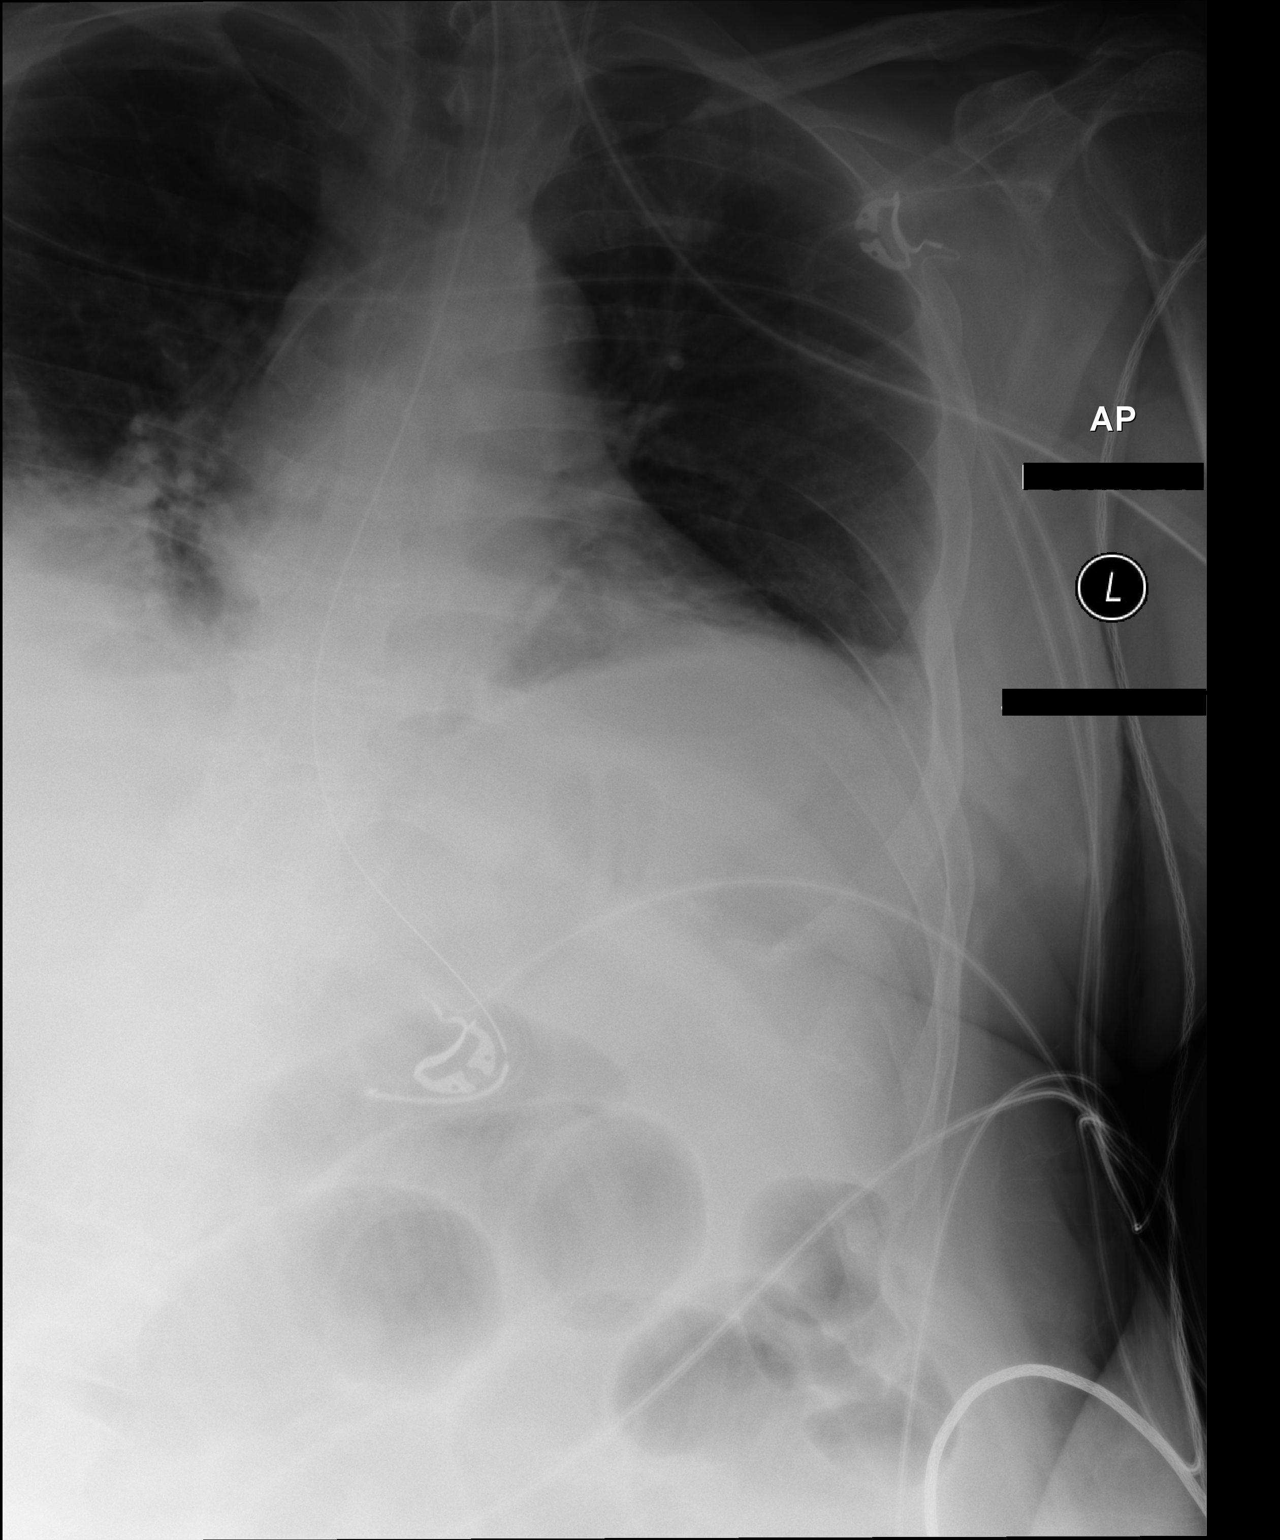

[1 of 1 positions shown; findings below may reference images not displayed]

FINDINGS: Nasogastric tube is identified with distal tip in the stomach. There
are multiple air-filled dilated small bowel loops. Left central
venous line is identified with distal tip in the superior vena cava.
IMPRESSION: Nasogastric tube distal tip in the stomach.

Small bowel obstruction.

## 2017-03-24 MED FILL — ?TAMSULOSIN HCL 0.4 MG CAP: 0.4 | 30 days supply | Qty: 30 | Fill #1

## 2017-03-28 IMAGING — DX DG CHEST 2V
2 series · 2 of 2 positions shown · non-contrast
Comparison: 05/10/2015

CLINICAL DATA: Chest pain.  Syncope today.

EXAM:
CHEST  2 VIEW

[chest lat]
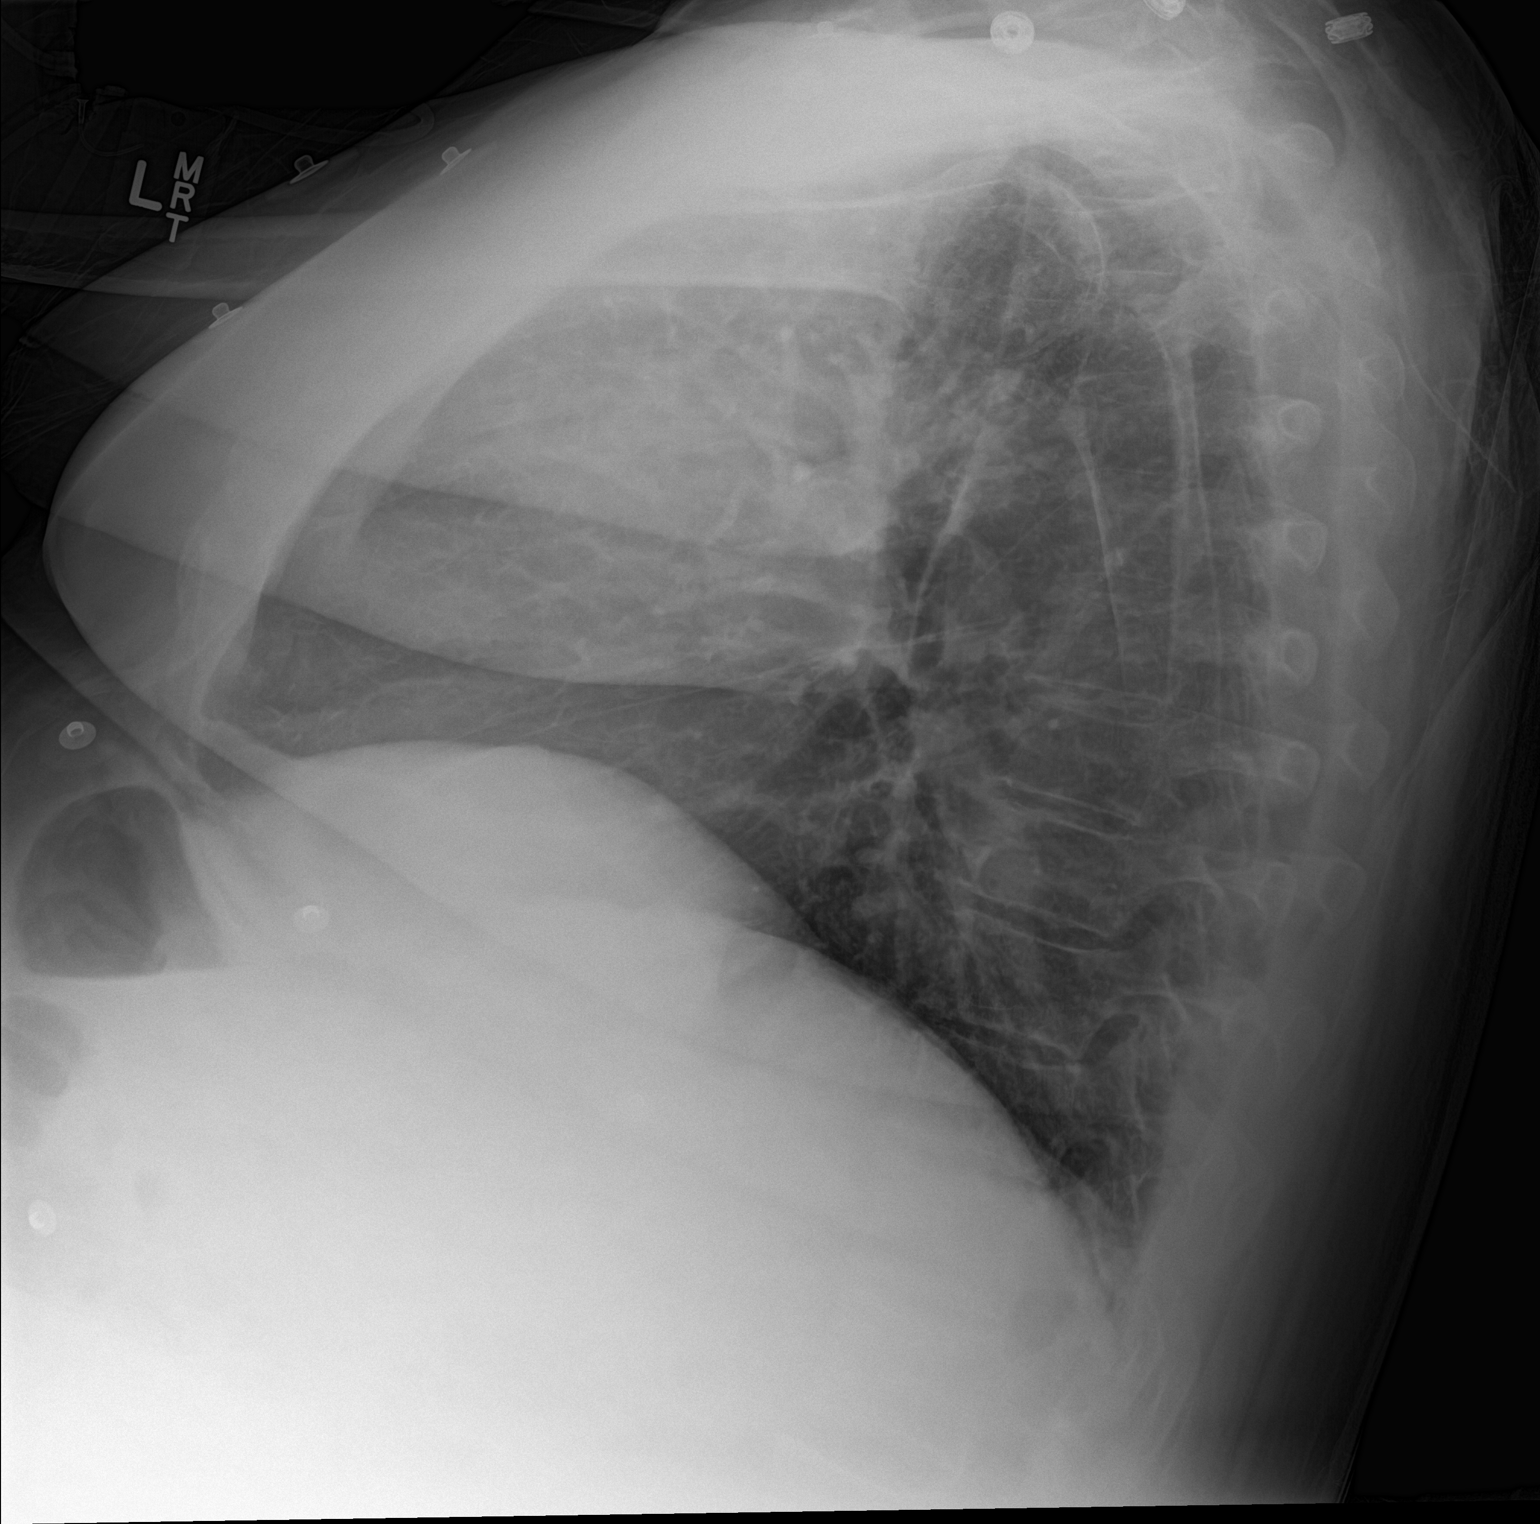

[chest ap]
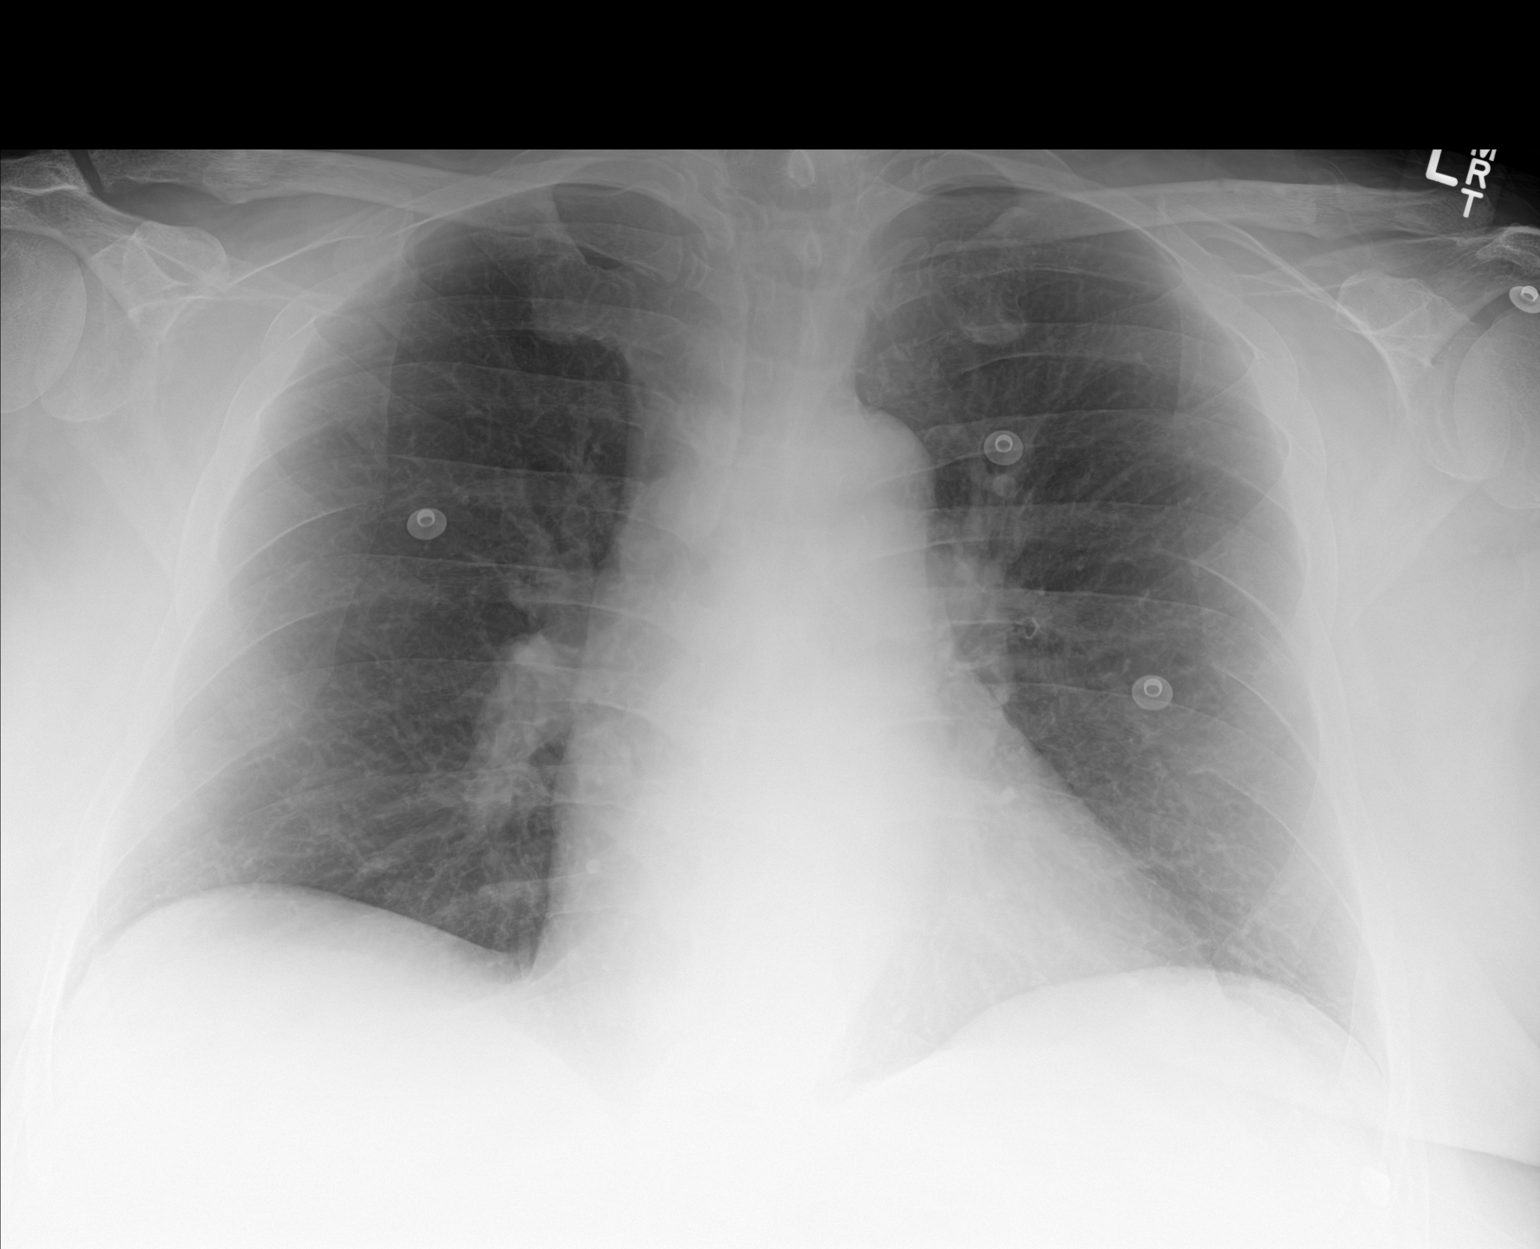

[2 of 2 positions shown; findings below may reference images not displayed]

FINDINGS: The cardiomediastinal contours are unchanged, heart at the upper
limits in size. Previous left central line is no longer seen.
Pulmonary vasculature is normal. No consolidation, pleural effusion,
or pneumothorax. No acute osseous abnormalities are seen.
IMPRESSION: No acute pulmonary process.

## 2017-04-21 MED FILL — traZODone HCL 50 MG TABS: 50 | 30 days supply | Qty: 30 | Fill #0

## 2017-04-21 MED FILL — SERTRALINE HCL 50 MG TABLET: 50 | 30 days supply | Qty: 30 | Fill #0

## 2017-05-01 MED FILL — ?TAMSULOSIN HCL 0.4 MG CAP: 0.4 | 30 days supply | Qty: 30 | Fill #2

## 2017-05-01 MED FILL — ?ATORVASTATIN 20MG TABLET: 20 | 30 days supply | Qty: 30 | Fill #5

## 2017-05-01 MED FILL — ?CARVEDILOL 12.5 MG TABLET: 12.5 | 30 days supply | Qty: 60 | Fill #4

## 2017-05-01 MED FILL — POTASSIUM CL ER 20 MEQ TAB: 20 | 30 days supply | Qty: 30 | Fill #4

## 2017-05-02 ENCOUNTER — Ambulatory Visit: Payer: Self-pay | Admitting: Family Medicine

## 2017-05-05 ENCOUNTER — Encounter: Payer: Self-pay | Admitting: Family Medicine

## 2017-05-05 ENCOUNTER — Ambulatory Visit: Payer: Self-pay | Attending: Family Medicine | Admitting: Family Medicine

## 2017-05-05 ENCOUNTER — Ambulatory Visit (HOSPITAL_BASED_OUTPATIENT_CLINIC_OR_DEPARTMENT_OTHER): Payer: Self-pay | Admitting: Pharmacist

## 2017-05-05 VITALS — BP 137/74 | HR 69 | Temp 98.1°F | Ht 66.0 in | Wt 391.0 lb

## 2017-05-05 DIAGNOSIS — R350 Frequency of micturition: Secondary | ICD-10-CM | POA: Insufficient documentation

## 2017-05-05 DIAGNOSIS — K219 Gastro-esophageal reflux disease without esophagitis: Secondary | ICD-10-CM | POA: Insufficient documentation

## 2017-05-05 DIAGNOSIS — I482 Chronic atrial fibrillation, unspecified: Secondary | ICD-10-CM

## 2017-05-05 DIAGNOSIS — Z79899 Other long term (current) drug therapy: Secondary | ICD-10-CM | POA: Insufficient documentation

## 2017-05-05 DIAGNOSIS — I2692 Saddle embolus of pulmonary artery without acute cor pulmonale: Secondary | ICD-10-CM

## 2017-05-05 DIAGNOSIS — Z7901 Long term (current) use of anticoagulants: Secondary | ICD-10-CM | POA: Insufficient documentation

## 2017-05-05 DIAGNOSIS — I890 Lymphedema, not elsewhere classified: Secondary | ICD-10-CM | POA: Insufficient documentation

## 2017-05-05 DIAGNOSIS — I272 Pulmonary hypertension, unspecified: Secondary | ICD-10-CM | POA: Insufficient documentation

## 2017-05-05 DIAGNOSIS — Z9849 Cataract extraction status, unspecified eye: Secondary | ICD-10-CM | POA: Insufficient documentation

## 2017-05-05 DIAGNOSIS — I2782 Chronic pulmonary embolism: Secondary | ICD-10-CM

## 2017-05-05 DIAGNOSIS — N401 Enlarged prostate with lower urinary tract symptoms: Secondary | ICD-10-CM | POA: Insufficient documentation

## 2017-05-05 DIAGNOSIS — G4733 Obstructive sleep apnea (adult) (pediatric): Secondary | ICD-10-CM | POA: Insufficient documentation

## 2017-05-05 DIAGNOSIS — F329 Major depressive disorder, single episode, unspecified: Secondary | ICD-10-CM | POA: Insufficient documentation

## 2017-05-05 DIAGNOSIS — Z6841 Body Mass Index (BMI) 40.0 and over, adult: Secondary | ICD-10-CM | POA: Insufficient documentation

## 2017-05-05 DIAGNOSIS — E876 Hypokalemia: Secondary | ICD-10-CM | POA: Insufficient documentation

## 2017-05-05 DIAGNOSIS — E785 Hyperlipidemia, unspecified: Secondary | ICD-10-CM | POA: Insufficient documentation

## 2017-05-05 DIAGNOSIS — I10 Essential (primary) hypertension: Secondary | ICD-10-CM

## 2017-05-05 DIAGNOSIS — F419 Anxiety disorder, unspecified: Secondary | ICD-10-CM | POA: Insufficient documentation

## 2017-05-05 DIAGNOSIS — I480 Paroxysmal atrial fibrillation: Secondary | ICD-10-CM | POA: Insufficient documentation

## 2017-05-05 DIAGNOSIS — Z86711 Personal history of pulmonary embolism: Secondary | ICD-10-CM | POA: Insufficient documentation

## 2017-05-05 DIAGNOSIS — Z85820 Personal history of malignant melanoma of skin: Secondary | ICD-10-CM | POA: Insufficient documentation

## 2017-05-05 DIAGNOSIS — E7800 Pure hypercholesterolemia, unspecified: Secondary | ICD-10-CM | POA: Insufficient documentation

## 2017-05-05 DIAGNOSIS — I129 Hypertensive chronic kidney disease with stage 1 through stage 4 chronic kidney disease, or unspecified chronic kidney disease: Secondary | ICD-10-CM | POA: Insufficient documentation

## 2017-05-05 DIAGNOSIS — N183 Chronic kidney disease, stage 3 (moderate): Secondary | ICD-10-CM | POA: Insufficient documentation

## 2017-05-05 LAB — POCT INR: INR: 2.3

## 2017-05-05 MED ORDER — TRAZODONE HCL 50 MG PO TABS: 50.0000 mg | ORAL_TABLET | Freq: Every day | ORAL | 5 refills | Status: DC

## 2017-05-05 MED ORDER — TAMSULOSIN HCL 0.4 MG PO CAPS: 0.4000 mg | ORAL_CAPSULE | Freq: Every day | ORAL | 5 refills | Status: DC

## 2017-05-05 MED ORDER — ATORVASTATIN CALCIUM 20 MG PO TABS: 20.0000 mg | ORAL_TABLET | Freq: Every day | ORAL | 5 refills | Status: DC

## 2017-05-05 MED ORDER — SERTRALINE HCL 50 MG PO TABS: 50.0000 mg | ORAL_TABLET | Freq: Every day | ORAL | 3 refills | Status: DC

## 2017-05-05 MED ORDER — POTASSIUM CHLORIDE ER 20 MEQ PO TBCR: 20.0000 meq | EXTENDED_RELEASE_TABLET | Freq: Every day | ORAL | 5 refills | Status: DC

## 2017-05-05 MED ORDER — FUROSEMIDE 40 MG PO TABS: 40.0000 mg | ORAL_TABLET | Freq: Every day | ORAL | 5 refills | Status: DC

## 2017-05-05 MED ORDER — CARVEDILOL 12.5 MG PO TABS: ORAL_TABLET | ORAL | 5 refills | Status: DC

## 2017-05-05 NOTE — Patient Instructions (Signed)

## 2017-05-05 NOTE — Progress Notes (Signed)
Subjective:  Patient ID: Scott Shelton, male    DOB: 10-22-1950  Age: 67 y.o. MRN: [id number]  CC: Hypertension  HPI Scott Shelton  is a 67 year old male with a history of hypertension, paroxysmal atrial fibrillation (on Coumadin), lymphedema, BPH, anxiety and depression. h/o pulmonary embolism, obstructive sleep apnea who comes into the clinic for a follow-up visit.  He has lost 16 pounds since his last visit 3 months ago by means of eating healthy; he has been unable to exercise much due to his ambulation limitations; he has also noticed improvement in his pedal edema. His last INR was 2.9 at his last visit with the clinical pharmacist on 03/11/17 and he denies bruising or bleeding. He takes Lasix 40 mg twice daily and would like reduction to 40 mg once daily as he seems to be doing well on the new dose.  He was switched to Zoloft from Prozac at his last visit due to intolerance of the former and reports doing well on Zoloft. Anxiety and depression currently controlled. His BPH symptoms are stable and controlled on Flomax. Using his CPAP machine and reports improved quality of sleep. He tolerates all his other medications with no adverse effects.  Past Medical History:  Diagnosis Date  . A-fib ([place])   . Bilateral leg edema   . Chronic kidney disease    STAGE III  . GERD (gastroesophageal reflux disease)   . History of cellulitis 05/21/15   BLE  . Hx pulmonary embolism    with hypoxic respiratory failure  . Pulmonary hypertension ([place])     Past Surgical History:  Procedure Laterality Date  . CATARACT EXTRACTION    . MELANOMA EXCISION Left 08/17/2015   Procedure: WIDE EXCISION MELANOMA  OF THE BACK ;  Surgeon: [name], MD;  Location: [place];  Service: General;  Laterality: Left;    No Known Allergies   Outpatient Medications Prior to Visit  Medication Sig Dispense Refill  . warfarin (COUMADIN) 5 MG tablet TAKE 1.5 TABLETS MON, WED, FRI, AND 1  TABLET DAILY ALL OTHER DAYS. 60 tablet 2  . atorvastatin (LIPITOR) 20 MG tablet Take 1 tablet (20 mg total) by mouth daily. 30 tablet 5  . carvedilol (COREG) 12.5 MG tablet TAKE 1 TABLET BY MOUTH 2 TIMES DAILY WITH A MEAL. 60 tablet 5  . furosemide (LASIX) 40 MG tablet Take 1 tablet (40 mg total) by mouth 2 (two) times daily. 60 tablet 5  . Potassium Chloride ER 20 MEQ TBCR Take 20 mEq by mouth daily. 30 tablet 5  . sertraline (ZOLOFT) 50 MG tablet Take 1 tablet (50 mg total) by mouth daily. 30 tablet 3  . tamsulosin (FLOMAX) 0.4 MG CAPS capsule Take 1 capsule (0.4 mg total) by mouth daily. 30 capsule 5  . traZODone (DESYREL) 50 MG tablet Take 1 tablet (50 mg total) by mouth at bedtime. 30 tablet 5  . FLUoxetine (PROZAC) 20 MG capsule Take 2 capsules (40 mg total) by mouth daily. (Patient not taking: Reported on 05/05/2017) 60 capsule 5   No facility-administered medications prior to visit.     ROS Review of Systems  Constitutional: Negative for activity change and appetite change.  HENT: Negative for sinus pressure and sore throat.   Eyes: Negative for visual disturbance.  Respiratory: Negative for cough, chest tightness and shortness of breath.   Cardiovascular: Positive for leg swelling. Negative for chest pain.  Gastrointestinal: Negative for abdominal distention, abdominal pain, constipation and diarrhea.  Endocrine: Negative.  Genitourinary: Negative for dysuria.  Musculoskeletal: Negative for joint swelling and myalgias.  Skin: Negative for rash.  Allergic/Immunologic: Negative.   Neurological: Negative for weakness, light-headedness and numbness.  Psychiatric/Behavioral: Negative for dysphoric mood and suicidal ideas.    Objective:  BP 137/74   Pulse 69   Temp 98.1 F (36.7 C) (Oral)   Ht 5\' 6"  (1.676 m)   Wt (!) 391 lb (177.4 kg)   SpO2 95%   BMI 63.11 kg/m   BP/Weight 05/05/2017 01/28/2017 7/74/1287  Systolic BP 867 672 094  Diastolic BP 74 80 64  Wt. (Lbs) 391  407.8 406  BMI 63.11 65.82 65.53      Physical Exam  Constitutional: He is oriented to person, place, and time. He appears well-developed and well-nourished.  HENT:  Head: Normocephalic and atraumatic.  Right Ear: External ear normal.  Left Ear: External ear normal.  Eyes: Conjunctivae and EOM are normal. Pupils are equal, round, and reactive to light.  Neck: Normal range of motion. Neck supple. No tracheal deviation present.  Cardiovascular: Normal rate, regular rhythm and normal heart sounds.  No murmur heard. Pulmonary/Chest: Effort normal and breath sounds normal. No respiratory distress. He has no wheezes. He exhibits no tenderness.  Abdominal: Soft. Bowel sounds are normal. He exhibits no mass. There is no tenderness.  Musculoskeletal: Normal range of motion. He exhibits edema (bilateral pedal edema, non pitting improved from previous visit). He exhibits no tenderness.  Neurological: He is alert and oriented to person, place, and time.  Skin: Skin is warm and dry.  Psychiatric: He has a normal mood and affect.    CMP Latest Ref Rng & Units 01/28/2017 10/25/2016 05/20/2016  Glucose 65 - 99 mg/dL 101(H) 101(H) 102(H)  BUN 8 - 27 mg/dL 18 18 20   Creatinine 0.76 - 1.27 mg/dL 1.05 1.19 1.34(H)  Sodium 134 - 144 mmol/L 139 139 139  Potassium 3.5 - 5.2 mmol/L 4.9 4.9 4.8  Chloride 96 - 106 mmol/L 100 103 106  CO2 20 - 29 mmol/L 25 21 21   Calcium 8.6 - 10.2 mg/dL 10.2 10.3(H) 9.6  Total Protein 6.0 - 8.5 g/dL - 6.6 6.3  Total Bilirubin 0.0 - 1.2 mg/dL - 0.4 0.3  Alkaline Phos 39 - 117 IU/L - 114 68  AST 0 - 40 IU/L - 16 16  ALT 0 - 44 IU/L - 18 16    Lipid Panel     Component Value Date/Time   CHOL 209 (H) 10/25/2016 1201   TRIG 272 (H) 10/25/2016 1201   HDL 33 (L) 10/25/2016 1201   CHOLHDL 6.3 (H) 10/25/2016 1201   CHOLHDL 4.9 11/21/2015 0849   VLDL 44 (H) 11/21/2015 0849   LDLCALC 122 (H) 10/25/2016 1201     Assessment & Plan:   1. Essential  hypertension Controlled Counseled on blood pressure goal of less than 130/80, low-sodium, DASH diet, medication compliance, 150 minutes of moderate intensity exercise per week. Discussed medication compliance, adverse effects. - carvedilol (COREG) 12.5 MG tablet; TAKE 1 TABLET BY MOUTH 2 TIMES DAILY WITH A MEAL.  Dispense: 60 tablet; Refill: 5  2. Lymphedema Improved Continue compression stockings - furosemide (LASIX) 40 MG tablet; Take 1 tablet (40 mg total) by mouth daily.  Dispense: 30 tablet; Refill: 5  3. Hypokalemia Secondary to Lasix use - Potassium Chloride ER 20 MEQ TBCR; Take 20 mEq by mouth daily.  Dispense: 30 tablet; Refill: 5  4. Benign prostatic hyperplasia with urinary frequency Controlled - tamsulosin (FLOMAX) 0.4 MG  CAPS capsule; Take 1 capsule (0.4 mg total) by mouth daily.  Dispense: 30 capsule; Refill: 5  5. OSA (obstructive sleep apnea) Doing well on CPAP  6. Anxiety and depression Stable - sertraline (ZOLOFT) 50 MG tablet; Take 1 tablet (50 mg total) by mouth daily.  Dispense: 30 tablet; Refill: 3  7. Pure hypercholesterolemia Uncontrolled LDL is 122 which is above goal of less than 100 We will repeat at next visit  8. Morbid obesity ([place]) Commended on recent weight loss of 16 pounds Continue to work on reducing portion sizes, avoiding late meals  8.  Atrial fibrillation   Meds ordered this encounter  Medications  . furosemide (LASIX) 40 MG tablet    Sig: Take 1 tablet (40 mg total) by mouth daily.    Dispense:  30 tablet    Refill:  5    Decrease previous dose  . atorvastatin (LIPITOR) 20 MG tablet    Sig: Take 1 tablet (20 mg total) by mouth daily.    Dispense:  30 tablet    Refill:  5  . carvedilol (COREG) 12.5 MG tablet    Sig: TAKE 1 TABLET BY MOUTH 2 TIMES DAILY WITH A MEAL.    Dispense:  60 tablet    Refill:  5  . Potassium Chloride ER 20 MEQ TBCR    Sig: Take 20 mEq by mouth daily.    Dispense:  30 tablet    Refill:  5  .  sertraline (ZOLOFT) 50 MG tablet    Sig: Take 1 tablet (50 mg total) by mouth daily.    Dispense:  30 tablet    Refill:  3    Discontinue Prozac  . tamsulosin (FLOMAX) 0.4 MG CAPS capsule    Sig: Take 1 capsule (0.4 mg total) by mouth daily.    Dispense:  30 capsule    Refill:  5  . traZODone (DESYREL) 50 MG tablet    Sig: Take 1 tablet (50 mg total) by mouth at bedtime.    Dispense:  30 tablet    Refill:  5    Follow-up: No Follow-up on file.   Charlott Rakes MD

## 2017-05-06 ENCOUNTER — Encounter: Payer: Self-pay | Admitting: Family Medicine

## 2017-06-02 MED FILL — POTASSIUM CL ER 20 MEQ TAB: 20 | 30 days supply | Qty: 30 | Fill #5

## 2017-06-02 MED FILL — ?FUROSEMIDE 40 MG TABLET: 40 | 30 days supply | Qty: 60 | Fill #2

## 2017-06-02 MED FILL — ?WARFARIN SODIUM 5 MG TABLE: 5 | 42 days supply | Qty: 60 | Fill #1

## 2017-06-02 MED FILL — ATORVASTATIN 20 MG TABLET: 20 | 30 days supply | Qty: 30 | Fill #0

## 2017-06-02 MED FILL — ?CARVEDILOL 12.5 MG TABLET: 12.5 | 30 days supply | Qty: 60 | Fill #5

## 2017-06-02 MED FILL — SERTRALINE HCL 50 MG TABLET: 50 | 30 days supply | Qty: 30 | Fill #1

## 2017-06-02 MED FILL — ?TAMSULOSIN HCL 0.4 MG CAP: 0.4 | 30 days supply | Qty: 30 | Fill #3

## 2017-06-05 ENCOUNTER — Ambulatory Visit: Payer: Self-pay | Attending: Family Medicine | Admitting: Pharmacist

## 2017-06-05 DIAGNOSIS — Z7901 Long term (current) use of anticoagulants: Secondary | ICD-10-CM | POA: Insufficient documentation

## 2017-06-05 DIAGNOSIS — I4891 Unspecified atrial fibrillation: Secondary | ICD-10-CM | POA: Insufficient documentation

## 2017-06-05 DIAGNOSIS — I2782 Chronic pulmonary embolism: Secondary | ICD-10-CM

## 2017-06-05 DIAGNOSIS — I482 Chronic atrial fibrillation, unspecified: Secondary | ICD-10-CM

## 2017-06-05 DIAGNOSIS — I2692 Saddle embolus of pulmonary artery without acute cor pulmonale: Secondary | ICD-10-CM

## 2017-06-05 DIAGNOSIS — I2699 Other pulmonary embolism without acute cor pulmonale: Secondary | ICD-10-CM | POA: Insufficient documentation

## 2017-06-05 LAB — POCT INR: INR: 2.8

## 2017-06-05 MED ORDER — WARFARIN SODIUM 5 MG PO TABS: ORAL_TABLET | ORAL | 2 refills | Status: DC

## 2017-06-20 ENCOUNTER — Telehealth: Payer: Self-pay | Admitting: Family Medicine

## 2017-06-20 NOTE — Telephone Encounter (Signed)
Pt called to let PCP know buses for [employer] have changed and he needs to recertify to continue using them. Patient requested to please write a letter stating that he needs his rotator due to his handicap situation. Please follow up.

## 2017-06-20 NOTE — Telephone Encounter (Signed)
Called patient, he states he needs letter ASAP, he has INR appointment on 07/01/2017. He has not yet set up appointment for recert at [place] office. He also request letter for mailman to deliver mail to door versus him having to walk to the mailbox in his complex. Please advise.

## 2017-06-23 NOTE — Telephone Encounter (Signed)
Call placed to the patient to clarify the request for a letter. He explained that [employer] now has a new process for approving rides for patients in the [place].  He stated that they are currently sending a small car to pick him up and the car does not accommodate his rollator. He stated that he needs his rollator when he is ambulating longer distances and needs to rest or needs to rest when standing for an extended period of time. He said that if someone else is in the car he is not able to take his rollator with him. He stated that he spoke to [place] with [employer] and she stated that a letter is needed from his provider noting that he needs to use the rollator in order to be able to ride the [employer]. When asked if he needs to be re-certified, he said that he just needs the letter for now. This CM informed him that this CM will need to call [place] with [employer] to confirm what documentation is needed.  He also noted that he will need a letter from his provider explaining that he needs to have his mail delivered to his door, as his mailbox is a mile from his home. Informed him that his PCP will need to confirm that need for mail delivery and his PCP is off the beginning of this week.  Call placed to [facility] with [employer] # [phone] and explained the patient's request. She said that a letter is not needed but the patient may need to be re-certified which includes completing parts A and B of the [employer] application. She said that she will review his application and confirm that his disability precludes him from riding in the car.  She noted that [employer] now has to verify that the patient's disability will only allow them to ride in a [place].  Only certain types of disabilities will qualify an individual for a van ride. She said that she will review his record and then contact him and also follow up with this CM to report if a new application is needed. She also noted that no additional documentation may needed. She has CM call back #  [phone].

## 2017-06-24 ENCOUNTER — Telehealth: Payer: Self-pay

## 2017-06-24 NOTE — Telephone Encounter (Signed)
Call received from [place] with [employer] called to state that she reviewed the patient's wife and his leg edema qualifies him to ride the [place] instead of the car. She said that she would call the patient and notify him. She stated that nothing more is needed from this CM

## 2017-06-25 ENCOUNTER — Other Ambulatory Visit: Payer: Self-pay | Admitting: Family Medicine

## 2017-06-25 NOTE — Telephone Encounter (Signed)
I have written a letter to assist with mail delivery. Will follow up with CM regarding [employer] needs.

## 2017-06-25 NOTE — Telephone Encounter (Signed)
Patient was called and patient states that he got a call from [place] stating that he did not need the letter from PCP. Patient stated that he would still like to pick up the letter.

## 2017-06-26 ENCOUNTER — Telehealth: Payer: Self-pay | Admitting: Family Medicine

## 2017-06-26 NOTE — Telephone Encounter (Signed)
Letter for mail assistance delivery was written by provider and has been placed at the front desk area for patient to pick up.

## 2017-06-27 MED FILL — TAMSULOSIN HCL 0.4 MG CAP: 0.4 | 30 days supply | Qty: 30 | Fill #4

## 2017-06-27 MED FILL — CARVEDILOL 12.5 MG TABLET: 12.5 | 30 days supply | Qty: 60 | Fill #0

## 2017-06-27 MED FILL — SERTRALINE HCL 50 MG TABLET: 50 | 30 days supply | Qty: 30 | Fill #2

## 2017-06-27 MED FILL — ATORVASTATIN 20 MG TABLET: 20 | 30 days supply | Qty: 30 | Fill #1

## 2017-06-27 MED FILL — POTASSIUM CL ER 20 MEQ TAB: 20 | 30 days supply | Qty: 30 | Fill #0

## 2017-07-03 ENCOUNTER — Ambulatory Visit: Payer: Commercial Managed Care - HMO | Attending: Family Medicine | Admitting: Pharmacist

## 2017-07-03 DIAGNOSIS — I2699 Other pulmonary embolism without acute cor pulmonale: Secondary | ICD-10-CM | POA: Diagnosis not present

## 2017-07-03 DIAGNOSIS — Z79899 Other long term (current) drug therapy: Secondary | ICD-10-CM | POA: Diagnosis not present

## 2017-07-03 DIAGNOSIS — I2692 Saddle embolus of pulmonary artery without acute cor pulmonale: Secondary | ICD-10-CM

## 2017-07-03 DIAGNOSIS — I4891 Unspecified atrial fibrillation: Secondary | ICD-10-CM | POA: Diagnosis not present

## 2017-07-03 DIAGNOSIS — Z7901 Long term (current) use of anticoagulants: Secondary | ICD-10-CM | POA: Diagnosis not present

## 2017-07-03 DIAGNOSIS — I2782 Chronic pulmonary embolism: Secondary | ICD-10-CM

## 2017-07-03 DIAGNOSIS — I482 Chronic atrial fibrillation, unspecified: Secondary | ICD-10-CM

## 2017-07-03 LAB — POCT INR: INR: 3

## 2017-07-16 ENCOUNTER — Ambulatory Visit: Payer: Commercial Managed Care - HMO | Attending: Family Medicine | Admitting: Pharmacist

## 2017-07-16 DIAGNOSIS — I482 Chronic atrial fibrillation, unspecified: Secondary | ICD-10-CM

## 2017-07-16 DIAGNOSIS — I4891 Unspecified atrial fibrillation: Secondary | ICD-10-CM | POA: Diagnosis not present

## 2017-07-16 DIAGNOSIS — I2692 Saddle embolus of pulmonary artery without acute cor pulmonale: Secondary | ICD-10-CM

## 2017-07-16 LAB — POCT INR: INR: 1.8

## 2017-07-16 NOTE — Patient Instructions (Signed)
Thank you for coming to see me today. Please take 2 tablets today and then continue to take 1.5 tablets on Mondays, Wednesdays, and Fridays with 1 tablet all other days.

## 2017-07-18 ENCOUNTER — Ambulatory Visit: Payer: Medicare PPO | Admitting: Cardiovascular Disease

## 2017-07-18 ENCOUNTER — Encounter: Payer: Self-pay | Admitting: Cardiovascular Disease

## 2017-07-18 VITALS — BP 114/58 | HR 82 | Ht 68.0 in | Wt 387.0 lb

## 2017-07-18 DIAGNOSIS — I480 Paroxysmal atrial fibrillation: Secondary | ICD-10-CM

## 2017-07-18 DIAGNOSIS — I10 Essential (primary) hypertension: Secondary | ICD-10-CM

## 2017-07-18 NOTE — Patient Instructions (Signed)
Medication Instructions:  No medication changes  Labwork: Have PCP check Lipids at next visit    Follow-Up: Your physician recommends that you schedule a follow-up appointment in: 6 months with Kerin Ransom, PA  Your physician wants you to follow-up in: 12 months with Dr. Oval Linsey. You will receive a reminder letter in the mail two months in advance. If you don't receive a letter, please call our office to schedule the follow-up appointment.     Any Other Special Instructions Will Be Listed Below (If Applicable).     If you need a refill on your cardiac medications before your next appointment, please call your pharmacy.

## 2017-07-18 NOTE — Progress Notes (Signed)
Cardiology Office Note   Date:  07/18/2017   ID:  [facility], DOB [birth year], MRN [id number]  PCP:  Charlott Rakes, MD  Cardiologist:   Skeet Latch, MD  Pulmonologist: Dr. Lake Bells  Chief Complaint  Patient presents with  . Follow-up  . Shortness of Breath  . Edema     History of Present Illness: [facility] is a 67 y.o. male with paroxysmal atrial fibrillation, prior PE, OSA not on CPAP, melanoma s/p excision, and GERD who presents for follow-up.  Mr. [facility] was hospitalized February 2016 with atrial fibrillation.  He developed septic shock likely due to lower external any cellulitis. He then had an episode of syncope and was brought to the emergency department via EMS. He was found to have bilateral pulmonary emboli.  During that hospitalization he had intermittent episodes of atrial fibrillation. He was anticoagulated with warfarin.  Echo revealed normal systolic function with a PASP of 57 mmHg.  He was discharged to [facility] for [facility] and lymphedema treatments. He was referred for a sleep study that revealed moderate sleep-disordered breathing.  However he has been unable to afford CPAP.  He has been feeling well and denies daytime somnolence or a.m. fatigue.  [facility] had a repeat echo 11/07/15 that revealed LVEF  60-65% with a PASP 31 mmHg.  Since his last appointment Mr. Erbe has been doing well.   Amiodarone was discontinued at his last appointment.  He denies any recurrent episodes of atrial fibrillation.  He also has not noted any chest pain or shortness of breath.  He purchased a compression leg wrap which has helped his lymphedema.  He has been able to successfully lose weight on the Atkins diet.  He also purchased a CPAP machine and has been using it for 6 months.  He wants to try the nasal pillows instead.  He is not getting as much exercise as he would like to and plans to start increasing this slowly.  He has no exertional  symptoms.   Past Medical History:  Diagnosis Date  . A-fib ([place])   . Bilateral leg edema   . Chronic kidney disease    STAGE III  . GERD (gastroesophageal reflux disease)   . History of cellulitis 05/21/15   BLE  . Hx pulmonary embolism    with hypoxic respiratory failure  . Pulmonary hypertension ([place])     Past Surgical History:  Procedure Laterality Date  . CATARACT EXTRACTION    . MELANOMA EXCISION Left 08/17/2015   Procedure: WIDE EXCISION MELANOMA  OF THE BACK ;  Surgeon: Scott Keens, MD;  Location: [place];  Service: General;  Laterality: Left;     Current Outpatient Medications  Medication Sig Dispense Refill  . atorvastatin (LIPITOR) 20 MG tablet Take 1 tablet (20 mg total) by mouth daily. 30 tablet 5  . carvedilol (COREG) 12.5 MG tablet TAKE 1 TABLET BY MOUTH 2 TIMES DAILY WITH A MEAL. 60 tablet 5  . furosemide (LASIX) 40 MG tablet Take 1 tablet (40 mg total) by mouth daily. 30 tablet 5  . Potassium Chloride ER 20 MEQ TBCR Take 20 mEq by mouth daily. 30 tablet 5  . sertraline (ZOLOFT) 50 MG tablet Take 1 tablet (50 mg total) by mouth daily. 30 tablet 3  . tamsulosin (FLOMAX) 0.4 MG CAPS capsule Take 1 capsule (0.4 mg total) by mouth daily. 30 capsule 5  . traZODone (DESYREL) 50 MG tablet Take 1 tablet (50 mg total) by  mouth at bedtime. 30 tablet 5  . warfarin (COUMADIN) 5 MG tablet TAKE 1.5 TABLETS MON, WED, FRI, AND 1 TABLET DAILY ALL OTHER DAYS. 60 tablet 2   No current facility-administered medications for this visit.     Allergies:   Patient has no known allergies.    Social History:  The patient  reports that he has never smoked. He has never used smokeless tobacco. He reports that he does not drink alcohol or use drugs.   Family History:  The patient's family history includes Cancer in his mother.    ROS:  Please see the history of present illness.   Otherwise, review of systems are positive for none.   All other systems are reviewed  and negative.    PHYSICAL EXAM: VS:  BP (!) 114/58   Pulse 82   Ht 5\' 8"  (1.727 m)   Wt (!) 387 lb (175.5 kg)   BMI 58.84 kg/m  , BMI Body mass index is 58.84 kg/m. GENERAL:  Well appearing.  Morbidly obese.  HEENT: Pupils equal round and reactive, fundi not visualized, oral mucosa unremarkable NECK:  No jugular venous distention, waveform within normal limits, carotid upstroke brisk and symmetric, no bruits LUNGS:  Clear to auscultation bilaterally HEART:  RRR.  PMI not displaced or sustained,S1 and S2 within normal limits, no S3, no S4, no clicks, no rubs, no murmurs ABD:  P distraction actually, a much needed distraction is colonic quits bowel sounds normal in frequency in pitch, no bruits, no rebound, no guarding, no midline pulsatile mass, no hepatomegaly, no splenomegaly EXT:  2 plus pulses throughout, no edema, no cyanosis no clubbing SKIN:  No rashes no nodules NEURO:  Cranial nerves II through XII grossly intact, motor grossly intact throughout PSYCH:  Cognitively intact, oriented to person place and time   EKG:  EKG is ordered today. 10/27/15: Sinus bradycardia. Rate 59 bpm. Nonspecific T wave abnormalities. 05/14/16: Sinus rhythm.  Rate 71 bpm.  PVCs.   07/18/17: Sinus rhythm.  Rate 82 bpm.  Non-specific ST-T changes/ Severe diabetic and I do dictate Echo 05/29/15: Study Conclusions  - Left ventricle: The cavity size was normal. There was mild  concentric hypertrophy. Systolic function was normal. The  estimated ejection fraction was in the range of 55% to 60%. Wall  motion was normal; there were no regional wall motion  abnormalities. - Aortic valve: Trileaflet; mildly thickened, mildly calcified  leaflets. - Mitral valve: Calcified annulus. There was mild regurgitation. - Right ventricle: Systolic function was mildly reduced. - Pulmonic valve: There was trivial regurgitation. - Pulmonary arteries: PA peak pressure: 57 mm Hg (S).  Impressions:  - The right  ventricular systolic pressure was increased consistent  with moderate pulmonary hypertension.  Echo 11/07/15: LVEF 60-65%.  Grade 1 diastolic dysfunction. Mild tricuspid regurgitation.  PASP 31 mmHg.  Recent Labs: 10/25/2016: ALT 18 01/28/2017: BUN 18; Creatinine, Ser 1.05; Potassium 4.9; Sodium 139    Lipid Panel    Component Value Date/Time   CHOL 209 (H) 10/25/2016 1201   TRIG 272 (H) 10/25/2016 1201   HDL 33 (L) 10/25/2016 1201   CHOLHDL 6.3 (H) 10/25/2016 1201   CHOLHDL 4.9 11/21/2015 0849   VLDL 44 (H) 11/21/2015 0849   LDLCALC 122 (H) 10/25/2016 1201      Wt Readings from Last 3 Encounters:  07/18/17 (!) 387 lb (175.5 kg)  05/05/17 (!) 391 lb (177.4 kg)  01/28/17 (!) 407 lb 12.8 oz (185 kg)     ASSESSMENT  AND PLAN:  # Paroxysmal atrial fibrillation: Mr. Guyette remains in sinus rhythm.  He has not experienced any recent palpitations.  Continue warfarin and discontinue amiodarone at this time. Continue carvedilol.  This patients CHA2DS2-VASc Score and unadjusted Ischemic Stroke Rate (% per year) is equal to 0.6 % stroke rate/year from a score of 1 Above score calculated as 1 point each if present [CHF, HTN, DM, Vascular=MI/PAD/Aortic Plaque, Age if 65-74, or Male] Above score calculated as 2 points each if present [Age > 75, or Stroke/TIA/TE]  # Hypertension:BP well-controlled.  Continue carvedilol.   # Pulmonary hypertension: Resolved.   Current medicines are reviewed at length with the patient today.  The patient does not have concerns regarding medicines.  The following changes have been made:  no change  Labs/ tests ordered today include:   Orders Placed This Encounter  Procedures  . EKG 12-Lead     Disposition:   FU with Austine Kelsay C. Oval Linsey, MD, [facility] in 1 year.  [facility] or [facility] in 6 months.   This note was written with the assistance of speech recognition software.  Please excuse any transcriptional errors.  Signed, Susann Lawhorne C. Oval Linsey, MD, [facility]    07/18/2017 5:27 PM    [facility]

## 2017-07-23 ENCOUNTER — Encounter: Payer: Self-pay | Admitting: Family Medicine

## 2017-07-23 ENCOUNTER — Ambulatory Visit: Payer: Commercial Managed Care - HMO | Attending: Family Medicine | Admitting: Family Medicine

## 2017-07-23 VITALS — BP 136/77 | HR 65 | Temp 98.0°F | Ht 68.0 in | Wt 368.8 lb

## 2017-07-23 DIAGNOSIS — N401 Enlarged prostate with lower urinary tract symptoms: Secondary | ICD-10-CM | POA: Diagnosis not present

## 2017-07-23 DIAGNOSIS — G4733 Obstructive sleep apnea (adult) (pediatric): Secondary | ICD-10-CM | POA: Insufficient documentation

## 2017-07-23 DIAGNOSIS — I482 Chronic atrial fibrillation, unspecified: Secondary | ICD-10-CM

## 2017-07-23 DIAGNOSIS — Z79899 Other long term (current) drug therapy: Secondary | ICD-10-CM | POA: Diagnosis not present

## 2017-07-23 DIAGNOSIS — Z86711 Personal history of pulmonary embolism: Secondary | ICD-10-CM | POA: Insufficient documentation

## 2017-07-23 DIAGNOSIS — Z7901 Long term (current) use of anticoagulants: Secondary | ICD-10-CM | POA: Insufficient documentation

## 2017-07-23 DIAGNOSIS — R350 Frequency of micturition: Secondary | ICD-10-CM | POA: Diagnosis not present

## 2017-07-23 DIAGNOSIS — I129 Hypertensive chronic kidney disease with stage 1 through stage 4 chronic kidney disease, or unspecified chronic kidney disease: Secondary | ICD-10-CM | POA: Insufficient documentation

## 2017-07-23 DIAGNOSIS — Z9889 Other specified postprocedural states: Secondary | ICD-10-CM | POA: Insufficient documentation

## 2017-07-23 DIAGNOSIS — F329 Major depressive disorder, single episode, unspecified: Secondary | ICD-10-CM | POA: Diagnosis not present

## 2017-07-23 DIAGNOSIS — I890 Lymphedema, not elsewhere classified: Secondary | ICD-10-CM | POA: Diagnosis not present

## 2017-07-23 DIAGNOSIS — I272 Pulmonary hypertension, unspecified: Secondary | ICD-10-CM | POA: Diagnosis not present

## 2017-07-23 DIAGNOSIS — N400 Enlarged prostate without lower urinary tract symptoms: Secondary | ICD-10-CM | POA: Diagnosis not present

## 2017-07-23 DIAGNOSIS — K219 Gastro-esophageal reflux disease without esophagitis: Secondary | ICD-10-CM | POA: Diagnosis not present

## 2017-07-23 DIAGNOSIS — F32A Depression, unspecified: Secondary | ICD-10-CM

## 2017-07-23 DIAGNOSIS — I480 Paroxysmal atrial fibrillation: Secondary | ICD-10-CM | POA: Insufficient documentation

## 2017-07-23 DIAGNOSIS — I10 Essential (primary) hypertension: Secondary | ICD-10-CM

## 2017-07-23 DIAGNOSIS — E876 Hypokalemia: Secondary | ICD-10-CM | POA: Diagnosis not present

## 2017-07-23 DIAGNOSIS — N189 Chronic kidney disease, unspecified: Secondary | ICD-10-CM | POA: Diagnosis not present

## 2017-07-23 DIAGNOSIS — F419 Anxiety disorder, unspecified: Secondary | ICD-10-CM

## 2017-07-23 DIAGNOSIS — I959 Hypotension, unspecified: Secondary | ICD-10-CM | POA: Diagnosis present

## 2017-07-23 MED ORDER — SERTRALINE HCL 50 MG PO TABS: 50.0000 mg | ORAL_TABLET | Freq: Every day | ORAL | 1 refills | Status: DC

## 2017-07-23 MED ORDER — ATORVASTATIN CALCIUM 20 MG PO TABS: 20.0000 mg | ORAL_TABLET | Freq: Every day | ORAL | 1 refills | Status: DC

## 2017-07-23 MED ORDER — TRAZODONE HCL 50 MG PO TABS: 50.0000 mg | ORAL_TABLET | Freq: Every day | ORAL | 1 refills | Status: DC

## 2017-07-23 MED ORDER — POTASSIUM CHLORIDE ER 20 MEQ PO TBCR: 20.0000 meq | EXTENDED_RELEASE_TABLET | Freq: Every day | ORAL | 1 refills | Status: DC

## 2017-07-23 MED ORDER — TAMSULOSIN HCL 0.4 MG PO CAPS: 0.4000 mg | ORAL_CAPSULE | Freq: Every day | ORAL | 1 refills | Status: DC

## 2017-07-23 MED ORDER — CARVEDILOL 12.5 MG PO TABS: ORAL_TABLET | ORAL | 1 refills | Status: DC

## 2017-07-23 MED ORDER — FUROSEMIDE 40 MG PO TABS: 40.0000 mg | ORAL_TABLET | Freq: Every day | ORAL | 1 refills | Status: DC

## 2017-07-23 NOTE — Patient Instructions (Signed)

## 2017-07-23 NOTE — Progress Notes (Signed)
Subjective:  Patient ID: [facility], male    DOB: [birth year]  Age: 67 y.o. MRN: [id number]  CC: Hypotension   HPI [facility]  is a 67 year old male with a history of hypertension, paroxysmal atrial fibrillation (on Coumadin), lymphedema, BPH, anxiety and depression. h/o pulmonary embolism, obstructive sleep apnea (on CPAP) who comes into the clinic for a follow-up visit.  Since his last visit 3 months ago he has lost 23 pounds and is working on losing more.  He has also noticed improvement in his pedal edema and is compliant with his compression stockings. He denies shortness of breath, chest pains and was seen by cardiology 5 days ago for follow-up of his atrial fibrillation and will be returning again in 6 months His last INR was 1.8 on 07/16/17; denies bruising, bleeding with Coumadin. He tolerates his antihypertensive and is adhering to a low sodium, DASH diet and denies adverse effects from his medications.  Anxiety and depression are controlled on his current regimen and he denies suicidal ideations or intents. Doing well on a CPAP which he uses at night for sleep apnea. He has no acute concerns at this time.  Past Medical History:  Diagnosis Date  . A-fib ([place])   . Bilateral leg edema   . Chronic kidney disease    STAGE III  . GERD (gastroesophageal reflux disease)   . History of cellulitis 05/21/15   BLE  . Hx pulmonary embolism    with hypoxic respiratory failure  . Pulmonary hypertension ([place])     Past Surgical History:  Procedure Laterality Date  . CATARACT EXTRACTION    . MELANOMA EXCISION Left 08/17/2015   Procedure: WIDE EXCISION MELANOMA  OF THE BACK ;  Surgeon: [name], MD;  Location: [place];  Service: General;  Laterality: Left;    No Known Allergies   Outpatient Medications Prior to Visit  Medication Sig Dispense Refill  . warfarin (COUMADIN) 5 MG tablet TAKE 1.5 TABLETS MON, WED, FRI, AND 1 TABLET DAILY ALL  OTHER DAYS. 60 tablet 2  . atorvastatin (LIPITOR) 20 MG tablet Take 1 tablet (20 mg total) by mouth daily. 30 tablet 5  . carvedilol (COREG) 12.5 MG tablet TAKE 1 TABLET BY MOUTH 2 TIMES DAILY WITH A MEAL. 60 tablet 5  . furosemide (LASIX) 40 MG tablet Take 1 tablet (40 mg total) by mouth daily. 30 tablet 5  . Potassium Chloride ER 20 MEQ TBCR Take 20 mEq by mouth daily. 30 tablet 5  . sertraline (ZOLOFT) 50 MG tablet Take 1 tablet (50 mg total) by mouth daily. 30 tablet 3  . tamsulosin (FLOMAX) 0.4 MG CAPS capsule Take 1 capsule (0.4 mg total) by mouth daily. 30 capsule 5  . traZODone (DESYREL) 50 MG tablet Take 1 tablet (50 mg total) by mouth at bedtime. 30 tablet 5   No facility-administered medications prior to visit.     ROS Review of Systems  Constitutional: Negative for activity change and appetite change.  HENT: Negative for sinus pressure and sore throat.   Eyes: Negative for visual disturbance.  Respiratory: Negative for cough, chest tightness and shortness of breath.   Cardiovascular: Positive for leg swelling. Negative for chest pain.  Gastrointestinal: Negative for abdominal distention, abdominal pain, constipation and diarrhea.  Endocrine: Negative.   Genitourinary: Negative for dysuria.  Musculoskeletal: Negative for joint swelling and myalgias.  Skin: Negative for rash.  Allergic/Immunologic: Negative.   Neurological: Negative for weakness, light-headedness and numbness.  Psychiatric/Behavioral:  Negative for dysphoric mood and suicidal ideas.    Objective:  BP 136/77   Pulse 65   Temp 98 F (36.7 C) (Oral)   Ht '5\' 8"'$  (1.727 m)   Wt (!) 368 lb 12.8 oz (167.3 kg)   SpO2 97%   BMI 56.08 kg/m   BP/Weight 07/23/2017 07/18/2017 4/40/3474  Systolic BP 259 563 875  Diastolic BP 77 58 74  Wt. (Lbs) 368.8 387 391  BMI 56.08 58.84 63.11      Physical Exam  Constitutional: He is oriented to person, place, and time. He appears well-developed and well-nourished.    Obese  Cardiovascular: Normal rate, normal heart sounds and intact distal pulses.  No murmur heard. Pulmonary/Chest: Effort normal and breath sounds normal. He has no wheezes. He has no rales. He exhibits no tenderness.  Abdominal: Soft. Bowel sounds are normal. He exhibits no distension and no mass. There is no tenderness.  Musculoskeletal: He exhibits edema.  Neurological: He is alert and oriented to person, place, and time.  Skin: Skin is warm and dry.  Psychiatric: He has a normal mood and affect.     Assessment & Plan:   1. Essential hypertension Controlled Low sodium, DASH diet - CMP14+EGFR; Future - Lipid panel; Future - carvedilol (COREG) 12.5 MG tablet; TAKE 1 TABLET BY MOUTH 2 TIMES DAILY WITH A MEAL.  Dispense: 180 tablet; Refill: 1  2. Lymphedema Improving with weight loss Continue compression stockings - furosemide (LASIX) 40 MG tablet; Take 1 tablet (40 mg total) by mouth daily.  Dispense: 90 tablet; Refill: 1  3. Hypokalemia Secondary to Lasix use - Potassium Chloride ER 20 MEQ TBCR; Take 20 mEq by mouth daily.  Dispense: 90 tablet; Refill: 1  4. Anxiety and depression Stable - sertraline (ZOLOFT) 50 MG tablet; Take 1 tablet (50 mg total) by mouth daily.  Dispense: 90 tablet; Refill: 1  5. Benign prostatic hyperplasia with urinary frequency Controlled - tamsulosin (FLOMAX) 0.4 MG CAPS capsule; Take 1 capsule (0.4 mg total) by mouth daily.  Dispense: 90 capsule; Refill: 1  6. Chronic atrial fibrillation (HCC) Currently in sinus rhythm Continue Coumadin which he takes for A. fib and previous history of PE Last INR was 1.8 on 07/16/17 and he has an upcoming appointment for INR check again next week with the clinical pharmacist Recently seen by cardiology and has next appointment in 6 months  7. Morbid obesity ([place]) Commended on 23 pound weight loss in the last 3 months   Meds ordered this encounter  Medications  . traZODone (DESYREL) 50 MG tablet     Sig: Take 1 tablet (50 mg total) by mouth at bedtime.    Dispense:  90 tablet    Refill:  1  . atorvastatin (LIPITOR) 20 MG tablet    Sig: Take 1 tablet (20 mg total) by mouth daily.    Dispense:  90 tablet    Refill:  1  . carvedilol (COREG) 12.5 MG tablet    Sig: TAKE 1 TABLET BY MOUTH 2 TIMES DAILY WITH A MEAL.    Dispense:  180 tablet    Refill:  1  . furosemide (LASIX) 40 MG tablet    Sig: Take 1 tablet (40 mg total) by mouth daily.    Dispense:  90 tablet    Refill:  1    Decrease previous dose  . Potassium Chloride ER 20 MEQ TBCR    Sig: Take 20 mEq by mouth daily.    Dispense:  90  tablet    Refill:  1  . sertraline (ZOLOFT) 50 MG tablet    Sig: Take 1 tablet (50 mg total) by mouth daily.    Dispense:  90 tablet    Refill:  1    Discontinue Prozac  . tamsulosin (FLOMAX) 0.4 MG CAPS capsule    Sig: Take 1 capsule (0.4 mg total) by mouth daily.    Dispense:  90 capsule    Refill:  1    Follow-up: Return in about 3 months (around 10/22/2017) for Follow-up of chronic medical conditions.   Charlott Rakes MD

## 2017-07-24 ENCOUNTER — Encounter: Payer: Self-pay | Admitting: Family Medicine

## 2017-07-30 ENCOUNTER — Encounter: Payer: Self-pay | Admitting: Pharmacist

## 2017-07-30 ENCOUNTER — Ambulatory Visit: Payer: Commercial Managed Care - HMO | Attending: Family Medicine | Admitting: Pharmacist

## 2017-07-30 ENCOUNTER — Ambulatory Visit: Payer: Commercial Managed Care - HMO

## 2017-07-30 DIAGNOSIS — I2692 Saddle embolus of pulmonary artery without acute cor pulmonale: Secondary | ICD-10-CM | POA: Diagnosis not present

## 2017-07-30 DIAGNOSIS — I482 Chronic atrial fibrillation, unspecified: Secondary | ICD-10-CM

## 2017-07-30 DIAGNOSIS — I10 Essential (primary) hypertension: Secondary | ICD-10-CM

## 2017-07-30 DIAGNOSIS — Z86711 Personal history of pulmonary embolism: Secondary | ICD-10-CM | POA: Diagnosis not present

## 2017-07-30 DIAGNOSIS — I2782 Chronic pulmonary embolism: Secondary | ICD-10-CM

## 2017-07-30 DIAGNOSIS — Z7901 Long term (current) use of anticoagulants: Secondary | ICD-10-CM | POA: Diagnosis not present

## 2017-07-30 NOTE — Patient Instructions (Addendum)
Thank you for coming to see me today. Your INR was in range so please continue to take 1.5 tablets on MWF with 1 tablet all other days.

## 2017-07-30 NOTE — Progress Notes (Signed)
    Pharmacy Anticoagulation Clinic  Subjective: Patient presents today for INR monitoring. Anticoagulation indication is pulmonary embolism/chronic atrial fibrilation.   Current dose of warfarin: Pt takes 5mg  tablets. Currently takes 1.5 tablets MWF with 1 tablet all other days.  Total weekly dose = 42.5 mg  Pt reports today that he thinks he forgot to take a dose prior to his last Coumadin visit (07/16/17).  Adherence to warfarin: Pt reports adherence Signs/symptoms of bleeding: none Recent changes in diet: none   Recent changes in medications: none Upcoming procedures that may impact anticoagulation: none   Objective: Today's INR = 2.6  Lab Results  Component Value Date   INR 1.8 07/16/2017   INR 3.0 07/03/2017   INR 2.8 06/05/2017     Assessment and Plan: Anticoagulation: Patient is therapeutic based on patient's INR of 2.6 and patient's INR goal of 2.0-3.0. Of note, he reports forgetting a dose prior to his last visit. This could explain his subtherapeutic reading of 1.8. Therefore, pt will continue current warfarin dose of 1.5 tablets MWF with 1 tablet all other days.  Patient verbalized understanding and was provided with written instructions. Next INR check planned for 08/13/17.   Benard Halsted, PharmD  [facility]  [phone]

## 2017-07-30 NOTE — Progress Notes (Signed)
Patient here for lab visit only 

## 2017-07-31 ENCOUNTER — Telehealth: Payer: Self-pay

## 2017-07-31 LAB — CMP14+EGFR
A/G RATIO: 1.7 (ref 1.2–2.2)
ALT: 20 IU/L (ref 0–44)
AST: 16 IU/L (ref 0–40)
Albumin: 4 g/dL (ref 3.6–4.8)
Alkaline Phosphatase: 136 IU/L — ABNORMAL HIGH (ref 39–117)
BUN/Creatinine Ratio: 10 (ref 10–24)
BUN: 13 mg/dL (ref 8–27)
Bilirubin Total: 0.3 mg/dL (ref 0.0–1.2)
CALCIUM: 10.2 mg/dL (ref 8.6–10.2)
CO2: 22 mmol/L (ref 20–29)
CREATININE: 1.25 mg/dL (ref 0.76–1.27)
Chloride: 106 mmol/L (ref 96–106)
GFR, EST AFRICAN AMERICAN: 69 mL/min/{1.73_m2} (ref >59)
GFR, EST NON AFRICAN AMERICAN: 60 mL/min/{1.73_m2} (ref >59)
GLUCOSE: 105 mg/dL — AB (ref 65–99)
Globulin, Total: 2.3 g/dL (ref 1.5–4.5)
Potassium: 4.9 mmol/L (ref 3.5–5.2)
Sodium: 142 mmol/L (ref 134–144)
TOTAL PROTEIN: 6.3 g/dL (ref 6.0–8.5)

## 2017-07-31 LAB — LIPID PANEL
CHOL/HDL RATIO: 5 ratio (ref 0.0–5.0)
Cholesterol, Total: 154 mg/dL (ref 100–199)
HDL: 31 mg/dL — ABNORMAL LOW (ref >39)
LDL Calculated: 77 mg/dL (ref 0–99)
TRIGLYCERIDES: 231 mg/dL — AB (ref 0–149)
VLDL Cholesterol Cal: 46 mg/dL — ABNORMAL HIGH (ref 5–40)

## 2017-07-31 NOTE — Telephone Encounter (Signed)
Patient was called and informed of lab results. 

## 2017-08-12 MED FILL — WARFARIN SODIUM 5 MG TABLET: 5 | 42 days supply | Qty: 60 | Fill #2

## 2017-08-13 ENCOUNTER — Ambulatory Visit: Payer: Commercial Managed Care - HMO | Attending: Family Medicine | Admitting: Pharmacist

## 2017-08-13 DIAGNOSIS — I2699 Other pulmonary embolism without acute cor pulmonale: Secondary | ICD-10-CM | POA: Diagnosis not present

## 2017-08-13 DIAGNOSIS — I2782 Chronic pulmonary embolism: Secondary | ICD-10-CM

## 2017-08-13 DIAGNOSIS — I2692 Saddle embolus of pulmonary artery without acute cor pulmonale: Secondary | ICD-10-CM

## 2017-08-13 DIAGNOSIS — I4891 Unspecified atrial fibrillation: Secondary | ICD-10-CM | POA: Diagnosis not present

## 2017-08-13 DIAGNOSIS — Z7901 Long term (current) use of anticoagulants: Secondary | ICD-10-CM | POA: Diagnosis not present

## 2017-08-13 DIAGNOSIS — I482 Chronic atrial fibrillation, unspecified: Secondary | ICD-10-CM

## 2017-08-13 LAB — POCT INR: INR: 2.2

## 2017-09-03 ENCOUNTER — Encounter: Payer: Self-pay | Admitting: Pharmacist

## 2017-09-10 ENCOUNTER — Ambulatory Visit: Payer: Commercial Managed Care - HMO | Attending: Family Medicine | Admitting: Pharmacist

## 2017-09-10 DIAGNOSIS — I2699 Other pulmonary embolism without acute cor pulmonale: Secondary | ICD-10-CM | POA: Insufficient documentation

## 2017-09-10 DIAGNOSIS — I2692 Saddle embolus of pulmonary artery without acute cor pulmonale: Secondary | ICD-10-CM

## 2017-09-10 DIAGNOSIS — I2782 Chronic pulmonary embolism: Secondary | ICD-10-CM

## 2017-09-10 DIAGNOSIS — I4891 Unspecified atrial fibrillation: Secondary | ICD-10-CM | POA: Insufficient documentation

## 2017-09-10 DIAGNOSIS — I482 Chronic atrial fibrillation, unspecified: Secondary | ICD-10-CM

## 2017-09-10 LAB — POCT INR: INR: 2.2 (ref 2.0–3.0)

## 2017-10-06 MED FILL — WARFARIN SODIUM 5 MG TABLET: 5 | 30 days supply | Qty: 60 | Fill #0

## 2017-10-08 ENCOUNTER — Telehealth: Payer: Self-pay | Admitting: Pharmacist

## 2017-10-08 ENCOUNTER — Encounter: Payer: Self-pay | Admitting: Pharmacist

## 2017-10-08 NOTE — Telephone Encounter (Signed)
Created in error

## 2017-10-22 ENCOUNTER — Ambulatory Visit: Payer: Self-pay | Admitting: Family Medicine

## 2017-12-01 MED FILL — WARFARIN SODIUM 5 MG TABLET: 5 | 30 days supply | Qty: 60 | Fill #1

## 2018-01-07 ENCOUNTER — Other Ambulatory Visit: Payer: Self-pay | Admitting: Family Medicine

## 2018-01-07 DIAGNOSIS — I10 Essential (primary) hypertension: Secondary | ICD-10-CM

## 2018-01-07 DIAGNOSIS — E7800 Pure hypercholesterolemia, unspecified: Secondary | ICD-10-CM

## 2018-01-07 DIAGNOSIS — N401 Enlarged prostate with lower urinary tract symptoms: Secondary | ICD-10-CM

## 2018-01-07 DIAGNOSIS — F329 Major depressive disorder, single episode, unspecified: Secondary | ICD-10-CM

## 2018-01-07 DIAGNOSIS — R350 Frequency of micturition: Secondary | ICD-10-CM

## 2018-01-07 DIAGNOSIS — F419 Anxiety disorder, unspecified: Secondary | ICD-10-CM

## 2018-01-07 DIAGNOSIS — I890 Lymphedema, not elsewhere classified: Secondary | ICD-10-CM

## 2018-01-13 ENCOUNTER — Encounter: Payer: Self-pay | Admitting: Pharmacist

## 2018-01-21 MED FILL — WARFARIN SODIUM 5 MG TABLET: 5 | 30 days supply | Qty: 60 | Fill #2

## 2018-01-29 ENCOUNTER — Ambulatory Visit: Payer: Medicare HMO | Attending: Family Medicine | Admitting: Pharmacist

## 2018-01-29 DIAGNOSIS — I2692 Saddle embolus of pulmonary artery without acute cor pulmonale: Secondary | ICD-10-CM | POA: Diagnosis not present

## 2018-01-29 DIAGNOSIS — I4891 Unspecified atrial fibrillation: Secondary | ICD-10-CM | POA: Diagnosis not present

## 2018-01-29 DIAGNOSIS — Z7901 Long term (current) use of anticoagulants: Secondary | ICD-10-CM | POA: Insufficient documentation

## 2018-01-29 LAB — POCT INR: INR: 1.7 — AB (ref 2.0–3.0)

## 2018-02-05 ENCOUNTER — Encounter: Payer: Self-pay | Admitting: Pharmacist

## 2018-02-12 ENCOUNTER — Ambulatory Visit: Payer: Medicare HMO | Attending: Family Medicine | Admitting: Pharmacist

## 2018-02-12 DIAGNOSIS — Z7901 Long term (current) use of anticoagulants: Secondary | ICD-10-CM | POA: Diagnosis not present

## 2018-02-12 DIAGNOSIS — Z5181 Encounter for therapeutic drug level monitoring: Secondary | ICD-10-CM | POA: Insufficient documentation

## 2018-02-12 DIAGNOSIS — I4891 Unspecified atrial fibrillation: Secondary | ICD-10-CM | POA: Diagnosis not present

## 2018-02-12 DIAGNOSIS — I2692 Saddle embolus of pulmonary artery without acute cor pulmonale: Secondary | ICD-10-CM

## 2018-02-12 LAB — POCT INR: INR: 2.2 (ref 2.0–3.0)

## 2018-03-02 ENCOUNTER — Ambulatory Visit: Payer: Self-pay | Admitting: Family Medicine

## 2018-03-09 ENCOUNTER — Other Ambulatory Visit: Payer: Self-pay | Admitting: Internal Medicine

## 2018-03-09 DIAGNOSIS — I2692 Saddle embolus of pulmonary artery without acute cor pulmonale: Secondary | ICD-10-CM

## 2018-03-09 MED FILL — WARFARIN SODIUM 5 MG TABLET: 5 | 60 days supply | Qty: 60 | Fill #0

## 2018-03-12 ENCOUNTER — Encounter: Payer: Self-pay | Admitting: Pharmacist

## 2018-03-16 ENCOUNTER — Other Ambulatory Visit: Payer: Self-pay | Admitting: Family Medicine

## 2018-03-16 DIAGNOSIS — I10 Essential (primary) hypertension: Secondary | ICD-10-CM

## 2018-03-16 DIAGNOSIS — I890 Lymphedema, not elsewhere classified: Secondary | ICD-10-CM

## 2018-03-16 DIAGNOSIS — F419 Anxiety disorder, unspecified: Secondary | ICD-10-CM

## 2018-03-16 DIAGNOSIS — E7800 Pure hypercholesterolemia, unspecified: Secondary | ICD-10-CM

## 2018-03-16 DIAGNOSIS — F329 Major depressive disorder, single episode, unspecified: Secondary | ICD-10-CM

## 2018-03-16 DIAGNOSIS — N401 Enlarged prostate with lower urinary tract symptoms: Secondary | ICD-10-CM

## 2018-03-16 DIAGNOSIS — R350 Frequency of micturition: Principal | ICD-10-CM

## 2018-03-17 ENCOUNTER — Ambulatory Visit: Payer: Self-pay | Admitting: Family Medicine

## 2018-03-24 ENCOUNTER — Telehealth: Payer: Self-pay | Admitting: Pharmacist

## 2018-03-24 DIAGNOSIS — E7800 Pure hypercholesterolemia, unspecified: Secondary | ICD-10-CM

## 2018-03-24 DIAGNOSIS — F329 Major depressive disorder, single episode, unspecified: Secondary | ICD-10-CM

## 2018-03-24 DIAGNOSIS — I10 Essential (primary) hypertension: Secondary | ICD-10-CM

## 2018-03-24 DIAGNOSIS — F419 Anxiety disorder, unspecified: Secondary | ICD-10-CM

## 2018-03-24 DIAGNOSIS — F32A Depression, unspecified: Secondary | ICD-10-CM

## 2018-03-24 MED ORDER — SERTRALINE HCL 50 MG PO TABS: 50.0000 mg | ORAL_TABLET | Freq: Every day | ORAL | 0 refills | Status: DC

## 2018-03-24 MED ORDER — CARVEDILOL 12.5 MG PO TABS: 12.5000 mg | ORAL_TABLET | Freq: Two times a day (BID) | ORAL | 0 refills | Status: DC

## 2018-03-24 MED ORDER — ATORVASTATIN CALCIUM 20 MG PO TABS: 20.0000 mg | ORAL_TABLET | Freq: Every day | ORAL | 0 refills | Status: DC

## 2018-03-24 MED FILL — SERTRALINE HCL 50 MG TABS: 50 | 30 days supply | Qty: 30 | Fill #0

## 2018-03-24 MED FILL — CARVEDILOL 12.5 MG TABLET: 12.5 | 30 days supply | Qty: 60 | Fill #0

## 2018-03-24 MED FILL — ATORVASTATIN 20 MG TABLET: 20 | 30 days supply | Qty: 30 | Fill #0

## 2018-03-24 NOTE — Telephone Encounter (Signed)
PC received today. Patient reports being refused refills d/t not keeping appointments. He is requesting refills for carvedilol, sertraline, and atorvastatin. Will refill for 1 month supply as he has an appointment scheduled for 04/21/17.

## 2018-04-21 ENCOUNTER — Other Ambulatory Visit: Payer: Self-pay | Admitting: Pharmacist

## 2018-04-21 ENCOUNTER — Encounter: Payer: Self-pay | Admitting: Family Medicine

## 2018-04-21 ENCOUNTER — Ambulatory Visit: Payer: Medicare HMO | Attending: Family Medicine | Admitting: Family Medicine

## 2018-04-21 VITALS — BP 148/66 | HR 73 | Temp 97.9°F | Ht 68.0 in | Wt >= 6400 oz

## 2018-04-21 DIAGNOSIS — E669 Obesity, unspecified: Secondary | ICD-10-CM

## 2018-04-21 DIAGNOSIS — N401 Enlarged prostate with lower urinary tract symptoms: Secondary | ICD-10-CM

## 2018-04-21 DIAGNOSIS — Z6841 Body Mass Index (BMI) 40.0 and over, adult: Secondary | ICD-10-CM

## 2018-04-21 DIAGNOSIS — I10 Essential (primary) hypertension: Secondary | ICD-10-CM | POA: Diagnosis not present

## 2018-04-21 DIAGNOSIS — I890 Lymphedema, not elsewhere classified: Secondary | ICD-10-CM | POA: Diagnosis not present

## 2018-04-21 DIAGNOSIS — E876 Hypokalemia: Secondary | ICD-10-CM | POA: Diagnosis not present

## 2018-04-21 DIAGNOSIS — E7800 Pure hypercholesterolemia, unspecified: Secondary | ICD-10-CM | POA: Diagnosis not present

## 2018-04-21 DIAGNOSIS — R350 Frequency of micturition: Secondary | ICD-10-CM | POA: Diagnosis not present

## 2018-04-21 DIAGNOSIS — F329 Major depressive disorder, single episode, unspecified: Secondary | ICD-10-CM

## 2018-04-21 DIAGNOSIS — F419 Anxiety disorder, unspecified: Secondary | ICD-10-CM | POA: Diagnosis not present

## 2018-04-21 DIAGNOSIS — I2782 Chronic pulmonary embolism: Secondary | ICD-10-CM

## 2018-04-21 DIAGNOSIS — I2692 Saddle embolus of pulmonary artery without acute cor pulmonale: Secondary | ICD-10-CM

## 2018-04-21 DIAGNOSIS — F32A Depression, unspecified: Secondary | ICD-10-CM

## 2018-04-21 DIAGNOSIS — I4891 Unspecified atrial fibrillation: Secondary | ICD-10-CM

## 2018-04-21 LAB — POCT INR: INR: 1.8 — AB (ref 2.0–3.0)

## 2018-04-21 MED ORDER — CARVEDILOL 12.5 MG PO TABS: 12.5000 mg | ORAL_TABLET | Freq: Two times a day (BID) | ORAL | 1 refills | Status: DC

## 2018-04-21 MED ORDER — WARFARIN SODIUM 5 MG PO TABS: ORAL_TABLET | ORAL | 2 refills | Status: DC

## 2018-04-21 MED ORDER — ATORVASTATIN CALCIUM 20 MG PO TABS: 20.0000 mg | ORAL_TABLET | Freq: Every day | ORAL | 1 refills | Status: DC

## 2018-04-21 MED ORDER — SERTRALINE HCL 50 MG PO TABS: 50.0000 mg | ORAL_TABLET | Freq: Every day | ORAL | 1 refills | Status: DC

## 2018-04-21 MED ORDER — TRAZODONE HCL 50 MG PO TABS: 50.0000 mg | ORAL_TABLET | Freq: Every day | ORAL | 1 refills | Status: AC

## 2018-04-21 MED ORDER — FUROSEMIDE 40 MG PO TABS: 40.0000 mg | ORAL_TABLET | Freq: Every day | ORAL | 1 refills | Status: DC

## 2018-04-21 MED ORDER — TAMSULOSIN HCL 0.4 MG PO CAPS: 0.4000 mg | ORAL_CAPSULE | Freq: Every day | ORAL | 1 refills | Status: DC

## 2018-04-21 MED ORDER — POTASSIUM CHLORIDE ER 20 MEQ PO TBCR: 20.0000 meq | EXTENDED_RELEASE_TABLET | Freq: Every day | ORAL | 1 refills | Status: DC

## 2018-04-21 MED ORDER — FINASTERIDE 5 MG PO TABS: 5.0000 mg | ORAL_TABLET | Freq: Every day | ORAL | 1 refills | Status: DC

## 2018-04-21 MED FILL — WARFARIN SODIUM 5 MG TABLET: 5 | 30 days supply | Qty: 60 | Fill #1

## 2018-04-21 NOTE — Progress Notes (Signed)
Subjective:  Patient ID: [facility], male    DOB: 01-22-1951  Age: 68 y.o. MRN: [id number]  CC: Hypertension   HPI [facility]   is a 68 year old male with a history of hypertension, paroxysmal atrial fibrillation (on Coumadin), lymphedema, BPH, anxiety and depression. h/o pulmonary embolism, obstructive sleep apnea (on CPAP) who comes into the clinic for a follow-up visit. He is currently on Flomax but complains of urge incontinence and sometimes having accidents on himself.  This led to his discontinuation of Lasix and Flomax altogether. His pedal edema has worsened since stopping Lasix and he has not been compliant with his compression stockings, low-sodium diet and has been over indulging over the holidays. His depression is stable.  Compliant with Coumadin and his INR is 1.8; denies recent change in medication regimen or overly ingesting greens.  Denies bruising or bleeding from his gums, hematuria, hematochezia.   Past Medical History:  Diagnosis Date  . A-fib ([place])   . Bilateral leg edema   . Chronic kidney disease    STAGE III  . GERD (gastroesophageal reflux disease)   . History of cellulitis 05/21/15   BLE  . Hx pulmonary embolism    with hypoxic respiratory failure  . Pulmonary hypertension ([place])     Past Surgical History:  Procedure Laterality Date  . CATARACT EXTRACTION    . MELANOMA EXCISION Left 08/17/2015   Procedure: WIDE EXCISION MELANOMA  OF THE BACK ;  Surgeon: [name], MD;  Location: [place];  Service: General;  Laterality: Left;    No Known Allergies   Outpatient Medications Prior to Visit  Medication Sig Dispense Refill  . warfarin (COUMADIN) 5 MG tablet TAKE 1 & 1/2 TABLETS BY MOUTH MONDAY, WEDNESDAY, & FRIDAY, & 1 TABLET DAILY ALL OTHER DAYS. 60 tablet 2  . atorvastatin (LIPITOR) 20 MG tablet Take 1 tablet (20 mg total) by mouth daily. 30 tablet 0  . carvedilol (COREG) 12.5 MG tablet Take 1 tablet (12.5  mg total) by mouth 2 (two) times daily with a meal. 60 tablet 0  . furosemide (LASIX) 40 MG tablet TAKE 1 TABLET (40 MG TOTAL) BY MOUTH DAILY. 90 tablet 0  . Potassium Chloride ER 20 MEQ TBCR Take 20 mEq by mouth daily. 90 tablet 1  . potassium chloride SA (K-DUR,KLOR-CON) 20 MEQ tablet TAKE 1 TABLET EVERY DAY 90 tablet 0  . sertraline (ZOLOFT) 50 MG tablet Take 1 tablet (50 mg total) by mouth daily. 30 tablet 0  . tamsulosin (FLOMAX) 0.4 MG CAPS capsule TAKE 1 CAPSULE (0.4 MG TOTAL) BY MOUTH DAILY. 90 capsule 0  . traZODone (DESYREL) 50 MG tablet Take 1 tablet (50 mg total) by mouth at bedtime. 90 tablet 1   No facility-administered medications prior to visit.     ROS Review of Systems  Constitutional: Negative for activity change and appetite change.  HENT: Negative for sinus pressure and sore throat.   Eyes: Negative for visual disturbance.  Respiratory: Negative for cough, chest tightness and shortness of breath.   Cardiovascular: Positive for leg swelling. Negative for chest pain.  Gastrointestinal: Negative for abdominal distention, abdominal pain, constipation and diarrhea.  Endocrine: Negative.   Genitourinary: Negative for dysuria.  Musculoskeletal: Negative for joint swelling and myalgias.  Skin: Negative for rash.  Allergic/Immunologic: Negative.   Neurological: Negative for weakness, light-headedness and numbness.  Psychiatric/Behavioral: Negative for dysphoric mood and suicidal ideas.    Objective:  BP (!) 148/66   Pulse 73  Temp 97.9 F (36.6 C) (Oral)   Ht '5\' 8"'$  (1.727 m)   Wt (!) 428 lb 9.6 oz (194.4 kg)   SpO2 96%   BMI 65.17 kg/m   BP/Weight 04/21/2018 07/23/2017 04/10/7251  Systolic BP 664 403 474  Diastolic BP 66 77 58  Wt. (Lbs) 428.6 368.8 387  BMI 65.17 56.08 58.84      Physical Exam Constitutional:      Appearance: He is well-developed. He is obese.  Cardiovascular:     Rate and Rhythm: Normal rate.     Heart sounds: Normal heart sounds. No  murmur.  Pulmonary:     Effort: Pulmonary effort is normal.     Breath sounds: Normal breath sounds. No wheezing or rales.  Chest:     Chest wall: No tenderness.  Abdominal:     General: Bowel sounds are normal. There is no distension.     Palpations: Abdomen is soft. There is no mass.     Tenderness: There is no abdominal tenderness.  Musculoskeletal: Normal range of motion.     Right lower leg: Edema present.     Left lower leg: Edema present.  Neurological:     Mental Status: He is alert and oriented to person, place, and time.  Psychiatric:        Mood and Affect: Mood normal.      Assessment & Plan:   1. Benign prostatic hyperplasia with urinary frequency Uncontrolled with urge incontinence Proscar added to regimen If symptoms persist, will refer to urology - tamsulosin (FLOMAX) 0.4 MG CAPS capsule; Take 1 capsule (0.4 mg total) by mouth daily.  Dispense: 90 capsule; Refill: 1 - CMP14+EGFR; Future  2. Anxiety and depression Controlled - sertraline (ZOLOFT) 50 MG tablet; Take 1 tablet (50 mg total) by mouth daily.  Dispense: 90 tablet; Refill: 1  3. Hypokalemia Secondary to Lasix use - Potassium Chloride ER 20 MEQ TBCR; Take 20 mEq by mouth daily.  Dispense: 90 tablet; Refill: 1  4. Lymphedema Has been noncompliant with compression stockings and Lasix Encouraged to comply with a low-sodium diet, elevate feet, comply with compression stockings and Lasix - furosemide (LASIX) 40 MG tablet; Take 1 tablet (40 mg total) by mouth daily.  Dispense: 90 tablet; Refill: 1  5. Essential hypertension Slightly above goal New regimen change today Counseled on blood pressure goal of less than 130/80, low-sodium, DASH diet, medication compliance, 150 minutes of moderate intensity exercise per week. Discussed medication compliance, adverse effects. - carvedilol (COREG) 12.5 MG tablet; Take 1 tablet (12.5 mg total) by mouth 2 (two) times daily with a meal.  Dispense: 180 tablet;  Refill: 1  6. Pure hypercholesterolemia Stable - atorvastatin (LIPITOR) 20 MG tablet; Take 1 tablet (20 mg total) by mouth daily.  Dispense: 90 tablet; Refill: 1 - Lipid panel; Future  7. Atrial fibrillation, unspecified type (HCC) INR 1.8 Increase Coumadin dosing to 5 mg on Tuesday and Thursday and 7.5 mg on other days Will see CPP in 2 weeks for recheck - POCT INR   Meds ordered this encounter  Medications  . finasteride (PROSCAR) 5 MG tablet    Sig: Take 1 tablet (5 mg total) by mouth daily.    Dispense:  90 tablet    Refill:  1  . traZODone (DESYREL) 50 MG tablet    Sig: Take 1 tablet (50 mg total) by mouth at bedtime.    Dispense:  90 tablet    Refill:  1  . tamsulosin (FLOMAX) 0.4  MG CAPS capsule    Sig: Take 1 capsule (0.4 mg total) by mouth daily.    Dispense:  90 capsule    Refill:  1  . sertraline (ZOLOFT) 50 MG tablet    Sig: Take 1 tablet (50 mg total) by mouth daily.    Dispense:  90 tablet    Refill:  1  . Potassium Chloride ER 20 MEQ TBCR    Sig: Take 20 mEq by mouth daily.    Dispense:  90 tablet    Refill:  1  . furosemide (LASIX) 40 MG tablet    Sig: Take 1 tablet (40 mg total) by mouth daily.    Dispense:  90 tablet    Refill:  1  . carvedilol (COREG) 12.5 MG tablet    Sig: Take 1 tablet (12.5 mg total) by mouth 2 (two) times daily with a meal.    Dispense:  180 tablet    Refill:  1  . atorvastatin (LIPITOR) 20 MG tablet    Sig: Take 1 tablet (20 mg total) by mouth daily.    Dispense:  90 tablet    Refill:  1    Follow-up: Return in about 2 weeks (around 05/05/2018) for With [facility] for INR; 3 months with PCP.   Charlott Rakes MD

## 2018-04-21 NOTE — Patient Instructions (Signed)

## 2018-04-21 NOTE — Progress Notes (Signed)
INR 1.8

## 2018-04-22 MED FILL — traZODone HCL 50 MG TABS: 50 | 90 days supply | Qty: 90 | Fill #0

## 2018-04-22 MED FILL — POTASSIUM CL ER 20 MEQ TAB: 20 | 90 days supply | Qty: 90 | Fill #0

## 2018-04-22 MED FILL — CARVEDILOL 12.5 MG TABLET: 12.5 | 90 days supply | Qty: 180 | Fill #0

## 2018-04-22 MED FILL — ATORVASTATIN 20 MG TABLET: 20 | 90 days supply | Qty: 90 | Fill #0

## 2018-04-22 MED FILL — FINASTERIDE 5 MG TABLET: 5 | 90 days supply | Qty: 90 | Fill #0

## 2018-04-22 MED FILL — TAMSULOSIN HCL 0.4 MG CAP: 0.4 | 90 days supply | Qty: 90 | Fill #0

## 2018-04-22 MED FILL — SERTRALINE HCL 50 MG TABS: 50 | 90 days supply | Qty: 90 | Fill #0

## 2018-04-22 MED FILL — FUROSEMIDE 40 MG TAB: 40 | 90 days supply | Qty: 90 | Fill #0

## 2018-05-07 ENCOUNTER — Ambulatory Visit: Payer: Medicare HMO | Attending: Family Medicine | Admitting: Pharmacist

## 2018-05-07 DIAGNOSIS — I4891 Unspecified atrial fibrillation: Secondary | ICD-10-CM

## 2018-05-07 DIAGNOSIS — I2782 Chronic pulmonary embolism: Secondary | ICD-10-CM

## 2018-05-07 DIAGNOSIS — R350 Frequency of micturition: Secondary | ICD-10-CM | POA: Diagnosis not present

## 2018-05-07 DIAGNOSIS — Z7901 Long term (current) use of anticoagulants: Secondary | ICD-10-CM | POA: Insufficient documentation

## 2018-05-07 DIAGNOSIS — E7800 Pure hypercholesterolemia, unspecified: Secondary | ICD-10-CM | POA: Diagnosis not present

## 2018-05-07 DIAGNOSIS — N401 Enlarged prostate with lower urinary tract symptoms: Secondary | ICD-10-CM | POA: Diagnosis not present

## 2018-05-07 DIAGNOSIS — I2692 Saddle embolus of pulmonary artery without acute cor pulmonale: Secondary | ICD-10-CM | POA: Insufficient documentation

## 2018-05-07 LAB — POCT INR: INR: 1.7 — AB (ref 2.0–3.0)

## 2018-05-08 LAB — CMP14+EGFR
ALT: 15 IU/L (ref 0–44)
AST: 13 IU/L (ref 0–40)
Albumin/Globulin Ratio: 1.6 (ref 1.2–2.2)
Albumin: 3.9 g/dL (ref 3.8–4.8)
Alkaline Phosphatase: 116 IU/L (ref 39–117)
BUN/Creatinine Ratio: 12 (ref 10–24)
BUN: 13 mg/dL (ref 8–27)
Bilirubin Total: 0.3 mg/dL (ref 0.0–1.2)
CO2: 26 mmol/L (ref 20–29)
CREATININE: 1.06 mg/dL (ref 0.76–1.27)
Calcium: 10.3 mg/dL — ABNORMAL HIGH (ref 8.6–10.2)
Chloride: 97 mmol/L (ref 96–106)
GFR calc Af Amer: 85 mL/min/{1.73_m2} (ref >59)
GFR calc non Af Amer: 73 mL/min/{1.73_m2} (ref >59)
Globulin, Total: 2.5 g/dL (ref 1.5–4.5)
Glucose: 104 mg/dL — ABNORMAL HIGH (ref 65–99)
Potassium: 5.2 mmol/L (ref 3.5–5.2)
Sodium: 135 mmol/L (ref 134–144)
Total Protein: 6.4 g/dL (ref 6.0–8.5)

## 2018-05-08 LAB — LIPID PANEL
Chol/HDL Ratio: 3.3 ratio (ref 0.0–5.0)
Cholesterol, Total: 112 mg/dL (ref 100–199)
HDL: 34 mg/dL — ABNORMAL LOW (ref >39)
LDL CALC: 55 mg/dL (ref 0–99)
Triglycerides: 117 mg/dL (ref 0–149)
VLDL Cholesterol Cal: 23 mg/dL (ref 5–40)

## 2018-05-11 ENCOUNTER — Telehealth: Payer: Self-pay

## 2018-05-11 NOTE — Telephone Encounter (Signed)
-----   Message from Charlott Rakes, MD sent at 05/08/2018  2:19 PM EST ----- Please inform the patient that labs are normal. Thank you.

## 2018-05-11 NOTE — Telephone Encounter (Signed)
Patient was called and informed of lab results. 

## 2018-05-20 MED FILL — WARFARIN SODIUM 5 MG TABLET: 5 | 30 days supply | Qty: 60 | Fill #2

## 2018-05-21 ENCOUNTER — Ambulatory Visit: Payer: Medicare HMO | Attending: Family Medicine | Admitting: Pharmacist

## 2018-05-21 DIAGNOSIS — I4891 Unspecified atrial fibrillation: Secondary | ICD-10-CM

## 2018-05-21 DIAGNOSIS — I2782 Chronic pulmonary embolism: Secondary | ICD-10-CM

## 2018-05-21 DIAGNOSIS — I2692 Saddle embolus of pulmonary artery without acute cor pulmonale: Secondary | ICD-10-CM

## 2018-05-21 LAB — POCT INR: INR: 3.4 — AB (ref 2.0–3.0)

## 2018-06-11 ENCOUNTER — Encounter: Payer: Self-pay | Admitting: Pharmacist

## 2018-07-13 ENCOUNTER — Other Ambulatory Visit: Payer: Self-pay | Admitting: Pharmacist

## 2018-07-13 DIAGNOSIS — I2692 Saddle embolus of pulmonary artery without acute cor pulmonale: Secondary | ICD-10-CM

## 2018-07-13 MED ORDER — WARFARIN SODIUM 5 MG PO TABS: ORAL_TABLET | ORAL | 2 refills | Status: DC

## 2018-07-28 ENCOUNTER — Other Ambulatory Visit: Payer: Self-pay | Admitting: Pharmacist

## 2018-07-28 ENCOUNTER — Telehealth: Payer: Self-pay | Admitting: Cardiovascular Disease

## 2018-07-28 DIAGNOSIS — R350 Frequency of micturition: Principal | ICD-10-CM

## 2018-07-28 DIAGNOSIS — N401 Enlarged prostate with lower urinary tract symptoms: Secondary | ICD-10-CM

## 2018-07-28 MED ORDER — TAMSULOSIN HCL 0.4 MG PO CAPS: 0.4000 mg | ORAL_CAPSULE | Freq: Every day | ORAL | 0 refills | Status: AC

## 2018-07-28 MED ORDER — FINASTERIDE 5 MG PO TABS: 5.0000 mg | ORAL_TABLET | Freq: Every day | ORAL | 0 refills | Status: AC

## 2018-07-28 NOTE — Telephone Encounter (Signed)
Smartphone/ my chart via email/ virtual consent/ pre reg completed  °

## 2018-07-29 ENCOUNTER — Telehealth (INDEPENDENT_AMBULATORY_CARE_PROVIDER_SITE_OTHER): Payer: Medicare HMO | Admitting: Cardiovascular Disease

## 2018-07-29 ENCOUNTER — Telehealth: Payer: Self-pay | Admitting: *Deleted

## 2018-07-29 DIAGNOSIS — I890 Lymphedema, not elsewhere classified: Secondary | ICD-10-CM | POA: Diagnosis not present

## 2018-07-29 DIAGNOSIS — I10 Essential (primary) hypertension: Secondary | ICD-10-CM

## 2018-07-29 DIAGNOSIS — I480 Paroxysmal atrial fibrillation: Secondary | ICD-10-CM | POA: Diagnosis not present

## 2018-07-29 DIAGNOSIS — I493 Ventricular premature depolarization: Secondary | ICD-10-CM

## 2018-07-29 NOTE — Telephone Encounter (Signed)
Called and spoke with patient in reference to his CPAP machine and OSA diagnosis. He informs me that his sleep study he thins was done through [facility] ( which I think we have a copy of)  The CPAP machine he has he doesn't know how old it is because it was used. I explained to the patient that because I don't have all of the documentation needed such as downloads to show that he is compliant nor do I his initial note from the MD or a compliance visit note he will have to start all over with getting a new sleep study. I recommended to him to call me back in approximately 1 month to check the status of where the sleep lab is with scheduling. Patient agrees with plan and was given my direct contact phone #

## 2018-07-29 NOTE — Telephone Encounter (Signed)
done

## 2018-07-29 NOTE — Patient Instructions (Signed)
Medication Instructions:  Your physician recommends tat you continue on your current medications as directed. Please refer to the Current Medication list given to you today.  If you need a refill on your cardiac medications before your next appointment, please call your pharmacy.   Lab work: NONE  Testing/Procedures: NONE  Follow-Up: At [facility], you and your health needs are our priority.  As part of our continuing mission to provide you with exceptional heart care, we have created designated Provider Care Teams.  These Care Teams include your primary Cardiologist (physician) and Advanced Practice Providers (APPs -  Physician Assistants and Nurse Practitioners) who all work together to provide you with the care you need, when you need it. You will need a follow up appointment in 12 months.  Please call our office 2 months in advance to schedule this appointment.  You may see Skeet Latch, MD or one of the following Advanced Practice Providers on your designated Care Team:   Kerin Ransom, PA-C Roby Lofts, [place] . Sande Rives, PA-C  Any Other Special Instructions Will Be Listed Below (If Applicable). WILL HAVE WANDA CALL YOU REGARDING NEW CPAP MACHINE  WILL CALL YOU REGARDING ARRANGEMENT FOR PNEUMATIC COMPRESSION DEVICE

## 2018-07-29 NOTE — Progress Notes (Addendum)
Virtual Visit via Phone Note   This visit type was conducted due to national recommendations for restrictions regarding the COVID-19 Pandemic (e.g. social distancing) in an effort to limit this patient's exposure and mitigate transmission in our community.  Due to his co-morbid illnesses, this patient is at least at moderate risk for complications without adequate follow up.  This format is felt to be most appropriate for this patient at this time.  All issues noted in this document were discussed and addressed.  A limited physical exam was performed with this format.  Please refer to the patient's chart for his consent to telehealth for [facility].   Evaluation Performed:  Follow-up visit  Date:  07/29/2018   ID:  Scott Shelton, DOB 1950-10-03, MRN [id number]  Patient Location: Home Provider Location: Office  PCP:  Charlott Rakes, MD  Cardiologist:  Skeet Latch, MD  Electrophysiologist:  None   Chief Complaint:  Follow up  History of Present Illness:    Scott Shelton is a 68 y.o. male with paroxysmal atrial fibrillation, prior PE, OSA not on CPAP, melanoma s/p excision, and GERD who presents for follow-up.  Scott Shelton was hospitalized February 2016 with atrial fibrillation.  He developed septic shock likely due to lower external any cellulitis. He then had an episode of syncope and was brought to the emergency department via EMS. He was found to have bilateral pulmonary emboli.  During that hospitalization he had intermittent episodes of atrial fibrillation. He was anticoagulated with warfarin.  Echo revealed normal systolic function with a PASP of 57 mmHg.  He was discharged to [facility] for rehabilitation and lymphedema treatments. He was referred for a sleep study that revealed moderate sleep-disordered breathing.  However he has been unable to afford CPAP.  He has been feeling well and denies daytime somnolence or a.m. fatigue.  Scott Shelton had a repeat echo  11/07/15 that revealed LVEF  60-65% with a PASP 31 mmHg.  Since his last appointment Scott Shelton has been doing well. He continues to have significant lymphedema.  He tries wearing compression socks without much improvement.  He continues using his CPAP device but doesn't think it has been helpful.  He has a machine that he purchased when he didn't have many funds but wants to get a better machine now that his finances have improved.  Overall he has been doing fairly well.  He doesn't get much formal exercise but is trying to walk around more in his home.  He has no orthopnea or PND and his breathing has been stable.    The patient does not have symptoms concerning for COVID-19 infection (fever, chills, cough, or new shortness of breath).    Past Medical History:  Diagnosis Date   A-fib ([place])    Bilateral leg edema    Chronic kidney disease    STAGE III   GERD (gastroesophageal reflux disease)    History of cellulitis 05/21/15   BLE   Hx pulmonary embolism    with hypoxic respiratory failure   Pulmonary hypertension ([place])    Past Surgical History:  Procedure Laterality Date   CATARACT EXTRACTION     MELANOMA EXCISION Left 08/17/2015   Procedure: WIDE EXCISION MELANOMA  OF THE BACK ;  Surgeon: [name], MD;  Location: [facility];  Service: General;  Laterality: Left;     Current Meds  Medication Sig   atorvastatin (LIPITOR) 20 MG tablet Take 1 tablet (20 mg total) by mouth daily.  carvedilol (COREG) 12.5 MG tablet Take 1 tablet (12.5 mg total) by mouth 2 (two) times daily with a meal.   finasteride (PROSCAR) 5 MG tablet Take 1 tablet (5 mg total) by mouth daily.   furosemide (LASIX) 40 MG tablet Take 1 tablet (40 mg total) by mouth daily.   Potassium Chloride ER 20 MEQ TBCR Take 20 mEq by mouth daily.   sertraline (ZOLOFT) 50 MG tablet Take 1 tablet (50 mg total) by mouth daily.   tamsulosin (FLOMAX) 0.4 MG CAPS capsule Take 1 capsule (0.4 mg  total) by mouth daily.   traZODone (DESYREL) 50 MG tablet Take 1 tablet (50 mg total) by mouth at bedtime. (Patient taking differently: Take 50 mg by mouth at bedtime as needed. )   warfarin (COUMADIN) 5 MG tablet TAKE 1 & 1/2 TABLETS BY MOUTH MONDAY, WEDNESDAY, & FRIDAY, & 1 TABLET DAILY ALL OTHER DAYS.     Allergies:   Patient has no known allergies.   Social History   Tobacco Use   Smoking status: Never Smoker   Smokeless tobacco: Never Used  Substance Use Topics   Alcohol use: No    Alcohol/week: 0.0 standard drinks   Drug use: No     Family Hx: The patient's family history includes Cancer in his mother.  ROS:   Please see the history of present illness.     All other systems reviewed and are negative.   Prior CV studies:   The following studies were reviewed today:  Echo 05/29/15: Study Conclusions  - Left ventricle: The cavity size was normal. There was mild  concentric hypertrophy. Systolic function was normal. The  estimated ejection fraction was in the range of 55% to 60%. Wall  motion was normal; there were no regional wall motion  abnormalities. - Aortic valve: Trileaflet; mildly thickened, mildly calcified  leaflets. - Mitral valve: Calcified annulus. There was mild regurgitation. - Right ventricle: Systolic function was mildly reduced. - Pulmonic valve: There was trivial regurgitation. - Pulmonary arteries: PA peak pressure: 57 mm Hg (S).  Impressions:  - The right ventricular systolic pressure was increased consistent  with moderate pulmonary hypertension.  Echo 11/07/15: LVEF 60-65%.  Grade 1 diastolic dysfunction. Mild tricuspid regurgitation.  PASP 31 mmHg.  Labs/Other Tests and Data Reviewed:    EKG:  An ECG dated 07/18/17 was personally reviewed today and demonstrated:  sinus rhythm.  Rate 82 bpm.  Nonspecific ST-T changes.  Recent Labs: 05/07/2018: ALT 15; BUN 13; Creatinine, Ser 1.06; Potassium 5.2; Sodium 135   Recent Lipid  Panel Lab Results  Component Value Date/Time   CHOL 112 05/07/2018 11:11 AM   TRIG 117 05/07/2018 11:11 AM   HDL 34 (L) 05/07/2018 11:11 AM   CHOLHDL 3.3 05/07/2018 11:11 AM   CHOLHDL 4.9 11/21/2015 08:49 AM   LDLCALC 55 05/07/2018 11:11 AM    Wt Readings from Last 3 Encounters:  04/21/18 (!) 428 lb 9.6 oz (194.4 kg)  07/23/17 (!) 368 lb 12.8 oz (167.3 kg)  07/18/17 (!) 387 lb (175.5 kg)     Objective:    There were no vitals taken for this visit. GENERAL: Breathing unlabored.  No acute distress. NEURO:  Speech fluent.  PSYCH:  Cognitively intact, oriented to person place and time  ASSESSMENT & PLAN:    # Paroxysmal atrial fibrillation: Scott Shelton remains in sinus rhythm.  He has not experienced any recent palpitations.  Continue warfarin and carvedilol.  INR managed by [facility].  He  will touch base with them regarding monitoring as he is due.  This patients CHA2DS2-VASc Score and unadjusted Ischemic Stroke Rate (% per year) is equal to 0.6 % stroke rate/year from a score of 1 Above score calculated as 1 point each if present [CHF, HTN, DM, Vascular=MI/PAD/Aortic Plaque, Age if 65-74, or Male] Above score calculated as 2 points each if present [Age > 75, or Stroke/TIA/TE]  # OSA: Patient requesting new machine.  It has been many years since his sleep study.  We will order a new sleep study to assess what type of machine is needed.  # Lymphedema:  Chronic problem that is stable.  He has more money and would like to get a pneumatic compression device.  This will be helpful in addition to his compression stockings.   # Hypertension: Patient unable to check his BP today but reports it has been generally controlled.  He will continue to monitor. Continue carvedilol and lasix.   # Pulmonary hypertension: Resolved.  COVID-19 Education: The signs and symptoms of COVID-19 were discussed with the patient and how to seek care for testing (follow up with PCP or  arrange E-visit).  The importance of social distancing was discussed today.  Time:   Today, I have spent 25 minutes with the patient with telehealth technology discussing the above problems.     Medication Adjustments/Labs and Tests Ordered: Current medicines are reviewed at length with the patient today.  Concerns regarding medicines are outlined above.   Tests Ordered: No orders of the defined types were placed in this encounter.   Medication Changes: No orders of the defined types were placed in this encounter.   Disposition:  Follow up in 1 year(s)  Signed, Skeet Latch, MD  07/29/2018 11:44 AM    [place]

## 2018-07-29 NOTE — Telephone Encounter (Signed)
-----   Message from Earvin Hansen, LPN sent at 5/52/1747 11:27 AM EDT ----- HEY  Dr Oval Linsey had visit today with Mr Njoku and was wanting you to reach out to him about a new CPAP machine. He may need updated sleep study but she said you would know. I know that wont be done right away but just let me know if I need to do anything Thanks Rip Harbour

## 2018-08-03 ENCOUNTER — Ambulatory Visit: Payer: Medicare HMO | Attending: Family Medicine | Admitting: Family Medicine

## 2018-08-03 ENCOUNTER — Encounter: Payer: Self-pay | Admitting: Family Medicine

## 2018-08-03 ENCOUNTER — Other Ambulatory Visit: Payer: Self-pay

## 2018-08-03 ENCOUNTER — Telehealth: Payer: Self-pay | Admitting: Pharmacist

## 2018-08-03 DIAGNOSIS — F329 Major depressive disorder, single episode, unspecified: Secondary | ICD-10-CM | POA: Diagnosis not present

## 2018-08-03 DIAGNOSIS — F419 Anxiety disorder, unspecified: Secondary | ICD-10-CM | POA: Diagnosis not present

## 2018-08-03 DIAGNOSIS — I10 Essential (primary) hypertension: Secondary | ICD-10-CM | POA: Diagnosis not present

## 2018-08-03 DIAGNOSIS — E876 Hypokalemia: Secondary | ICD-10-CM

## 2018-08-03 DIAGNOSIS — I890 Lymphedema, not elsewhere classified: Secondary | ICD-10-CM

## 2018-08-03 DIAGNOSIS — E7800 Pure hypercholesterolemia, unspecified: Secondary | ICD-10-CM

## 2018-08-03 MED ORDER — CARVEDILOL 12.5 MG PO TABS: 12.5000 mg | ORAL_TABLET | Freq: Two times a day (BID) | ORAL | 1 refills | Status: AC

## 2018-08-03 MED ORDER — POTASSIUM CHLORIDE ER 20 MEQ PO TBCR: 20.0000 meq | EXTENDED_RELEASE_TABLET | Freq: Every day | ORAL | 1 refills | Status: AC

## 2018-08-03 MED ORDER — SERTRALINE HCL 50 MG PO TABS: 50.0000 mg | ORAL_TABLET | Freq: Every day | ORAL | 1 refills | Status: AC

## 2018-08-03 MED ORDER — FUROSEMIDE 40 MG PO TABS: 40.0000 mg | ORAL_TABLET | Freq: Every day | ORAL | 1 refills | Status: AC

## 2018-08-03 MED ORDER — ATORVASTATIN CALCIUM 20 MG PO TABS: 20.0000 mg | ORAL_TABLET | Freq: Every day | ORAL | 1 refills | Status: AC

## 2018-08-03 NOTE — Telephone Encounter (Signed)
Call placed to patient. We've been trying to get him in for an INR check for some time. He was last seen on 05/21/18 and had his warfarin adjusted. He was instructed to see me on 06/11/2018 for follow-up, however, he cancelled that appointment.   Today, he reports that he uses [facility] and is unable to rely on them for his transportation needs amid the covid-19 pandemic. He has a caretaker that assists him with ADLs at home. She will bring him tomorrow at 10:00 am for an INR check.

## 2018-08-03 NOTE — Telephone Encounter (Signed)
Noted  

## 2018-08-03 NOTE — Progress Notes (Signed)
Virtual Visit via Telephone Note  I connected with Scott Shelton, on 08/03/2018 at 1:41 PM by telephone and verified that I am speaking with the correct person using two identifiers.   Consent: I discussed the limitations, risks, security and privacy concerns of performing an evaluation and management service by telephone and the availability of in person appointments. I also discussed with the patient that there may be a patient responsible charge related to this service. The patient expressed understanding and agreed to proceed.   Location of Patient: Home  Location of Provider: Clinic   Persons participating in Telemedicine visit: Daimon Kean Flythe  Ozzie Hoyle Dr. Margarita Rana - PCP     History of Present Illness: Scott Shelton is a 68 year old male with a history of hypertension, paroxysmal atrial fibrillation (on Coumadin), lymphedema, BPH, anxiety and depression. h/o pulmonary embolism, obstructive sleep apnea (on CPAP) seen for follow-up visit. His last INR was 3.4 on 05/21/2018 with the clinical pharmacist and he informs me today he is yet to come in for follow-up INR due to scares about the pandemic.  He currently does not drive and uses this For transportation.  Denies bruising or bleeding. He denies shortness of breath, chest pains or recent weight gain. He provides his vitals to me which he took at home: Blood pressure-129/69; temperature 97.5, pulse 72 He does have chronic lymphedema which he states has been stable. His last visit with cardiology was on 07/29/2018 and was a telemedicine visit.  No regimen changes were made at that time.   Past Medical History:  Diagnosis Date  . A-fib ([place])   . Bilateral leg edema   . Chronic kidney disease    STAGE III  . GERD (gastroesophageal reflux disease)   . History of cellulitis 05/21/15   BLE  . Hx pulmonary embolism    with hypoxic respiratory failure  . Pulmonary hypertension ([facility])    No Known  Allergies  Current Outpatient Medications on File Prior to Visit  Medication Sig Dispense Refill  . atorvastatin (LIPITOR) 20 MG tablet Take 1 tablet (20 mg total) by mouth daily. 90 tablet 1  . carvedilol (COREG) 12.5 MG tablet Take 1 tablet (12.5 mg total) by mouth 2 (two) times daily with a meal. 180 tablet 1  . finasteride (PROSCAR) 5 MG tablet Take 1 tablet (5 mg total) by mouth daily. 90 tablet 0  . furosemide (LASIX) 40 MG tablet Take 1 tablet (40 mg total) by mouth daily. 90 tablet 1  . Potassium Chloride ER 20 MEQ TBCR Take 20 mEq by mouth daily. 90 tablet 1  . sertraline (ZOLOFT) 50 MG tablet Take 1 tablet (50 mg total) by mouth daily. 90 tablet 1  . tamsulosin (FLOMAX) 0.4 MG CAPS capsule Take 1 capsule (0.4 mg total) by mouth daily. 90 capsule 0  . traZODone (DESYREL) 50 MG tablet Take 1 tablet (50 mg total) by mouth at bedtime. (Patient taking differently: Take 50 mg by mouth at bedtime as needed. ) 90 tablet 1  . warfarin (COUMADIN) 5 MG tablet TAKE 1 & 1/2 TABLETS BY MOUTH MONDAY, WEDNESDAY, & FRIDAY, & 1 TABLET DAILY ALL OTHER DAYS. 60 tablet 2   No current facility-administered medications on file prior to visit.     Observations/Objective: Awake, alert, oriented x3 Not in acute distress   CMP Latest Ref Rng & Units 05/07/2018 07/30/2017 01/28/2017  Glucose 65 - 99 mg/dL 104(H) 105(H) 101(H)  BUN 8 - 27 mg/dL 13 13 18  Creatinine 0.76 - 1.27 mg/dL 1.06 1.25 1.05  Sodium 134 - 144 mmol/L 135 142 139  Potassium 3.5 - 5.2 mmol/L 5.2 4.9 4.9  Chloride 96 - 106 mmol/L 97 106 100  CO2 20 - 29 mmol/L 26 22 25   Calcium 8.6 - 10.2 mg/dL 10.3(H) 10.2 10.2  Total Protein 6.0 - 8.5 g/dL 6.4 6.3 -  Total Bilirubin 0.0 - 1.2 mg/dL 0.3 0.3 -  Alkaline Phos 39 - 117 IU/L 116 136(H) -  AST 0 - 40 IU/L 13 16 -  ALT 0 - 44 IU/L 15 20 -    Lipid Panel     Component Value Date/Time   CHOL 112 05/07/2018 1111   TRIG 117 05/07/2018 1111   HDL 34 (L) 05/07/2018 1111   CHOLHDL 3.3  05/07/2018 1111   CHOLHDL 4.9 11/21/2015 0849   VLDL 44 (H) 11/21/2015 0849   LDLCALC 55 05/07/2018 1111     Assessment and Plan: 1. Pure hypercholesterolemia Controlled Low-cholesterol diet - atorvastatin (LIPITOR) 20 MG tablet; Take 1 tablet (20 mg total) by mouth daily.  Dispense: 90 tablet; Refill: 1  2. Essential hypertension Controlled - carvedilol (COREG) 12.5 MG tablet; Take 1 tablet (12.5 mg total) by mouth 2 (two) times daily with a meal.  Dispense: 180 tablet; Refill: 1  3. Lymphedema Use compression stockings - furosemide (LASIX) 40 MG tablet; Take 1 tablet (40 mg total) by mouth daily.  Dispense: 90 tablet; Refill: 1  4. Hypokalemia Last potassium was 5.2 - Potassium Chloride ER 20 MEQ TBCR; Take 20 mEq by mouth daily.  Dispense: 90 tablet; Refill: 1  5. Anxiety and depression Stable - sertraline (ZOLOFT) 50 MG tablet; Take 1 tablet (50 mg total) by mouth daily.  Dispense: 90 tablet; Refill: 1   Follow Up Instructions: Return in about 3 months (around 11/02/2018) for Medical conditions, call for appointment.    I discussed the assessment and treatment plan with the patient. The patient was provided an opportunity to ask questions and all were answered. The patient agreed with the plan and demonstrated an understanding of the instructions.   The patient was advised to call back or seek an in-person evaluation if the symptoms worsen or if the condition fails to improve as anticipated.     I provided 25 minutes total of non-face-to-face time during this encounter including median intraservice time, reviewing previous notes, labs, imaging, medications and explaining diagnosis and management.     Charlott Rakes, MD, FAAFP. [facility] and [place], [place]   08/03/2018, 1:41 PM

## 2018-08-03 NOTE — Progress Notes (Signed)
Patient has been called and DOB has been verified. Patient has been screened and transferred to PCP to start phone visit.  C/C:Hypertention.    

## 2018-08-04 ENCOUNTER — Ambulatory Visit: Payer: Medicare HMO | Attending: Family Medicine | Admitting: Pharmacist

## 2018-08-04 DIAGNOSIS — I2692 Saddle embolus of pulmonary artery without acute cor pulmonale: Secondary | ICD-10-CM | POA: Diagnosis not present

## 2018-08-04 DIAGNOSIS — I4891 Unspecified atrial fibrillation: Secondary | ICD-10-CM

## 2018-08-04 LAB — POCT INR: INR: 3.2 — AB (ref 2.0–3.0)

## 2018-08-06 ENCOUNTER — Telehealth: Payer: Self-pay | Admitting: Family Medicine

## 2018-08-06 NOTE — Telephone Encounter (Signed)
Called patient and LVM to return call and schedule a 3 month f/u with PCP.

## 2018-08-18 ENCOUNTER — Encounter: Payer: Medicare HMO | Admitting: Pharmacist

## 2018-08-24 ENCOUNTER — Encounter: Payer: Medicare HMO | Admitting: Pharmacist

## 2018-08-25 ENCOUNTER — Other Ambulatory Visit: Payer: Self-pay | Admitting: Pharmacist

## 2018-08-25 DIAGNOSIS — I2692 Saddle embolus of pulmonary artery without acute cor pulmonale: Secondary | ICD-10-CM

## 2018-08-25 DIAGNOSIS — I2782 Chronic pulmonary embolism: Secondary | ICD-10-CM

## 2018-08-25 MED ORDER — WARFARIN SODIUM 5 MG PO TABS: ORAL_TABLET | ORAL | 2 refills | Status: AC

## 2018-09-17 ENCOUNTER — Telehealth: Payer: Self-pay | Admitting: Pharmacist

## 2018-09-17 NOTE — Telephone Encounter (Signed)
Received call from patient. He is requesting to speak with Alycia or Dr. Margarita Rana about required forms for [employer] transportation.

## 2018-09-17 NOTE — Telephone Encounter (Signed)
Patient was called and informed that we have [employer] applications on site and all he needs to do is bring his portion to the clinic and we will fax off to [employer].

## 2018-09-23 ENCOUNTER — Ambulatory Visit: Payer: Medicare HMO | Attending: Family Medicine | Admitting: Pharmacist

## 2018-09-23 ENCOUNTER — Other Ambulatory Visit: Payer: Self-pay

## 2018-09-23 ENCOUNTER — Telehealth: Payer: Self-pay

## 2018-09-23 DIAGNOSIS — I2692 Saddle embolus of pulmonary artery without acute cor pulmonale: Secondary | ICD-10-CM

## 2018-09-23 DIAGNOSIS — I2782 Chronic pulmonary embolism: Secondary | ICD-10-CM

## 2018-09-23 DIAGNOSIS — I4891 Unspecified atrial fibrillation: Secondary | ICD-10-CM

## 2018-09-23 LAB — POCT INR: INR: 3.7 — AB (ref 2.0–3.0)

## 2018-09-23 NOTE — Telephone Encounter (Signed)
Met with the patient when he came to the clinic today and assisted him with completing a [employer] application.  The application was then faxed to [employer] eligibility

## 2018-10-07 ENCOUNTER — Encounter: Payer: Medicare HMO | Admitting: Pharmacist

## 2018-10-07 DEATH — deceased

## 2018-10-22 ENCOUNTER — Telehealth: Payer: Self-pay | Admitting: Family Medicine

## 2018-10-22 NOTE — Telephone Encounter (Signed)
Jacquelyne Balint at [employer] [phone] called to inform pt deceased on 2018-10-15. He states no person or family has given info or claimed the body so there is missing info on the death certificate such as occupation due to it being unknown and cause of death unknown. They are bringing the death certificate for Dr. Margarita Rana today.

## 2018-10-23 NOTE — Telephone Encounter (Signed)
Death Certificate has been received and a copy has been faxed over to [employer]. They have also been called and informed of death certificate being ready for pick up.

## 2018-11-16 ENCOUNTER — Ambulatory Visit: Payer: Medicare HMO | Admitting: Family Medicine
